# Patient Record
Sex: Male | Born: 1935 | Race: White | Hispanic: No | Smoking: Former smoker
Health system: Southern US, Community
[De-identification: ages and names within clinical notes are randomized; demographics above are authoritative.]

## PROBLEM LIST (undated history)

## (undated) DIAGNOSIS — Z9049 Acquired absence of other specified parts of digestive tract: Secondary | ICD-10-CM

## (undated) DIAGNOSIS — I639 Cerebral infarction, unspecified: Secondary | ICD-10-CM

## (undated) DIAGNOSIS — F329 Major depressive disorder, single episode, unspecified: Secondary | ICD-10-CM

## (undated) DIAGNOSIS — E785 Hyperlipidemia, unspecified: Secondary | ICD-10-CM

## (undated) DIAGNOSIS — I2510 Atherosclerotic heart disease of native coronary artery without angina pectoris: Secondary | ICD-10-CM

## (undated) DIAGNOSIS — R0989 Other specified symptoms and signs involving the circulatory and respiratory systems: Secondary | ICD-10-CM

## (undated) DIAGNOSIS — W19XXXA Unspecified fall, initial encounter: Secondary | ICD-10-CM

## (undated) DIAGNOSIS — I480 Paroxysmal atrial fibrillation: Secondary | ICD-10-CM

## (undated) DIAGNOSIS — F0390 Unspecified dementia without behavioral disturbance: Secondary | ICD-10-CM

## (undated) DIAGNOSIS — F32A Depression, unspecified: Secondary | ICD-10-CM

## (undated) DIAGNOSIS — I509 Heart failure, unspecified: Secondary | ICD-10-CM

## (undated) DIAGNOSIS — N189 Chronic kidney disease, unspecified: Secondary | ICD-10-CM

## (undated) DIAGNOSIS — N400 Enlarged prostate without lower urinary tract symptoms: Secondary | ICD-10-CM

## (undated) DIAGNOSIS — I10 Essential (primary) hypertension: Secondary | ICD-10-CM

## (undated) HISTORY — DX: Hyperlipidemia, unspecified: E78.5

## (undated) HISTORY — DX: Major depressive disorder, single episode, unspecified: F32.9

## (undated) HISTORY — DX: Chronic kidney disease, unspecified: N18.9

## (undated) HISTORY — DX: Atherosclerotic heart disease of native coronary artery without angina pectoris: I25.10

## (undated) HISTORY — PX: CORONARY ARTERY BYPASS GRAFT: SHX141

## (undated) HISTORY — DX: Acquired absence of other specified parts of digestive tract: Z90.49

## (undated) HISTORY — DX: Heart failure, unspecified: I50.9

## (undated) HISTORY — DX: Paroxysmal atrial fibrillation: I48.0

## (undated) HISTORY — DX: Essential (primary) hypertension: I10

## (undated) HISTORY — DX: Cerebral infarction, unspecified: I63.9

## (undated) HISTORY — DX: Unspecified fall, initial encounter: W19.XXXA

## (undated) HISTORY — DX: Depression, unspecified: F32.A

## (undated) HISTORY — DX: Benign prostatic hyperplasia without lower urinary tract symptoms: N40.0

## (undated) HISTORY — DX: Other specified symptoms and signs involving the circulatory and respiratory systems: R09.89

---

## 2003-04-22 ENCOUNTER — Emergency Department (HOSPITAL_COMMUNITY): Admission: EM | Admit: 2003-04-22 | Discharge: 2003-04-22 | Payer: Self-pay | Admitting: Emergency Medicine

## 2004-01-19 ENCOUNTER — Ambulatory Visit: Payer: Self-pay | Admitting: *Deleted

## 2004-02-06 ENCOUNTER — Emergency Department (HOSPITAL_COMMUNITY): Admission: EM | Admit: 2004-02-06 | Discharge: 2004-02-06 | Payer: Self-pay | Admitting: Emergency Medicine

## 2004-03-19 ENCOUNTER — Ambulatory Visit: Payer: Self-pay | Admitting: Internal Medicine

## 2004-05-03 ENCOUNTER — Ambulatory Visit: Payer: Self-pay | Admitting: Internal Medicine

## 2004-05-17 ENCOUNTER — Ambulatory Visit: Payer: Self-pay | Admitting: *Deleted

## 2004-08-18 ENCOUNTER — Ambulatory Visit: Payer: Self-pay | Admitting: Internal Medicine

## 2004-09-20 ENCOUNTER — Ambulatory Visit: Payer: Self-pay | Admitting: Internal Medicine

## 2004-09-27 ENCOUNTER — Ambulatory Visit: Payer: Self-pay

## 2004-11-18 ENCOUNTER — Ambulatory Visit: Payer: Self-pay | Admitting: Internal Medicine

## 2005-01-21 ENCOUNTER — Emergency Department (HOSPITAL_COMMUNITY): Admission: EM | Admit: 2005-01-21 | Discharge: 2005-01-21 | Payer: Self-pay | Admitting: Emergency Medicine

## 2005-04-04 ENCOUNTER — Ambulatory Visit: Payer: Self-pay | Admitting: Internal Medicine

## 2005-05-23 ENCOUNTER — Ambulatory Visit: Payer: Self-pay | Admitting: Internal Medicine

## 2005-06-28 ENCOUNTER — Ambulatory Visit: Payer: Self-pay | Admitting: Internal Medicine

## 2005-07-07 ENCOUNTER — Ambulatory Visit: Payer: Self-pay | Admitting: Internal Medicine

## 2005-08-30 ENCOUNTER — Ambulatory Visit: Payer: Self-pay | Admitting: Internal Medicine

## 2005-11-30 ENCOUNTER — Ambulatory Visit: Payer: Self-pay | Admitting: Internal Medicine

## 2005-12-22 ENCOUNTER — Ambulatory Visit: Payer: Self-pay | Admitting: Internal Medicine

## 2005-12-29 ENCOUNTER — Ambulatory Visit: Payer: Self-pay | Admitting: Internal Medicine

## 2006-01-13 ENCOUNTER — Ambulatory Visit: Payer: Self-pay | Admitting: Cardiology

## 2006-01-18 ENCOUNTER — Ambulatory Visit: Payer: Self-pay | Admitting: Endocrinology

## 2006-02-08 ENCOUNTER — Ambulatory Visit: Payer: Self-pay | Admitting: Endocrinology

## 2006-02-16 ENCOUNTER — Ambulatory Visit: Payer: Self-pay | Admitting: Cardiology

## 2006-02-24 ENCOUNTER — Ambulatory Visit: Payer: Self-pay | Admitting: Internal Medicine

## 2006-03-01 ENCOUNTER — Ambulatory Visit: Payer: Self-pay | Admitting: Endocrinology

## 2006-03-09 ENCOUNTER — Inpatient Hospital Stay (HOSPITAL_COMMUNITY): Admission: EM | Admit: 2006-03-09 | Discharge: 2006-03-15 | Payer: Self-pay | Admitting: Emergency Medicine

## 2006-03-09 ENCOUNTER — Ambulatory Visit: Payer: Self-pay | Admitting: Internal Medicine

## 2006-03-28 ENCOUNTER — Ambulatory Visit: Payer: Self-pay | Admitting: Internal Medicine

## 2006-03-28 LAB — CONVERTED CEMR LAB: Calcium, Total (PTH): 9.1 mg/dL (ref 8.4–10.5)

## 2006-04-03 ENCOUNTER — Ambulatory Visit: Payer: Self-pay | Admitting: Internal Medicine

## 2006-04-07 ENCOUNTER — Ambulatory Visit: Payer: Self-pay | Admitting: Internal Medicine

## 2006-04-07 LAB — CONVERTED CEMR LAB
ALT: 22 units/L (ref 0–40)
AST: 23 units/L (ref 0–37)
Alkaline Phosphatase: 40 units/L (ref 39–117)
BUN: 20 mg/dL (ref 6–23)
Bilirubin, Direct: 0.1 mg/dL (ref 0.0–0.3)
CO2: 25 meq/L (ref 19–32)
Calcium: 9.2 mg/dL (ref 8.4–10.5)
Chloride: 108 meq/L (ref 96–112)
Cholesterol: 109 mg/dL (ref 0–200)
GFR calc non Af Amer: 62 mL/min
Glucose, Bld: 168 mg/dL — ABNORMAL HIGH (ref 70–99)
Total Protein: 6.6 g/dL (ref 6.0–8.3)

## 2006-04-25 ENCOUNTER — Ambulatory Visit: Payer: Self-pay | Admitting: Endocrinology

## 2006-05-31 ENCOUNTER — Ambulatory Visit: Payer: Self-pay | Admitting: Endocrinology

## 2006-06-29 ENCOUNTER — Encounter: Payer: Self-pay | Admitting: Endocrinology

## 2006-06-29 DIAGNOSIS — I10 Essential (primary) hypertension: Secondary | ICD-10-CM | POA: Insufficient documentation

## 2006-06-29 DIAGNOSIS — E119 Type 2 diabetes mellitus without complications: Secondary | ICD-10-CM

## 2006-06-29 DIAGNOSIS — I679 Cerebrovascular disease, unspecified: Secondary | ICD-10-CM

## 2006-06-29 DIAGNOSIS — I509 Heart failure, unspecified: Secondary | ICD-10-CM | POA: Insufficient documentation

## 2006-06-30 ENCOUNTER — Ambulatory Visit: Payer: Self-pay | Admitting: Endocrinology

## 2006-07-07 ENCOUNTER — Ambulatory Visit: Payer: Self-pay | Admitting: Internal Medicine

## 2006-07-17 ENCOUNTER — Ambulatory Visit: Payer: Self-pay | Admitting: Endocrinology

## 2006-08-02 ENCOUNTER — Encounter: Payer: Self-pay | Admitting: Internal Medicine

## 2006-08-02 ENCOUNTER — Ambulatory Visit: Payer: Self-pay

## 2006-08-02 ENCOUNTER — Ambulatory Visit: Payer: Self-pay | Admitting: Internal Medicine

## 2006-08-02 LAB — CONVERTED CEMR LAB
ALT: 39 units/L (ref 0–40)
Albumin: 3.7 g/dL (ref 3.5–5.2)
Alkaline Phosphatase: 72 units/L (ref 39–117)
BUN: 24 mg/dL — ABNORMAL HIGH (ref 6–23)
CO2: 29 meq/L (ref 19–32)
Calcium: 9.3 mg/dL (ref 8.4–10.5)
GFR calc Af Amer: 62 mL/min
Potassium: 4 meq/L (ref 3.5–5.1)
Total CHOL/HDL Ratio: 4.4
Total Protein: 7.1 g/dL (ref 6.0–8.3)
VLDL: 29 mg/dL (ref 0–40)

## 2006-08-03 ENCOUNTER — Ambulatory Visit: Payer: Self-pay | Admitting: Internal Medicine

## 2006-08-08 ENCOUNTER — Ambulatory Visit: Payer: Self-pay | Admitting: Cardiology

## 2006-08-08 LAB — CONVERTED CEMR LAB
CO2: 28 meq/L (ref 19–32)
Calcium: 9.1 mg/dL (ref 8.4–10.5)
Chloride: 103 meq/L (ref 96–112)
Creatinine, Ser: 1.6 mg/dL — ABNORMAL HIGH (ref 0.4–1.5)
GFR calc non Af Amer: 44 mL/min
Glucose, Bld: 128 mg/dL — ABNORMAL HIGH (ref 70–99)
Sodium: 138 meq/L (ref 135–145)

## 2006-08-09 ENCOUNTER — Ambulatory Visit: Payer: Self-pay | Admitting: Endocrinology

## 2006-08-09 LAB — CONVERTED CEMR LAB
Hgb A1c MFr Bld: 7.8 % — ABNORMAL HIGH (ref 4.6–6.0)
Microalb Creat Ratio: 114.6 mg/g — ABNORMAL HIGH (ref 0.0–30.0)
Microalb, Ur: 7.3 mg/dL — ABNORMAL HIGH (ref 0.0–1.9)

## 2006-08-31 ENCOUNTER — Other Ambulatory Visit: Payer: Self-pay | Admitting: Emergency Medicine

## 2006-08-31 ENCOUNTER — Ambulatory Visit (HOSPITAL_COMMUNITY): Admission: RE | Admit: 2006-08-31 | Discharge: 2006-08-31 | Payer: Self-pay | Admitting: Urology

## 2006-09-01 ENCOUNTER — Ambulatory Visit: Payer: Self-pay | Admitting: Vascular Surgery

## 2006-09-01 ENCOUNTER — Ambulatory Visit: Payer: Self-pay | Admitting: Internal Medicine

## 2006-09-01 ENCOUNTER — Encounter (INDEPENDENT_AMBULATORY_CARE_PROVIDER_SITE_OTHER): Payer: Self-pay | Admitting: Internal Medicine

## 2006-09-01 ENCOUNTER — Observation Stay (HOSPITAL_COMMUNITY): Admission: EM | Admit: 2006-09-01 | Discharge: 2006-09-01 | Payer: Self-pay | Admitting: Internal Medicine

## 2006-09-19 ENCOUNTER — Ambulatory Visit: Payer: Self-pay | Admitting: Cardiology

## 2006-10-06 ENCOUNTER — Ambulatory Visit: Payer: Self-pay | Admitting: Internal Medicine

## 2006-10-06 LAB — CONVERTED CEMR LAB
ALT: 14 units/L (ref 0–53)
AST: 19 units/L (ref 0–37)
Bilirubin, Direct: 0.1 mg/dL (ref 0.0–0.3)
Cholesterol: 95 mg/dL (ref 0–200)
HDL: 38.6 mg/dL — ABNORMAL LOW (ref >39.0)
Total Bilirubin: 0.6 mg/dL (ref 0.3–1.2)
Triglycerides: 72 mg/dL (ref 0–149)
VLDL: 14 mg/dL (ref 0–40)

## 2006-10-09 ENCOUNTER — Ambulatory Visit (HOSPITAL_BASED_OUTPATIENT_CLINIC_OR_DEPARTMENT_OTHER): Admission: RE | Admit: 2006-10-09 | Discharge: 2006-10-10 | Payer: Self-pay | Admitting: Urology

## 2006-10-18 ENCOUNTER — Ambulatory Visit: Payer: Self-pay | Admitting: Internal Medicine

## 2006-10-23 ENCOUNTER — Ambulatory Visit: Payer: Self-pay

## 2006-10-23 ENCOUNTER — Ambulatory Visit: Payer: Self-pay | Admitting: Cardiology

## 2006-10-27 ENCOUNTER — Ambulatory Visit: Payer: Self-pay | Admitting: Cardiology

## 2006-11-03 ENCOUNTER — Ambulatory Visit: Payer: Self-pay | Admitting: Cardiology

## 2006-11-16 ENCOUNTER — Ambulatory Visit: Payer: Self-pay | Admitting: Endocrinology

## 2006-11-16 ENCOUNTER — Encounter: Payer: Self-pay | Admitting: Endocrinology

## 2006-11-16 LAB — CONVERTED CEMR LAB: Hgb A1c MFr Bld: 7.7 % — ABNORMAL HIGH (ref 4.6–6.0)

## 2006-11-17 ENCOUNTER — Ambulatory Visit: Payer: Self-pay | Admitting: Internal Medicine

## 2006-11-23 ENCOUNTER — Ambulatory Visit: Payer: Self-pay | Admitting: Cardiology

## 2006-11-23 LAB — CONVERTED CEMR LAB
BUN: 20 mg/dL (ref 6–23)
CO2: 30 meq/L (ref 19–32)
Creatinine, Ser: 1.5 mg/dL (ref 0.4–1.5)
GFR calc Af Amer: 58 mL/min
Glucose, Bld: 112 mg/dL — ABNORMAL HIGH (ref 70–99)
Potassium: 3.8 meq/L (ref 3.5–5.1)

## 2006-12-08 ENCOUNTER — Ambulatory Visit: Payer: Self-pay | Admitting: Cardiovascular Disease

## 2006-12-20 ENCOUNTER — Ambulatory Visit: Payer: Self-pay | Admitting: Cardiology

## 2007-01-03 ENCOUNTER — Ambulatory Visit: Payer: Self-pay | Admitting: Cardiology

## 2007-01-19 ENCOUNTER — Ambulatory Visit: Payer: Self-pay | Admitting: Internal Medicine

## 2007-01-19 ENCOUNTER — Ambulatory Visit: Payer: Self-pay | Admitting: Cardiology

## 2007-01-24 ENCOUNTER — Ambulatory Visit: Payer: Self-pay | Admitting: Internal Medicine

## 2007-01-24 LAB — CONVERTED CEMR LAB
Albumin: 3.6 g/dL (ref 3.5–5.2)
Bilirubin, Direct: 0.2 mg/dL (ref 0.0–0.3)
Calcium: 9.4 mg/dL (ref 8.4–10.5)
Chloride: 95 meq/L — ABNORMAL LOW (ref 96–112)
Cholesterol: 120 mg/dL (ref 0–200)
GFR calc Af Amer: 44 mL/min
GFR calc non Af Amer: 36 mL/min
Glucose, Bld: 169 mg/dL — ABNORMAL HIGH (ref 70–99)
HDL: 40.4 mg/dL (ref >39.0)
LDL Cholesterol: 53 mg/dL (ref 0–99)
Sodium: 137 meq/L (ref 135–145)
Total CHOL/HDL Ratio: 3
VLDL: 27 mg/dL (ref 0–40)

## 2007-02-05 ENCOUNTER — Ambulatory Visit: Payer: Self-pay | Admitting: Cardiology

## 2007-02-05 ENCOUNTER — Ambulatory Visit: Payer: Self-pay | Admitting: Internal Medicine

## 2007-02-05 LAB — CONVERTED CEMR LAB
BUN: 33 mg/dL — ABNORMAL HIGH (ref 6–23)
CO2: 30 meq/L (ref 19–32)
Chloride: 99 meq/L (ref 96–112)
Creatinine, Ser: 1.7 mg/dL — ABNORMAL HIGH (ref 0.4–1.5)

## 2007-02-20 ENCOUNTER — Ambulatory Visit: Payer: Self-pay | Admitting: Endocrinology

## 2007-02-21 LAB — CONVERTED CEMR LAB: Hgb A1c MFr Bld: 8 % — ABNORMAL HIGH (ref 4.6–6.0)

## 2007-02-26 ENCOUNTER — Ambulatory Visit: Payer: Self-pay | Admitting: Cardiology

## 2007-03-08 ENCOUNTER — Ambulatory Visit: Payer: Self-pay | Admitting: Internal Medicine

## 2007-03-08 LAB — CONVERTED CEMR LAB
BUN: 24 mg/dL — ABNORMAL HIGH (ref 6–23)
CO2: 30 meq/L (ref 19–32)
Calcium: 9.3 mg/dL (ref 8.4–10.5)
Chloride: 104 meq/L (ref 96–112)
Creatinine, Ser: 2 mg/dL — ABNORMAL HIGH (ref 0.4–1.5)
Pro B Natriuretic peptide (BNP): 153 pg/mL — ABNORMAL HIGH (ref 0.0–100.0)

## 2007-03-12 ENCOUNTER — Ambulatory Visit: Payer: Self-pay | Admitting: Cardiology

## 2007-03-15 ENCOUNTER — Ambulatory Visit: Payer: Self-pay | Admitting: Internal Medicine

## 2007-03-15 LAB — CONVERTED CEMR LAB
BUN: 23 mg/dL (ref 6–23)
Calcium: 9.5 mg/dL (ref 8.4–10.5)
Chloride: 103 meq/L (ref 96–112)
Creatinine, Ser: 1.5 mg/dL (ref 0.4–1.5)
Potassium: 3.7 meq/L (ref 3.5–5.1)
Pro B Natriuretic peptide (BNP): 125 pg/mL — ABNORMAL HIGH (ref 0.0–100.0)

## 2007-03-20 ENCOUNTER — Ambulatory Visit: Payer: Self-pay | Admitting: Internal Medicine

## 2007-03-20 ENCOUNTER — Inpatient Hospital Stay (HOSPITAL_COMMUNITY): Admission: AD | Admit: 2007-03-20 | Discharge: 2007-03-23 | Payer: Self-pay | Admitting: Internal Medicine

## 2007-03-21 ENCOUNTER — Encounter: Payer: Self-pay | Admitting: Internal Medicine

## 2007-03-27 ENCOUNTER — Ambulatory Visit: Payer: Self-pay | Admitting: Internal Medicine

## 2007-03-27 ENCOUNTER — Inpatient Hospital Stay (HOSPITAL_COMMUNITY): Admission: RE | Admit: 2007-03-27 | Discharge: 2007-03-28 | Payer: Self-pay | Admitting: Internal Medicine

## 2007-04-03 ENCOUNTER — Ambulatory Visit: Payer: Self-pay | Admitting: Endocrinology

## 2007-04-05 ENCOUNTER — Ambulatory Visit: Payer: Self-pay | Admitting: Cardiology

## 2007-04-08 ENCOUNTER — Encounter: Payer: Self-pay | Admitting: Endocrinology

## 2007-04-11 ENCOUNTER — Ambulatory Visit: Payer: Self-pay | Admitting: Internal Medicine

## 2007-04-11 LAB — CONVERTED CEMR LAB
BUN: 34 mg/dL — ABNORMAL HIGH (ref 6–23)
Calcium: 9.5 mg/dL (ref 8.4–10.5)
Chloride: 103 meq/L (ref 96–112)
Creatinine, Ser: 2.3 mg/dL — ABNORMAL HIGH (ref 0.4–1.5)
Potassium: 4.3 meq/L (ref 3.5–5.1)
Pro B Natriuretic peptide (BNP): 146 pg/mL — ABNORMAL HIGH (ref 0.0–100.0)

## 2007-04-25 ENCOUNTER — Ambulatory Visit: Payer: Self-pay | Admitting: Cardiology

## 2007-04-25 ENCOUNTER — Ambulatory Visit: Payer: Self-pay | Admitting: Internal Medicine

## 2007-04-25 LAB — CONVERTED CEMR LAB
BUN: 25 mg/dL — ABNORMAL HIGH (ref 6–23)
Calcium: 9.6 mg/dL (ref 8.4–10.5)
Creatinine, Ser: 1.7 mg/dL — ABNORMAL HIGH (ref 0.4–1.5)
GFR calc Af Amer: 50 mL/min
Potassium: 4.1 meq/L (ref 3.5–5.1)
Pro B Natriuretic peptide (BNP): 243 pg/mL — ABNORMAL HIGH (ref 0.0–100.0)

## 2007-05-10 ENCOUNTER — Ambulatory Visit: Payer: Self-pay | Admitting: Internal Medicine

## 2007-05-17 ENCOUNTER — Ambulatory Visit: Payer: Self-pay | Admitting: Internal Medicine

## 2007-05-17 LAB — CONVERTED CEMR LAB
AST: 18 units/L (ref 0–37)
Albumin: 4 g/dL (ref 3.5–5.2)
Alkaline Phosphatase: 35 units/L — ABNORMAL LOW (ref 39–117)
BUN: 46 mg/dL — ABNORMAL HIGH (ref 6–23)
Chloride: 101 meq/L (ref 96–112)
Cholesterol: 100 mg/dL (ref 0–200)
Glucose, Bld: 107 mg/dL — ABNORMAL HIGH (ref 70–99)
Potassium: 4.2 meq/L (ref 3.5–5.1)
Total Protein: 7.2 g/dL (ref 6.0–8.3)
VLDL: 19 mg/dL (ref 0–40)

## 2007-06-01 ENCOUNTER — Ambulatory Visit: Payer: Self-pay | Admitting: Internal Medicine

## 2007-06-01 ENCOUNTER — Ambulatory Visit: Payer: Self-pay

## 2007-06-01 LAB — CONVERTED CEMR LAB
BUN: 34 mg/dL — ABNORMAL HIGH (ref 6–23)
Creatinine, Ser: 2.1 mg/dL — ABNORMAL HIGH (ref 0.4–1.5)
GFR calc Af Amer: 39 mL/min
GFR calc non Af Amer: 32 mL/min

## 2007-06-04 ENCOUNTER — Emergency Department (HOSPITAL_COMMUNITY): Admission: EM | Admit: 2007-06-04 | Discharge: 2007-06-05 | Payer: Self-pay | Admitting: Emergency Medicine

## 2007-06-12 ENCOUNTER — Ambulatory Visit: Payer: Self-pay | Admitting: Internal Medicine

## 2007-06-12 ENCOUNTER — Ambulatory Visit: Payer: Self-pay | Admitting: Cardiology

## 2007-06-22 ENCOUNTER — Ambulatory Visit: Payer: Self-pay | Admitting: Cardiology

## 2007-06-22 LAB — CONVERTED CEMR LAB
Calcium: 9.4 mg/dL (ref 8.4–10.5)
Creatinine, Ser: 1.7 mg/dL — ABNORMAL HIGH (ref 0.4–1.5)
GFR calc non Af Amer: 41 mL/min
Pro B Natriuretic peptide (BNP): 329 pg/mL — ABNORMAL HIGH (ref 0.0–100.0)

## 2007-07-02 ENCOUNTER — Ambulatory Visit: Payer: Self-pay | Admitting: Cardiology

## 2007-07-11 ENCOUNTER — Ambulatory Visit: Payer: Self-pay | Admitting: Internal Medicine

## 2007-08-07 ENCOUNTER — Ambulatory Visit: Payer: Self-pay | Admitting: Cardiovascular Disease

## 2007-08-27 ENCOUNTER — Ambulatory Visit: Payer: Self-pay | Admitting: Internal Medicine

## 2007-08-27 LAB — CONVERTED CEMR LAB
CO2: 31 meq/L (ref 19–32)
Calcium: 9.2 mg/dL (ref 8.4–10.5)
Creatinine, Ser: 1.4 mg/dL (ref 0.4–1.5)
GFR calc non Af Amer: 51 mL/min
Pro B Natriuretic peptide (BNP): 237 pg/mL — ABNORMAL HIGH (ref 0.0–100.0)

## 2007-09-06 ENCOUNTER — Ambulatory Visit: Payer: Self-pay | Admitting: Internal Medicine

## 2007-09-07 LAB — CONVERTED CEMR LAB
BUN: 31 mg/dL — ABNORMAL HIGH (ref 6–23)
CO2: 26 meq/L (ref 19–32)
Calcium: 9.6 mg/dL (ref 8.4–10.5)
Creatinine, Ser: 1.4 mg/dL (ref 0.4–1.5)
Pro B Natriuretic peptide (BNP): 168 pg/mL — ABNORMAL HIGH (ref 0.0–100.0)

## 2007-09-18 ENCOUNTER — Ambulatory Visit: Payer: Self-pay

## 2007-09-18 ENCOUNTER — Ambulatory Visit: Payer: Self-pay | Admitting: Cardiology

## 2007-09-18 ENCOUNTER — Ambulatory Visit: Payer: Self-pay | Admitting: Internal Medicine

## 2007-09-18 LAB — CONVERTED CEMR LAB
Albumin: 3.9 g/dL (ref 3.5–5.2)
Alkaline Phosphatase: 41 units/L (ref 39–117)
Bilirubin, Direct: 0.1 mg/dL (ref 0.0–0.3)
GFR calc Af Amer: 62 mL/min
GFR calc non Af Amer: 51 mL/min
Glucose, Bld: 106 mg/dL — ABNORMAL HIGH (ref 70–99)
Potassium: 3.8 meq/L (ref 3.5–5.1)
Sodium: 143 meq/L (ref 135–145)
Total Bilirubin: 0.9 mg/dL (ref 0.3–1.2)
Total CHOL/HDL Ratio: 2.7
VLDL: 19 mg/dL (ref 0–40)

## 2007-10-18 ENCOUNTER — Ambulatory Visit: Payer: Self-pay | Admitting: Cardiology

## 2007-11-13 ENCOUNTER — Ambulatory Visit: Payer: Self-pay | Admitting: Internal Medicine

## 2007-12-12 ENCOUNTER — Ambulatory Visit: Payer: Self-pay | Admitting: Cardiovascular Disease

## 2007-12-12 LAB — CONVERTED CEMR LAB
BUN: 25 mg/dL — ABNORMAL HIGH (ref 6–23)
CO2: 32 meq/L (ref 19–32)
Chloride: 103 meq/L (ref 96–112)
Creatinine, Ser: 1.4 mg/dL (ref 0.4–1.5)
Glucose, Bld: 234 mg/dL — ABNORMAL HIGH (ref 70–99)
Potassium: 4 meq/L (ref 3.5–5.1)

## 2007-12-21 ENCOUNTER — Ambulatory Visit: Payer: Self-pay | Admitting: Cardiology

## 2007-12-21 LAB — CONVERTED CEMR LAB
BUN: 22 mg/dL (ref 6–23)
CO2: 29 meq/L (ref 19–32)
Chloride: 105 meq/L (ref 96–112)
Creatinine, Ser: 1.3 mg/dL (ref 0.4–1.5)
Potassium: 4.2 meq/L (ref 3.5–5.1)

## 2008-01-01 ENCOUNTER — Ambulatory Visit: Payer: Self-pay | Admitting: Cardiovascular Disease

## 2008-01-21 ENCOUNTER — Ambulatory Visit: Payer: Self-pay | Admitting: Internal Medicine

## 2008-02-21 ENCOUNTER — Ambulatory Visit: Payer: Self-pay | Admitting: Cardiovascular Disease

## 2008-02-21 ENCOUNTER — Ambulatory Visit: Payer: Self-pay | Admitting: Internal Medicine

## 2008-02-25 ENCOUNTER — Emergency Department (HOSPITAL_COMMUNITY): Admission: EM | Admit: 2008-02-25 | Discharge: 2008-02-25 | Payer: Self-pay | Admitting: Emergency Medicine

## 2008-02-25 ENCOUNTER — Ambulatory Visit: Payer: Self-pay | Admitting: Internal Medicine

## 2008-03-06 ENCOUNTER — Ambulatory Visit: Payer: Self-pay | Admitting: Cardiology

## 2008-03-06 LAB — CONVERTED CEMR LAB
Bilirubin Urine: NEGATIVE
Ketones, ur: NEGATIVE mg/dL
Nitrite: NEGATIVE
Specific Gravity, Urine: 1.015 (ref 1.000–1.03)
Total Protein, Urine: NEGATIVE mg/dL
pH: 5 (ref 5.0–8.0)

## 2008-03-27 ENCOUNTER — Ambulatory Visit: Payer: Self-pay | Admitting: Cardiology

## 2008-04-23 ENCOUNTER — Ambulatory Visit: Payer: Self-pay | Admitting: Cardiovascular Disease

## 2008-04-23 LAB — CONVERTED CEMR LAB
BUN: 34 mg/dL — ABNORMAL HIGH (ref 6–23)
Basophils Relative: 0.9 % (ref 0.0–3.0)
Chloride: 103 meq/L (ref 96–112)
Creatinine, Ser: 2.2 mg/dL — ABNORMAL HIGH (ref 0.4–1.5)
Eosinophils Relative: 0.9 % (ref 0.0–5.0)
GFR calc Af Amer: 37 mL/min
GFR calc non Af Amer: 31 mL/min
HCT: 36 % — ABNORMAL LOW (ref 39.0–52.0)
INR: 4.4 — ABNORMAL HIGH (ref 0.8–1.0)
MCV: 84.4 fL (ref 78.0–100.0)
Monocytes Relative: 5.1 % (ref 3.0–12.0)
Neutrophils Relative %: 73.3 % (ref 43.0–77.0)
Platelets: 70 10*3/uL — ABNORMAL LOW (ref 150–400)
RBC: 4.27 M/uL (ref 4.22–5.81)
WBC: 10.8 10*3/uL — ABNORMAL HIGH (ref 4.5–10.5)

## 2008-07-15 ENCOUNTER — Encounter: Payer: Self-pay | Admitting: Cardiology

## 2008-07-15 ENCOUNTER — Encounter: Payer: Self-pay | Admitting: *Deleted

## 2008-07-15 LAB — CONVERTED CEMR LAB: INR: 2.1

## 2008-08-12 ENCOUNTER — Encounter: Payer: Self-pay | Admitting: Cardiology

## 2008-08-19 ENCOUNTER — Encounter: Payer: Self-pay | Admitting: Cardiology

## 2008-08-19 LAB — CONVERTED CEMR LAB: Prothrombin Time: 29.7 s

## 2008-08-20 ENCOUNTER — Encounter: Payer: Self-pay | Admitting: *Deleted

## 2008-08-22 ENCOUNTER — Telehealth (INDEPENDENT_AMBULATORY_CARE_PROVIDER_SITE_OTHER): Payer: Self-pay | Admitting: Nurse Practitioner

## 2008-08-27 ENCOUNTER — Encounter: Payer: Self-pay | Admitting: Internal Medicine

## 2008-08-27 LAB — CONVERTED CEMR LAB: POC INR: 2.9

## 2008-09-03 ENCOUNTER — Ambulatory Visit: Payer: Self-pay | Admitting: Internal Medicine

## 2008-09-03 ENCOUNTER — Encounter: Payer: Self-pay | Admitting: Physician Assistant

## 2008-09-03 ENCOUNTER — Ambulatory Visit: Payer: Self-pay | Admitting: Physician Assistant

## 2008-09-10 ENCOUNTER — Encounter: Payer: Self-pay | Admitting: Cardiology

## 2008-09-10 ENCOUNTER — Telehealth (INDEPENDENT_AMBULATORY_CARE_PROVIDER_SITE_OTHER): Payer: Self-pay | Admitting: *Deleted

## 2008-09-10 LAB — CONVERTED CEMR LAB
INR: 1.9
Prothrombin Time: 19.4 s

## 2008-09-19 ENCOUNTER — Encounter: Payer: Self-pay | Admitting: Internal Medicine

## 2008-09-22 ENCOUNTER — Ambulatory Visit: Payer: Self-pay | Admitting: Internal Medicine

## 2008-09-23 LAB — CONVERTED CEMR LAB
BUN: 30 mg/dL — ABNORMAL HIGH (ref 6–23)
Chloride: 101 meq/L (ref 96–112)
GFR calc non Af Amer: 32.12 mL/min (ref >60)
Potassium: 4.1 meq/L (ref 3.5–5.1)
Pro B Natriuretic peptide (BNP): 173 pg/mL — ABNORMAL HIGH (ref 0.0–100.0)
Sodium: 140 meq/L (ref 135–145)

## 2008-09-24 ENCOUNTER — Encounter: Payer: Self-pay | Admitting: Internal Medicine

## 2008-09-24 LAB — CONVERTED CEMR LAB
POC INR: 2.3
Prothrombin Time: 22.5 s

## 2008-09-26 ENCOUNTER — Telehealth: Payer: Self-pay | Admitting: Internal Medicine

## 2008-10-08 ENCOUNTER — Telehealth (INDEPENDENT_AMBULATORY_CARE_PROVIDER_SITE_OTHER): Payer: Self-pay | Admitting: *Deleted

## 2008-10-08 ENCOUNTER — Encounter: Payer: Self-pay | Admitting: Internal Medicine

## 2008-10-08 LAB — CONVERTED CEMR LAB
POC INR: 2.5
Prothrombin Time: 25 s

## 2008-10-13 ENCOUNTER — Telehealth: Payer: Self-pay | Admitting: Internal Medicine

## 2008-10-22 ENCOUNTER — Ambulatory Visit: Payer: Self-pay | Admitting: Internal Medicine

## 2008-10-30 ENCOUNTER — Ambulatory Visit: Payer: Self-pay | Admitting: Cardiology

## 2008-11-04 ENCOUNTER — Telehealth: Payer: Self-pay | Admitting: Internal Medicine

## 2008-11-27 ENCOUNTER — Ambulatory Visit: Payer: Self-pay | Admitting: Cardiology

## 2008-12-02 ENCOUNTER — Telehealth: Payer: Self-pay | Admitting: Internal Medicine

## 2008-12-15 ENCOUNTER — Emergency Department (HOSPITAL_COMMUNITY): Admission: EM | Admit: 2008-12-15 | Discharge: 2008-12-15 | Payer: Self-pay | Admitting: Emergency Medicine

## 2008-12-16 ENCOUNTER — Ambulatory Visit: Payer: Self-pay | Admitting: Internal Medicine

## 2008-12-16 DIAGNOSIS — I4891 Unspecified atrial fibrillation: Secondary | ICD-10-CM

## 2008-12-16 DIAGNOSIS — I6529 Occlusion and stenosis of unspecified carotid artery: Secondary | ICD-10-CM

## 2008-12-16 DIAGNOSIS — I25810 Atherosclerosis of coronary artery bypass graft(s) without angina pectoris: Secondary | ICD-10-CM | POA: Insufficient documentation

## 2008-12-22 ENCOUNTER — Emergency Department (HOSPITAL_COMMUNITY): Admission: EM | Admit: 2008-12-22 | Discharge: 2008-12-22 | Payer: Self-pay | Admitting: Emergency Medicine

## 2008-12-23 ENCOUNTER — Encounter: Payer: Self-pay | Admitting: Cardiology

## 2008-12-23 ENCOUNTER — Telehealth: Payer: Self-pay | Admitting: Internal Medicine

## 2008-12-23 LAB — CONVERTED CEMR LAB: Prothrombin Time: 46.3 s

## 2008-12-31 ENCOUNTER — Encounter: Payer: Self-pay | Admitting: Cardiovascular Disease

## 2008-12-31 LAB — CONVERTED CEMR LAB: Prothrombin Time: 18.4 s

## 2009-01-06 ENCOUNTER — Encounter (INDEPENDENT_AMBULATORY_CARE_PROVIDER_SITE_OTHER): Payer: Self-pay | Admitting: *Deleted

## 2009-01-14 ENCOUNTER — Encounter: Payer: Self-pay | Admitting: Cardiology

## 2009-01-14 LAB — CONVERTED CEMR LAB
POC INR: 4.1
Prothrombin Time: 41 s

## 2009-01-21 ENCOUNTER — Ambulatory Visit: Payer: Self-pay | Admitting: Cardiology

## 2009-03-02 ENCOUNTER — Encounter: Admission: RE | Admit: 2009-03-02 | Discharge: 2009-03-02 | Payer: Self-pay | Admitting: Neurosurgery

## 2009-03-10 ENCOUNTER — Ambulatory Visit: Payer: Self-pay | Admitting: Internal Medicine

## 2009-03-10 ENCOUNTER — Encounter: Payer: Self-pay | Admitting: Nurse Practitioner

## 2009-03-10 ENCOUNTER — Ambulatory Visit: Payer: Self-pay | Admitting: Cardiology

## 2009-03-12 LAB — CONVERTED CEMR LAB
Calcium: 9.5 mg/dL (ref 8.4–10.5)
GFR calc non Af Amer: 38.34 mL/min (ref >60)
Glucose, Bld: 166 mg/dL — ABNORMAL HIGH (ref 70–99)
Potassium: 4.1 meq/L (ref 3.5–5.1)
Pro B Natriuretic peptide (BNP): 149 pg/mL — ABNORMAL HIGH (ref 0.0–100.0)
Sodium: 139 meq/L (ref 135–145)

## 2009-05-04 ENCOUNTER — Encounter: Admission: RE | Admit: 2009-05-04 | Discharge: 2009-05-04 | Payer: Self-pay | Admitting: Neurosurgery

## 2009-05-14 ENCOUNTER — Ambulatory Visit: Payer: Self-pay | Admitting: Internal Medicine

## 2009-05-20 ENCOUNTER — Telehealth (INDEPENDENT_AMBULATORY_CARE_PROVIDER_SITE_OTHER): Payer: Self-pay | Admitting: *Deleted

## 2009-05-21 ENCOUNTER — Ambulatory Visit: Payer: Self-pay | Admitting: Cardiology

## 2009-05-21 ENCOUNTER — Encounter (HOSPITAL_COMMUNITY): Admission: RE | Admit: 2009-05-21 | Discharge: 2009-07-15 | Payer: Self-pay | Admitting: Internal Medicine

## 2009-05-21 ENCOUNTER — Ambulatory Visit: Payer: Self-pay

## 2009-05-27 ENCOUNTER — Telehealth: Payer: Self-pay | Admitting: Cardiology

## 2009-07-08 ENCOUNTER — Telehealth: Payer: Self-pay | Admitting: Internal Medicine

## 2009-07-09 ENCOUNTER — Encounter: Payer: Self-pay | Admitting: Internal Medicine

## 2009-08-11 ENCOUNTER — Ambulatory Visit: Payer: Self-pay | Admitting: Internal Medicine

## 2009-08-14 ENCOUNTER — Telehealth: Payer: Self-pay | Admitting: Internal Medicine

## 2009-08-14 LAB — CONVERTED CEMR LAB
BUN: 18 mg/dL (ref 6–23)
Basophils Relative: 0.4 % (ref 0.0–3.0)
Creatinine, Ser: 1.5 mg/dL (ref 0.4–1.5)
Eosinophils Relative: 1.2 % (ref 0.0–5.0)
GFR calc non Af Amer: 48.38 mL/min (ref >60)
HCT: 35.8 % — ABNORMAL LOW (ref 39.0–52.0)
Monocytes Relative: 6.1 % (ref 3.0–12.0)
Neutrophils Relative %: 76.5 % (ref 43.0–77.0)
Platelets: 143 10*3/uL — ABNORMAL LOW (ref 150.0–400.0)
Potassium: 4.1 meq/L (ref 3.5–5.1)
RBC: 4.07 M/uL — ABNORMAL LOW (ref 4.22–5.81)
WBC: 9 10*3/uL (ref 4.5–10.5)

## 2009-08-24 ENCOUNTER — Telehealth (INDEPENDENT_AMBULATORY_CARE_PROVIDER_SITE_OTHER): Payer: Self-pay | Admitting: *Deleted

## 2009-08-24 ENCOUNTER — Ambulatory Visit: Payer: Self-pay | Admitting: Internal Medicine

## 2009-08-24 LAB — CONVERTED CEMR LAB: POC INR: 1.5

## 2009-09-04 ENCOUNTER — Ambulatory Visit: Payer: Self-pay | Admitting: Cardiology

## 2009-09-16 ENCOUNTER — Encounter: Payer: Self-pay | Admitting: Cardiology

## 2009-09-16 LAB — CONVERTED CEMR LAB: POC INR: 4.2

## 2009-09-23 ENCOUNTER — Encounter: Payer: Self-pay | Admitting: Internal Medicine

## 2009-09-23 ENCOUNTER — Encounter: Payer: Self-pay | Admitting: Cardiology

## 2009-09-23 LAB — CONVERTED CEMR LAB: Prothrombin Time: 28.2 s

## 2009-09-30 ENCOUNTER — Encounter: Payer: Self-pay | Admitting: Cardiology

## 2009-09-30 LAB — CONVERTED CEMR LAB: Prothrombin Time: 24.7 s

## 2009-10-14 ENCOUNTER — Encounter: Payer: Self-pay | Admitting: Cardiovascular Disease

## 2009-10-23 ENCOUNTER — Encounter: Payer: Self-pay | Admitting: Internal Medicine

## 2009-10-30 ENCOUNTER — Encounter: Payer: Self-pay | Admitting: Internal Medicine

## 2009-11-02 ENCOUNTER — Encounter: Payer: Self-pay | Admitting: Cardiology

## 2009-11-06 ENCOUNTER — Encounter: Payer: Self-pay | Admitting: Internal Medicine

## 2009-11-13 ENCOUNTER — Encounter: Payer: Self-pay | Admitting: Cardiology

## 2009-11-13 ENCOUNTER — Encounter: Payer: Self-pay | Admitting: Internal Medicine

## 2009-11-16 ENCOUNTER — Encounter: Payer: Self-pay | Admitting: Cardiology

## 2009-11-20 ENCOUNTER — Encounter: Payer: Self-pay | Admitting: Cardiology

## 2009-11-20 ENCOUNTER — Encounter: Payer: Self-pay | Admitting: Internal Medicine

## 2009-11-23 ENCOUNTER — Encounter: Payer: Self-pay | Admitting: Cardiology

## 2009-11-27 ENCOUNTER — Encounter: Payer: Self-pay | Admitting: Internal Medicine

## 2009-11-27 ENCOUNTER — Encounter: Payer: Self-pay | Admitting: Cardiology

## 2009-12-04 ENCOUNTER — Encounter: Payer: Self-pay | Admitting: Internal Medicine

## 2009-12-04 LAB — CONVERTED CEMR LAB: POC INR: 1.5

## 2009-12-11 ENCOUNTER — Encounter: Payer: Self-pay | Admitting: Internal Medicine

## 2009-12-11 ENCOUNTER — Encounter: Payer: Self-pay | Admitting: Cardiovascular Disease

## 2009-12-11 LAB — CONVERTED CEMR LAB: POC INR: 2.4

## 2009-12-14 ENCOUNTER — Encounter: Payer: Self-pay | Admitting: Cardiovascular Disease

## 2009-12-18 ENCOUNTER — Encounter: Payer: Self-pay | Admitting: Internal Medicine

## 2009-12-18 ENCOUNTER — Encounter: Payer: Self-pay | Admitting: Cardiology

## 2009-12-25 ENCOUNTER — Encounter: Payer: Self-pay | Admitting: Cardiology

## 2009-12-25 ENCOUNTER — Encounter: Payer: Self-pay | Admitting: Internal Medicine

## 2009-12-25 LAB — CONVERTED CEMR LAB: POC INR: 3.3

## 2010-01-01 ENCOUNTER — Encounter: Payer: Self-pay | Admitting: Cardiology

## 2010-01-01 LAB — CONVERTED CEMR LAB: POC INR: 4.1

## 2010-01-11 ENCOUNTER — Encounter: Payer: Self-pay | Admitting: Internal Medicine

## 2010-01-11 ENCOUNTER — Encounter: Payer: Self-pay | Admitting: Cardiology

## 2010-01-15 ENCOUNTER — Encounter: Payer: Self-pay | Admitting: Internal Medicine

## 2010-01-15 ENCOUNTER — Encounter: Payer: Self-pay | Admitting: Cardiovascular Disease

## 2010-01-18 ENCOUNTER — Encounter: Payer: Self-pay | Admitting: Cardiovascular Disease

## 2010-01-22 ENCOUNTER — Encounter: Payer: Self-pay | Admitting: Internal Medicine

## 2010-01-22 LAB — CONVERTED CEMR LAB: POC INR: 1.8

## 2010-01-29 ENCOUNTER — Encounter: Payer: Self-pay | Admitting: Internal Medicine

## 2010-01-29 ENCOUNTER — Encounter: Payer: Self-pay | Admitting: Cardiology

## 2010-02-01 ENCOUNTER — Encounter: Payer: Self-pay | Admitting: Cardiology

## 2010-02-05 ENCOUNTER — Encounter: Payer: Self-pay | Admitting: Cardiology

## 2010-02-05 LAB — CONVERTED CEMR LAB: POC INR: 3.4

## 2010-02-11 ENCOUNTER — Encounter: Payer: Self-pay | Admitting: Internal Medicine

## 2010-02-11 ENCOUNTER — Encounter: Payer: Self-pay | Admitting: Cardiology

## 2010-02-11 LAB — CONVERTED CEMR LAB: POC INR: 2.6

## 2010-02-19 ENCOUNTER — Encounter: Payer: Self-pay | Admitting: Internal Medicine

## 2010-02-19 LAB — CONVERTED CEMR LAB: POC INR: 3.4

## 2010-02-26 ENCOUNTER — Encounter (INDEPENDENT_AMBULATORY_CARE_PROVIDER_SITE_OTHER): Payer: Self-pay | Admitting: Pharmacist

## 2010-02-26 ENCOUNTER — Encounter: Payer: Self-pay | Admitting: Cardiology

## 2010-02-26 LAB — CONVERTED CEMR LAB: POC INR: 2.2

## 2010-03-05 ENCOUNTER — Encounter: Payer: Self-pay | Admitting: Internal Medicine

## 2010-03-05 ENCOUNTER — Encounter: Payer: Self-pay | Admitting: Cardiology

## 2010-03-07 ENCOUNTER — Encounter: Payer: Self-pay | Admitting: Neurosurgery

## 2010-03-12 ENCOUNTER — Encounter: Payer: Self-pay | Admitting: Internal Medicine

## 2010-03-12 LAB — CONVERTED CEMR LAB: POC INR: 1.5

## 2010-03-16 NOTE — Assessment & Plan Note (Signed)
Summary: 3 [email or web address]   Referring Provider:  Ariyonna Twichell Primary Provider:  Wyline Mood, [facility]  CC:  ROV; No acute complaints.  History of Present Illness: Wayne Montoya is a 77 year old white male patient with atrial fibrillation, HIT, HTN, previous CVA, CHF secondary to diastolic dysfunction. He has CAD and is s/p CABG in 1999. 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   Myoview 4/11: EF 61% no ischemia or infarct.  He fell and suffered a cervical fracture back in November and is confined by a cervical brace still.   Unfortunately the site has not been healing well and now requires either weaqring a lifelong collar or surgery with Dr. Marikay Alar. He has decided to stay in collar.   Recently in [facility] last month for GIB in setting of INR. Found to have chronic duodenal ulcer. Required transfusion. Coumadin stopped. Plan was to have H/H checked in one month and restart if stable. No further bleeding.  Doing well from a cardiac perspective. No edema. No orthopnea, PND or chest pain.  Walks with walker to avoid falling again. No dyspnea.     Current Medications (verified): 1)  Furosemide 40 Mg Tabs (Furosemide) .... Take One Tablet By Mouth Daily. 2)  Potassium Chloride Crys Cr 20 Meq Cr-Tabs (Potassium Chloride Crys Cr) .... Take One Tablet By Mouth Daily 3)  Vytorin 10-80 Mg Tabs (Ezetimibe-Simvastatin) .... Take 1 Tablet By Mouth At Bedtime 4)  Lotrel 10-40 Mg  Caps (Amlodipine Besy-Benazepril Hcl) .... 2 By Mouth Daily 5)  Lantus Solostar 100 Unit/ml  Soln (Insulin Glargine) .... Two Times A Day 6)  Diabetic Test Strips and Lancets .... Any Brand.  Check 4 Times A Day.  250.03 7)  Januvia 100 Mg Tabs (Sitagliptin Phosphate) .... Take 1 By Mouth Once Daily 8)  Protonix 40 Mg Tbec (Pantoprazole Sodium) .... Take 1 By Mouth Once Daily 9)  Glipizide Xl 10 Mg Xr24h-Tab (Glipizide) .... Take 1 By Mouth Once  Daily  Allergies: 1)  ! Avelox 2)  ! Pcn  Past History:  Past Medical History: Last updated: 12/16/2008 1. Congestive heart failure secondary to diastolic dysfunction      --echo 4/09: EF 55%  2. Coronary artery disease, status post coronary artery bypass graft      in 1996.  3. Paroxysmal atrial fibrillation  4. Diabetes.  5. Hypertension.  6. Hyperlipidemia.  7. Chronic renal insufficiency with a baseline creatinine of 1.6.  8. History of cerebrovascular accident with a left sided facial droop.  9. Depression.  10.Carotid bruits status post carotids in 2006, showed mild hard      plaque in the bulbs and proximal bifurcations, left greater than      right.  Both ICAs take steep posterior dives.  Right ICA 0-39%      stenosis; left ICA 40-59% stenosis.        --Carotids 9/10: 0-39% bilat  11.History of BPH.  12.Status post cholecystectomy.  13. Fall with C2 fracture 12/15/08  Family History: Reviewed history from 05/14/2009 and no changes required. Noncontributory  Social History: Reviewed history from 05/14/2009 and no changes required. Single  Tobacco Use - No.  Alcohol Use - no Drug Use - no  Review of Systems       As per HPI and past medical history; otherwise all systems negative.   Vital Signs:  Patient profile:   77 year old male Height:      71 inches Weight:  202 pounds BMI:     28.28 Pulse rate:   83 / minute Pulse rhythm:   irregular BP sitting:   140 / 60  (right arm) Cuff size:   regular  Vitals Entered By: Stanton Kidney, EMT-P (August 11, 2009 9:07 AM)  Physical Exam  General:  In neck brace ,unable to evaluate JVD Lungs: No tachypnea, clear without wheezing, rales, or rhonchi Cardiovascular: Irregular, PMI not displaced, heart sounds normal  and 2/6 systolic ejection murmur at the left sternal border Abdomen: BS normal. Soft without organomegaly, masses, lesions or tenderness. Extremities:tr edema. clubbing. Good distal pulses  bilateral SKin: Warm, no lesions or rashes  Neuro: no focal signs    Impression & Recommendations:  Problem # 1:  CAD, ARTERY BYPASS GRAFT (ICD-414.04) Stable. No evidence of ischemia. Resume low dose ASA 81.   Problem # 2:  ATRIAL FIBRILLATION (ICD-427.31) Chronic. Will check H/H today. If stable, will restart coumadin withe close INR monitoring. His son is a Engineer, civil (consulting) and would like to do home monitoring. We will discuss with pharmacy and try to make this happen.   Problem # 3:  CONGESTIVE HEART FAILURE (ICD-428.0) Stable. Volume status looks good.   Other Orders: TLB-BMP (Basic Metabolic Panel-BMET) (80048-METABOL) TLB-CBC Platelet - w/Differential (85025-CBCD) EKG w/ Interpretation (93000)  Patient Instructions: 1)  Labs today 2)  Your physician discussed the risks, benefits and indications for preventive aspirin therapy. It is recommended that you start (or continue) taking 81 mg of aspirin a day. 3)  Your physician wants you to follow-up in:  6 months.  You will receive a reminder letter in the mail two months in advance. If you don't receive a letter, please call our office to schedule the follow-up appointment.

## 2010-03-16 NOTE — Medication Information (Signed)
Summary: rov/ewj  Anticoagulant Therapy  Managed by: Cloyde Reams, RN, BSN Referring MD: Arvilla Meres, MD PCP: Wyline Mood, [facility] Supervising MD: Daleen Squibb MD, Maisie Fus Indication 1: Atrial Fibrillation (ICD-427.31) Lab Used: [facility] [place] Site: [address] INR RANGE 2 - 3  Dietary changes: no    Health status changes: no    Bleeding/hemorrhagic complications: no    Recent/future hospitalizations: no    Any changes in medication regimen? no    Recent/future dental: no  Any missed doses?: no       Is patient compliant with meds? yes       Allergies: 1)  ! Avelox 2)  ! Pcn  Anticoagulation Management History:      The patient is taking warfarin and comes in today for a routine follow up visit.  Positive risk factors for bleeding include an age of 75 years or older and presence of serious comorbidities.  The bleeding index is 'intermediate risk'.  Positive CHADS2 values include History of CHF, History of HTN, Age > 66 years old, and History of Diabetes.  The start date was 10/12/2006.  His last INR was 1.9.  Anticoagulation responsible provider: Daleen Squibb MD, Maisie Fus.  Cuvette Lot#: [id number].  Exp: 11/2010.    Anticoagulation Management Assessment/Plan:      The target INR is 2 - 3.  The next INR is due 09/18/2009.  Anticoagulation instructions were given to patient.  Results were reviewed/authorized by Cloyde Reams, RN, BSN.  He was notified by Cloyde Reams RN.         Prior Anticoagulation Instructions: INR 1.5  Take 7.5mg  today, then resume same dosage 5mg  daily. Recheck in 10 days.   Current Anticoagulation Instructions: INR 2.7  Continue on same dosage 5mg  daily.  Recheck 2-3 weeks.

## 2010-03-16 NOTE — Progress Notes (Signed)
Summary: would like results of myoview   Phone Note Call from Patient Call back at Home Phone [phone]   Caller: Patient Reason for Call: Talk to Nurse, Talk to Doctor, Lab or Test Results Summary of Call: pt would like results of myoview Initial call taken by: Omer Jack,  May 27, 2009 1:09 PM  Follow-up for Phone Call        spoke with pt. stress test results give. Pt. verbalized understanding Follow-up by: Ollen Gross, RN, BSN,  May 27, 2009 1:23 PM

## 2010-03-16 NOTE — Progress Notes (Signed)
Summary: Nuclear Pre-Procedure  Phone Note Outgoing Call Call back at [facility] Phone [phone]   Call placed by: Stanton Kidney, EMT-P,  May 20, 2009 2:05 PM Call placed to: Patient Action Taken: Phone Call Completed Summary of Call: Reviewed information on Myoview Information Sheet (see scanned document for further details).  Spoke with Patient.    Nuclear Med Background Indications for Stress Test: Evaluation for Ischemia, Surgical Clearance, Graft Patency  Indications Comments: Pending Neck Surgery; Dr. Marikay Alar  History: CABG, Echo, Heart Catheterization  History Comments: '99 CABG 2/09 Echo: EF=55% Heart Cath: Pulm. artery sensory device trial  Symptoms: DOE    Nuclear Pre-Procedure Cardiac Risk Factors: Carotid Disease, CVA, Hypertension, IDDM Type 2, Lipids Height (in): 69  Nuclear Med Study Referring MD:  Arvilla Meres

## 2010-03-16 NOTE — Medication Information (Signed)
Summary: Coumadin Clinic  Anticoagulant Therapy  Managed by: Bethena Midget, RN, BSN Referring MD: Arvilla Meres, MD PCP: Wyline Mood, [facility] Supervising MD: Tenny Craw MD, Gunnar Fusi Indication 1: Atrial Fibrillation (ICD-427.31) Lab Used: [facility] [place] Site: [address] INR POC 1.9 INR RANGE 2 - 3  Dietary changes: no    Health status changes: no    Bleeding/hemorrhagic complications: no    Recent/future hospitalizations: no    Any changes in medication regimen? no    Recent/future dental: no  Any missed doses?: no       Is patient compliant with meds? yes      Comments: Pt and son states that he is taking 5mg s daily  Allergies: 1)  ! Avelox 2)  ! Pcn  Anticoagulation Management History:      The patient is taking warfarin and comes in today for a routine follow up visit.  Positive risk factors for bleeding include an age of 75 years or older and presence of serious comorbidities.  The bleeding index is 'intermediate risk'.  Positive CHADS2 values include History of CHF, History of HTN, Age > 60 years old, and History of Diabetes.  The start date was 10/12/2006.  His last INR was 1.9.  Anticoagulation responsible provider: Tenny Craw MD, Gunnar Fusi.  INR POC: 1.9.    Anticoagulation Management Assessment/Plan:      The target INR is 2 - 3.  The next INR is due 10/30/2009.  Anticoagulation instructions were given to patient.  Results were reviewed/authorized by Bethena Midget, RN, BSN.  He was notified by Bethena Midget, RN, BSN.         Prior Anticoagulation Instructions: INR 2.4  Spoke with [place] with [employer].  Continue same dose of 1 tablet every day except 1/2 tablet on Thursday.  Recheck INR in 4 weeks.  Pt being [facility] from [facility].  HH RN to call about home monitor today.  Pt's family will call to make appt if not received home monitor.   Current Anticoagulation Instructions: INR 1.9 Today take 7.5mg s then resume 5mg s daily.  Recheck in one week. Son made aware. Pt now self-tester as of today.

## 2010-03-16 NOTE — Medication Information (Signed)
Summary: Coumadin Clinic   Anticoagulant Therapy  Managed by: Cloyde Reams, RN, BSN Referring MD: Arvilla Meres, MD PCP: Wyline Mood, [facility] Supervising MD: Juanda Chance MD, Bruce Indication 1: Atrial Fibrillation (ICD-427.31) Lab Used: self-tester [place] Site: [address] INR POC 2.1 INR RANGE 2 - 3  Dietary changes: no    Health status changes: no    Bleeding/hemorrhagic complications: no    Recent/future hospitalizations: no    Any changes in medication regimen? no    Recent/future dental: no  Any missed doses?: no       Is patient compliant with meds? yes      Comments: Self-test INR on 9/30. Results received on 10/3.   Allergies: 1)  ! Avelox 2)  ! Pcn  Anticoagulation Management History:      His anticoagulation is being managed by telephone today.  Positive risk factors for bleeding include an age of 75 years or older and presence of serious comorbidities.  The bleeding index is 'intermediate risk'.  Positive CHADS2 values include History of CHF, History of HTN, Age > 75 years old, and History of Diabetes.  The start date was 10/12/2006.  His last INR was 1.9.  Anticoagulation responsible provider: Juanda Chance MD, Smitty Cords.  INR POC: 2.1.  Exp: 11/2010.    Anticoagulation Management Assessment/Plan:      The target INR is 2 - 3.  The next INR is due 11/20/2009.  Anticoagulation instructions were given to patient.  Results were reviewed/authorized by Cloyde Reams, RN, BSN.  He was notified by Cloyde Reams RN.         Prior Anticoagulation Instructions: INR 1.7 Today and tomorrow take  7.5mg s then resume 5mg s daily. Recheck in one week. Pt is self-tester.    Current Anticoagulation Instructions: INR 2.1  Pt is a self-tester. LMOM to call back for results. Cloyde Reams RN  November 16, 2009 3:35 PM  Called spoke with pt, advised to start taking 5mg  daily except 7.5mg  on Mondays.  Recheck in 1 week. Cloyde Reams RN  November 16, 2009 3:43 PM

## 2010-03-16 NOTE — Medication Information (Signed)
Summary: Coumadin Clinic   Anticoagulant Therapy  Managed by: Weston Brass, PharmD Referring MD: Arvilla Meres, MD PCP: Wyline Mood, [facility] Supervising MD: Excell Seltzer MD, Casimiro Needle Indication 1: Atrial Fibrillation (ICD-427.31) Lab Used: self-tester [place] Site: [address] INR POC 2.6 INR RANGE 2 - 3  Dietary changes: no    Health status changes: no    Bleeding/hemorrhagic complications: no    Recent/future hospitalizations: no    Any changes in medication regimen? no    Recent/future dental: no  Any missed doses?: no       Is patient compliant with meds? yes       Allergies: 1)  ! Avelox 2)  ! Pcn  Anticoagulation Management History:      His anticoagulation is being managed by telephone today.  Positive risk factors for bleeding include an age of 27 years or older and presence of serious comorbidities.  The bleeding index is 'intermediate risk'.  Positive CHADS2 values include History of CHF, History of HTN, Age > 59 years old, and History of Diabetes.  The start date was 10/12/2006.  His last INR was 1.9.  Anticoagulation responsible provider: Excell Seltzer MD, Casimiro Needle.  INR POC: 2.6.  Exp: 11/2010.    Anticoagulation Management Assessment/Plan:      The target INR is 2 - 3.  The next INR is due 01/22/2010.  Anticoagulation instructions were given to patient.  Results were reviewed/authorized by Weston Brass, PharmD.  He was notified by Weston Brass PharmD.         Prior Anticoagulation Instructions: INR 2.9  Spoke with pt.  Continue same dose of 1 tablet every day.  Recheck INR in 1 week.   Current Anticoagulation Instructions: INR 2.6  Spoke with pt.  Continue same dose of 1 tablet every day.  Recheck INR in 1 week.

## 2010-03-16 NOTE — Medication Information (Signed)
Summary: Coumadin Clinic  Anticoagulant Therapy  Managed by: Weston Brass, PharmD Referring MD: Arvilla Meres, MD PCP: Wyline Mood, [facility] Supervising MD: Jens Som MD, Arlys John Indication 1: Atrial Fibrillation (ICD-427.31) Lab Used: [facility] [place] Site: [address] PT 28.2 INR POC 2.8 INR RANGE 2 - 3  Dietary changes: no    Health status changes: no    Bleeding/hemorrhagic complications: no    Recent/future hospitalizations: no    Any changes in medication regimen? yes       Details: added exelon patch   Recent/future dental: no  Any missed doses?: no       Is patient compliant with meds? yes       Allergies: 1)  ! Avelox 2)  ! Pcn  Anticoagulation Management History:      The patient is taking warfarin and comes in today for a routine follow up visit.  Positive risk factors for bleeding include an age of 75 years or older and presence of serious comorbidities.  The bleeding index is 'intermediate risk'.  Positive CHADS2 values include History of CHF, History of HTN, Age > 75 years old, and History of Diabetes.  The start date was 10/12/2006.  His last INR was 1.9.  Prothrombin time is 28.2.  Anticoagulation responsible provider: Jens Som MD, Arlys John.  INR POC: 2.8.  Exp: 11/2010.    Anticoagulation Management Assessment/Plan:      The target INR is 2 - 3.  The next INR is due 09/30/2009.  Anticoagulation instructions were given to patient.  Results were reviewed/authorized by Weston Brass, PharmD.  He was notified by Weston Brass PharmD.         Prior Anticoagulation Instructions: INR 4.2  Skip today's dose of Coumadin then decrease dose to 5mg  daily except 2.5mg  on Thursday.  Orders given to Sarah with [facility] while in pt's home.   Current Anticoagulation Instructions: INR 2.8  Spoke with RN while in home.  Continue same dose of 1 tablet every day except 1/2 tablet on Thursday.  Recheck INR in 1 week.
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
## 2010-03-16 NOTE — Progress Notes (Signed)
Summary: pt just released wants appt asap   Phone Note Call from Patient Call back at [phone]   Caller: Son / Marcial Pacas Reason for Call: Talk to Nurse, Talk to Doctor Summary of Call: pt was just released from ICU in another hospital and son is concerned because of medication changes such as they took patient off coumadin and they want to talk to MD about this Initial call taken by: Omer Jack,  Jul 08, 2009 12:41 PM  Follow-up for Phone Call        hgb 5 INR of 12 admitted to [facility] last week, has ulcer in the base of his stomach, stopped coumadin, coreg, and decrease lasix to 40mg  daily.  Son would like Dr Bensimhon's input will discuss w/Dr Bensimhon and call [name] back, his cell is [phone] Meredith Staggers, RN  Jul 08, 2009 1:05 PM   Dr [name] spoke w/[name] Meredith Staggers, RN  Jul 08, 2009 3:05 PM

## 2010-03-16 NOTE — Medication Information (Signed)
Summary: Coumadin Clinic   Anticoagulant Therapy  Managed by: Bethena Midget, RN, BSN Referring MD: Arvilla Meres, MD PCP: Wyline Mood, [facility] Supervising MD: Jens Som MD, Arlys John Indication 1: Atrial Fibrillation (ICD-427.31) Lab Used: self-tester [place] Site: [address] INR POC 2.2 INR RANGE 2 - 3  Dietary changes: no    Health status changes: no    Bleeding/hemorrhagic complications: no    Recent/future hospitalizations: no    Any changes in medication regimen? no    Recent/future dental: no  Any missed doses?: no       Is patient compliant with meds? yes       Allergies: 1)  ! Avelox 2)  ! Pcn  Anticoagulation Management History:      His anticoagulation is being managed by telephone today.  Positive risk factors for bleeding include an age of 8 years or older and presence of serious comorbidities.  The bleeding index is 'intermediate risk'.  Positive CHADS2 values include History of CHF, History of HTN, Age > 66 years old, and History of Diabetes.  The start date was 10/12/2006.  His last INR was 1.9.  Anticoagulation responsible provider: Jens Som MD, Arlys John.  INR POC: 2.2.    Anticoagulation Management Assessment/Plan:      The target INR is 2 - 3.  The next INR is due 11/20/2009.  Anticoagulation instructions were given to son.  Results were reviewed/authorized by Bethena Midget, RN, BSN.  He was notified by Bethena Midget, RN, BSN.         Prior Anticoagulation Instructions: INR 2.1  Pt is a self-tester. LMOM to call back for results. Cloyde Reams RN  November 16, 2009 3:35 PM  Called spoke with pt, advised to start taking 5mg  daily except 7.5mg  on Mondays.  Recheck in 1 week. Cloyde Reams RN  November 16, 2009 3:43 PM   Current Anticoagulation Instructions: INR 2.2  Attempted to reach pt.  No answer. Weston Brass PharmD  November 23, 2009 10:31 AM  Attempted to call pt with results.  No answer. Cloyde Reams RN  November 23, 2009 4:08 PM   Attempted to reach pt, no answer or VM. Bethena Midget, RN, BSN  November 24, 2009 9:04 AM  Spoke with pt son and he states that pt answering machine is malfunctioning but he's doing fine. There has been no changes. He is aware to recheck in on Friday(self-tester)Tiffany Muse, RN, BSN  November 24, 2009 9:26 AM

## 2010-03-16 NOTE — Progress Notes (Signed)
Summary: pt son rtn the call    Phone Note Call from Patient Call back at [phone]   Caller: Son/[name] Laningham Reason for Call: Talk to Nurse, Talk to Doctor, Lab or Test Results Summary of Call: rtn call from [facility] yesterday for results Initial call taken by: Omer Jack,  August 14, 2009 9:20 AM  Follow-up for Phone Call         [name] pt's son return Heather's call. Labs results, informatiion and MD's recommendations given. Pts son states he is working on 08/21/09. He can't bring pt. to CV[facility] appointment that day. He would like to change the appointment to another day and delay the starting day of coumodine. 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 and he knows about medication. Pt's son was transfered to Scheduler to change the  appointment date. Follow-up by: Ollen Gross, RN, BSN,  August 14, 2009 9:59 AM  Additional Follow-up for Phone Call Additional follow up Details #1::        thanks. Dolores Patty, MD, [facility]  August 14, 2009 5:21 PM

## 2010-03-16 NOTE — Assessment & Plan Note (Signed)
Summary: Cardiology Nuclear Study  Nuclear Med Background Indications for Stress Test: Evaluation for Ischemia, Surgical Clearance, Graft Patency  Indications Comments: Pending Neck Surgery by Dr. Marikay Alar  History: CABG, Echo, Heart Catheterization  History Comments: '99 CABG x 6; 2/09 Echo: EF=55%; h/o chronic afib, CHF  Symptoms: DOE    Nuclear Pre-Procedure Cardiac Risk Factors: Carotid Disease, CVA, History of Smoking, Hypertension, IDDM Type 2, Lipids Caffeine/Decaff Intake: none NPO After: 8:00 PM Lungs: Clear.  O2 Sat 96% on RA. IV 0.9% NS with Angio Cath: 22g     IV Site: (R) Forearm IV Started by: Stanton Kidney EMT-P Chest Size (in) 42     Height (in): 71 Weight (lb): 198 BMI: 27.72 Tech Comments: CBG= 177 @ 7:30 am this day, per Patient.  Nuclear Med Study 1 or 2 day study:  1 day     Stress Test Type:  Eugenie Birks Reading MD:  Marca Ancona, MD     Referring MD:  Arvilla Meres, MD Resting Radionuclide:  Technetium 23m Tetrofosmin     Resting Radionuclide Dose:  11 mCi  Stress Radionuclide:  Technetium 15m Tetrofosmin     Stress Radionuclide Dose:  33 mCi   Stress Protocol   Lexiscan: 0.4 mg        Nuclear Technologist:  Domenic Polite CNMT  Rest Procedure  Myocardial perfusion imaging was performed at rest 45 minutes following the intravenous administration of Myoview Technetium 39m Tetrofosmin.  Stress Procedure  The patient received IV Lexiscan 0.4 mg over 15-seconds.  Myoview injected at 30-seconds.  There were no significant changes with lexiscan.  Quantitative spect images were obtained after a 45 minute delay.  QPS Raw Data Images:  Normal; no motion artifact; normal heart/lung ratio. Stress Images:  NI: Uniform and normal uptake of tracer in all myocardial segments. Rest Images:  Uniform and normal uptake of tracer in all myocardial segments. Subtraction (SDS):  There is no evidence of scar or ischemia. Transient Ischemic Dilatation:  1.0   (Normal <1.22)  Lung/Heart Ratio:  .41  (Normal <0.45)  Quantitative Gated Spect Images QGS EDV:  112 ml QGS ESV:  44 ml QGS EF:  61 % QGS cine images:  Normal wall motion.    Overall Impression  Exercise Capacity: Lexiscan study BP Response: Normal blood pressure response. Clinical Symptoms: No symptoms.  ECG Impression: No significant ST segment change suggestive of ischemia. Overall Impression: Normal stress nuclear study.  Appended Document: Cardiology Nuclear Study looks good. ok for surgery, if needed.  Appended Document: Cardiology Nuclear Study pt aware see phone note 4/13

## 2010-03-16 NOTE — Medication Information (Signed)
Summary: Coumadin Clinic  Anticoagulant Therapy  Managed by: Bethena Midget, RN, BSN Referring MD: Arvilla Meres, MD PCP: Wyline Mood, [facility] Supervising MD: Tenny Craw MD, Gunnar Fusi Indication 1: Atrial Fibrillation (ICD-427.31) Lab Used: self-tester [place] Site: [address] INR POC 1.7 INR RANGE 2 - 3  Dietary changes: no    Health status changes: no    Bleeding/hemorrhagic complications: no    Recent/future hospitalizations: no    Any changes in medication regimen? no    Recent/future dental: no  Any missed doses?: yes     Details: Missed a dose Wednesday.  Is patient compliant with meds? yes       Allergies: 1)  ! Avelox 2)  ! Pcn  Anticoagulation Management History:      His anticoagulation is being managed by telephone today.  Positive risk factors for bleeding include an age of 77 years or older and presence of serious comorbidities.  The bleeding index is 'intermediate risk'.  Positive CHADS2 values include History of CHF, History of HTN, Age > 61 years old, and History of Diabetes.  The start date was 10/12/2006.  His last INR was 1.9.  Anticoagulation responsible provider: Tenny Craw MD, Gunnar Fusi.  INR POC: 1.7.    Anticoagulation Management Assessment/Plan:      The target INR is 2 - 3.  The next INR is due 11/13/2009.  Anticoagulation instructions were given to patient.  Results were reviewed/authorized by Bethena Midget, RN, BSN.  He was notified by Bethena Midget, RN, BSN.         Prior Anticoagulation Instructions: INR 2.0  Pt is a self-tester.  Called spoke with pt, advised to continue on same dosage 5mg  daily.  Recheck on Friday.   Current Anticoagulation Instructions: INR 1.7 Today and tomorrow take  7.5mg s then resume 5mg s daily. Recheck in one week. Pt is self-tester.

## 2010-03-16 NOTE — Medication Information (Signed)
Summary: Coumadin Clinic   Anticoagulant Therapy  Managed by: Weston Brass, PharmD Referring MD: Arvilla Meres, MD PCP: Wyline Mood, [facility] Supervising MD: Excell Seltzer MD, Casimiro Needle Indication 1: Atrial Fibrillation (ICD-427.31) Lab Used: self-tester [place] Site: [address] INR POC 2.4 INR RANGE 2 - 3  Dietary changes: no    Health status changes: no    Bleeding/hemorrhagic complications: no    Recent/future hospitalizations: no    Any changes in medication regimen? no    Recent/future dental: no  Any missed doses?: no       Is patient compliant with meds? yes       Allergies: 1)  ! Avelox 2)  ! Pcn  Anticoagulation Management History:      His anticoagulation is being managed by telephone today.  Positive risk factors for bleeding include an age of 75 years or older and presence of serious comorbidities.  The bleeding index is 'intermediate risk'.  Positive CHADS2 values include History of CHF, History of HTN, Age > 75 years old, and History of Diabetes.  The start date was 10/12/2006.  His last INR was 1.9.  Anticoagulation responsible provider: Excell Seltzer MD, Casimiro Needle.  INR POC: 2.4.  Exp: 11/2010.    Anticoagulation Management Assessment/Plan:      The target INR is 2 - 3.  The next INR is due 12/18/2009.  Anticoagulation instructions were given to son.  Results were reviewed/authorized by Weston Brass, PharmD.  He was notified by Weston Brass PharmD.         Prior Anticoagulation Instructions: INR 1.5  Pt is a self-tester.  Called spoke with pt, advised to take an extra 1/2 tablet today, then start taking 1 tablet daily except 1.5 tablets on MWF.  Recheck in 1 week  Current Anticoagulation Instructions: INR 2.4  Spoke with pt.  Continue same dose of 1 tablet every day except 1 1/2 tablets on Monday, Wednesday and Friday.  Recheck INR in 1 week.

## 2010-03-16 NOTE — Letter (Signed)
Summary: [facility] [employer]   Imported By: Marylou Mccoy 10/06/2009 10:18:05  _____________________________________________________________________  External Attachment:    Type:   Image     Comment:   External Document

## 2010-03-16 NOTE — Medication Information (Signed)
Summary: Coumadin Clinic  Anticoagulant Therapy  Managed by: Weston Brass, PharmD Referring MD: Arvilla Meres, MD PCP: Wyline Mood, [facility] Supervising MD: Juanda Chance MD, Bruce Indication 1: Atrial Fibrillation (ICD-427.31) Lab Used: [facility] [place] Site: [address] PT 24.7 INR POC 2.5 INR RANGE 2 - 3  Dietary changes: no    Health status changes: no    Bleeding/hemorrhagic complications: no    Recent/future hospitalizations: no    Any changes in medication regimen? no    Recent/future dental: no  Any missed doses?: no       Is patient compliant with meds? yes       Allergies: 1)  ! Avelox 2)  ! Pcn  Anticoagulation Management History:      His anticoagulation is being managed by telephone today.  Positive risk factors for bleeding include an age of 75 years or older and presence of serious comorbidities.  The bleeding index is 'intermediate risk'.  Positive CHADS2 values include History of CHF, History of HTN, Age > 21 years old, and History of Diabetes.  The start date was 10/12/2006.  His last INR was 1.9.  Prothrombin time is 24.7.  Anticoagulation responsible provider: Juanda Chance MD, Smitty Cords.  INR POC: 2.5.  Exp: 11/2010.    Anticoagulation Management Assessment/Plan:      The target INR is 2 - 3.  The next INR is due 10/14/2009.  Anticoagulation instructions were given to patient.  Results were reviewed/authorized by Weston Brass, PharmD.  He was notified by Weston Brass PharmD.         Prior Anticoagulation Instructions: INR 2.8  Spoke with RN while in home.  Continue same dose of 1 tablet every day except 1/2 tablet on Thursday.  Recheck INR in 1 week.   Current Anticoagulation Instructions: INR 2.5  Spoke with Maralyn Sago, RN with [facility] while in pt's home.  Continue same dose of 1 tablet every day except 1/2 tablet on Thursday.  Recheck INR in 2 weeks.

## 2010-03-16 NOTE — Medication Information (Signed)
Summary: Coumadin Clinic  Anticoagulant Therapy  Managed by: Wayne Reams, RN, BSN Referring MD: Wayne Meres, MD PCP: Wayne Montoya, [facility] Supervising MD: Wayne Chance MD, Wayne Montoya Indication 1: Atrial Fibrillation (ICD-427.31) Lab Used: self-tester Wayne Montoya Site: [address] INR POC 3.1 INR RANGE 2 - 3    Bleeding/hemorrhagic complications: no     Any changes in medication regimen? yes       Details: D/C Glipizide.   Any missed doses?: no       Is patient compliant with meds? yes       Allergies: 1)  ! Avelox 2)  ! Pcn  Anticoagulation Management History:      His anticoagulation is being managed by telephone today.  Positive risk factors for bleeding include an age of 75 years or older and presence of serious comorbidities.  The bleeding index is 'intermediate risk'.  Positive CHADS2 values include History of CHF, History of HTN, Age > 41 years old, and History of Diabetes.  The start date was 10/12/2006.  His last INR was 1.9.  Anticoagulation responsible provider: Juanda Chance MD, Wayne Montoya.  INR POC: 3.1.  Exp: 11/2010.    Anticoagulation Management Assessment/Plan:      The target INR is 2 - 3.  The next INR is due 12/25/2009.  Anticoagulation instructions were given to son.  Results were reviewed/authorized by Wayne Reams, RN, BSN.  He was notified by Wayne Reams RN.         Prior Anticoagulation Instructions: INR 2.4  Spoke with pt.  Continue same dose of 1 tablet every day except 1 1/2 tablets on Monday, Wednesday and Friday.  Recheck INR in 1 week.   Current Anticoagulation Instructions: INR 3.1  Pt is a self-tester.  Advised to take 1 tablet today, then start taking 1 tablet daily except 1.5 tablets on Mondays and Fridays.  Recheck in 1 week.

## 2010-03-16 NOTE — Medication Information (Signed)
Summary: Coumadin Clinic  Anticoagulant Therapy  Managed by: Cloyde Reams, RN, BSN Referring MD: Arvilla Meres, MD PCP: Wyline Mood, [facility] Supervising MD: Tenny Craw MD, Gunnar Fusi Indication 1: Atrial Fibrillation (ICD-427.31) Lab Used: self-tester [place] Site: [address] INR POC 1.5 INR RANGE 2 - 3  Dietary changes: no    Health status changes: no    Bleeding/hemorrhagic complications: no    Recent/future hospitalizations: no    Any changes in medication regimen? no    Recent/future dental: no  Any missed doses?: no       Is patient compliant with meds? yes       Allergies: 1)  ! Avelox 2)  ! Pcn  Anticoagulation Management History:      His anticoagulation is being managed by telephone today.  Positive risk factors for bleeding include an age of 24 years or older and presence of serious comorbidities.  The bleeding index is 'intermediate risk'.  Positive CHADS2 values include History of CHF, History of HTN, Age > 12 years old, and History of Diabetes.  The start date was 10/12/2006.  His last INR was 1.9.  Anticoagulation responsible provider: Tenny Craw MD, Gunnar Fusi.  INR POC: 1.5.  Exp: 11/2010.    Anticoagulation Management Assessment/Plan:      The target INR is 2 - 3.  The next INR is due 12/11/2009.  Anticoagulation instructions were given to son.  Results were reviewed/authorized by Cloyde Reams, RN, BSN.  He was notified by Cloyde Reams RN.         Prior Anticoagulation Instructions: INR 1.9  Pt is a self-tester. Called spoke with pt, advised to start taking 1 tablet daily except 1.5 tablets on Mondays and Fridays.  Recheck in 1 week.   Current Anticoagulation Instructions: INR 1.5  Pt is a self-tester.  Called spoke with pt, advised to take an extra 1/2 tablet today, then start taking 1 tablet daily except 1.5 tablets on MWF.  Recheck in 1 week

## 2010-03-16 NOTE — Medication Information (Signed)
Summary: rov/sp  Anticoagulant Therapy  Managed by: Cloyde Reams, RN, BSN Referring MD: Arvilla Meres, MD PCP: Wyline Mood, [facility] Supervising MD: Tenny Craw MD, Gunnar Fusi Indication 1: Atrial Fibrillation (ICD-427.31) Lab Used: [facility] [place] Site: [address] INR POC 1.5 INR RANGE 2 - 3      Any changes in medication regimen? yes       Details: Added Namenda daily.   Recent/future dental: no  Any missed doses?: no       Is patient compliant with meds? yes      Comments: Restarted on coumadin x 1 week ago.    Allergies: 1)  ! Avelox 2)  ! Pcn  Anticoagulation Management History:      The patient is taking warfarin and comes in today for a routine follow up visit.  Positive risk factors for bleeding include an age of 75 years or older and presence of serious comorbidities.  The bleeding index is 'intermediate risk'.  Positive CHADS2 values include History of CHF, History of HTN, Age > 83 years old, and History of Diabetes.  The start date was 10/12/2006.  His last INR was 1.9.  Anticoagulation responsible provider: Tenny Craw MD, Gunnar Fusi.  INR POC: 1.5.  Cuvette Lot#: [id number].  Exp: 10/2010.    Anticoagulation Management Assessment/Plan:      The target INR is 2 - 3.  The next INR is due 09/04/2009.  Anticoagulation instructions were given to home health nurse/son.  Results were reviewed/authorized by Cloyde Reams, RN, BSN.  He was notified by Cloyde Reams RN.         Prior Anticoagulation Instructions: INR 3.2  Start taking 1 tablet daily.  Recheck in 2 weeks.    Current Anticoagulation Instructions: INR 1.5  Take 7.5mg  today, then resume same dosage 5mg  daily. Recheck in 10 days.

## 2010-03-16 NOTE — Medication Information (Signed)
Summary: [facility]   Anticoagulant Therapy  Managed by: Weston Brass, PharmD Referring MD: Arvilla Meres, MD PCP: Wyline Mood, [facility] Supervising MD: Antoine Poche MD, Fayrene Fearing Indication 1: Atrial Fibrillation (ICD-427.31) Lab Used: self-tester  Site: [address] INR POC 2.9 INR RANGE 2 - 3  Dietary changes: no    Health status changes: no    Bleeding/hemorrhagic complications: no    Recent/future hospitalizations: no    Any changes in medication regimen? no    Recent/future dental: no  Any missed doses?: no       Is patient compliant with meds? yes       Allergies: 1)  ! Avelox 2)  ! Pcn  Anticoagulation Management History:      His anticoagulation is being managed by telephone today.  Positive risk factors for bleeding include an age of 75 years or older and presence of serious comorbidities.  The bleeding index is 'intermediate risk'.  Positive CHADS2 values include History of CHF, History of HTN, Age > 75 years old, and History of Diabetes.  The start date was 10/12/2006.  His last INR was 1.9.  Anticoagulation responsible provider: Antoine Poche MD, Fayrene Fearing.  INR POC: 2.9.  Exp: 11/2010.    Anticoagulation Management Assessment/Plan:      The target INR is 2 - 3.  The next INR is due 01/18/2010.  Anticoagulation instructions were given to patient.  Results were reviewed/authorized by Weston Brass, PharmD.  He was notified by Weston Brass PharmD.         Prior Anticoagulation Instructions: INR 4.1 Skip Saturday dose then change dose to 5mg s daily. Recheck in  one week.   Current Anticoagulation Instructions: INR 2.9  Spoke with pt.  Continue same dose of 1 tablet every day.  Recheck INR in 1 week.

## 2010-03-16 NOTE — Progress Notes (Signed)
Summary: Refill request  Phone Note Refill Request Message from:  Patient on August 24, 2009 11:54 AM  Refills Requested: Medication #1:  POTASSIUM CHLORIDE CRYS CR 20 MEQ CR-TABS Take one tablet by mouth daily   Supply Requested: 1 month [facility]   Method Requested: Electronic Initial call taken by: Cloyde Reams RN,  August 24, 2009 11:54 AM    Prescriptions: POTASSIUM CHLORIDE CRYS CR 20 MEQ CR-TABS (POTASSIUM CHLORIDE CRYS CR) Take one tablet by mouth daily  #30 x 6   Entered by:   Hardin Negus, RMA   Authorized by:   Dolores Patty, MD, [facility]   Signed by:   Hardin Negus, RMA on 08/25/2009   Method used:   Electronically to        [facility] (retail)       [phone] [address]       [place], [place]  [address]       Ph: [phone] or [phone]       Fax: [phone]   RxID:   [id number]

## 2010-03-16 NOTE — Medication Information (Signed)
Summary: [facility] Clinic  Anticoagulant Therapy  Managed by: Bethena Midget, RN, BSN Referring MD: Arvilla Meres, MD PCP: Wyline Mood, [facility] Supervising MD: Antoine Poche MD, Fayrene Fearing Indication 1: Atrial Fibrillation (ICD-427.31) Lab Used: self-tester [place] Site: [address] INR POC 4.1 INR RANGE 2 - 3  Dietary changes: no    Health status changes: no    Bleeding/hemorrhagic complications: no    Recent/future hospitalizations: no    Any changes in medication regimen? no    Recent/future dental: no  Any missed doses?: no       Is patient compliant with meds? yes       Allergies: 1)  ! Avelox 2)  ! Pcn  Anticoagulation Management History:      His anticoagulation is being managed by telephone today.  Positive risk factors for bleeding include an age of 75 years or older and presence of serious comorbidities.  The bleeding index is 'intermediate risk'.  Positive CHADS2 values include History of CHF, History of HTN, Age > 83 years old, and History of Diabetes.  The start date was 10/12/2006.  His last INR was 1.9.  Anticoagulation responsible provider: Antoine Poche MD, Fayrene Fearing.  INR POC: 4.1.    Anticoagulation Management Assessment/Plan:      The target INR is 2 - 3.  The next INR is due 01/08/2010.  Anticoagulation instructions were given to patient.  Results were reviewed/authorized by Bethena Midget, RN, BSN.  He was notified by Bethena Midget, RN, BSN.         Prior Anticoagulation Instructions: INR 3.3  Spoke with pt.  Skip tomorrow's dose of Coumadin then decrease dose to 1 tablet every day except 1 1/2 tablets on Monday.  Recheck INR in 1 week.   Current Anticoagulation Instructions: INR 4.1 Skip Saturday dose then change dose to 5mg s daily. Recheck in  one week.

## 2010-03-16 NOTE — Medication Information (Signed)
Summary: [facility] Clinic   Anticoagulant Therapy  Managed by: Weston Brass, PharmD Referring MD: Arvilla Meres, MD PCP: Wyline Mood, [facility] Supervising MD: Jens Som MD, Arlys John Indication 1: Atrial Fibrillation (ICD-427.31) Lab Used: self-tester [place] Site: [address] INR POC 3.3 INR RANGE 2 - 3  Dietary changes: no    Health status changes: no    Bleeding/hemorrhagic complications: no    Recent/future hospitalizations: no    Any changes in medication regimen? no    Recent/future dental: no  Any missed doses?: no       Is patient compliant with meds? yes       Allergies: 1)  ! Avelox 2)  ! Pcn  Anticoagulation Management History:      His anticoagulation is being managed by telephone today.  Positive risk factors for bleeding include an age of 75 years or older and presence of serious comorbidities.  The bleeding index is 'intermediate risk'.  Positive CHADS2 values include History of CHF, History of HTN, Age > 64 years old, and History of Diabetes.  The start date was 10/12/2006.  His last INR was 1.9.  Anticoagulation responsible provider: Jens Som MD, Arlys John.  INR POC: 3.3.  Exp: 11/2010.    Anticoagulation Management Assessment/Plan:      The target INR is 2 - 3.  The next INR is due 01/01/2010.  Anticoagulation instructions were given to son.  Results were reviewed/authorized by Weston Brass, PharmD.  77 was notified by Weston Brass PharmD.         Prior Anticoagulation Instructions: INR 3.1  Pt is a self-tester.  Advised to take 1 tablet today, then start taking 1 tablet daily except 1.5 tablets on Mondays and Fridays.  Recheck in 1 week.   Current Anticoagulation Instructions: INR 3.3  Spoke with pt.  Skip tomorrow's dose of Coumadin then decrease dose to 1 tablet every day except 1 1/2 tablets on Monday.  Recheck INR in 1 week.

## 2010-03-16 NOTE — Medication Information (Signed)
Summary: Coumadin Clinic  Anticoagulant Therapy  Managed by: Weston Brass, PharmD Referring MD: Arvilla Meres, MD PCP: Wyline Mood, [facility] Supervising MD: Juanda Chance MD, Bruce Indication 1: Atrial Fibrillation (ICD-427.31) Lab Used: [facility] [place] Site: [address] INR POC 2.4 INR RANGE 2 - 3  Dietary changes: no    Health status changes: no    Bleeding/hemorrhagic complications: no    Recent/future hospitalizations: no    Any changes in medication regimen? no    Recent/future dental: no  Any missed doses?: no       Is patient compliant with meds? yes       Allergies: 1)  ! Avelox 2)  ! Pcn  Anticoagulation Management History:      His anticoagulation is being managed by telephone today.  Positive risk factors for bleeding include an age of 75 years or older and presence of serious comorbidities.  The bleeding index is 'intermediate risk'.  Positive CHADS2 values include History of CHF, History of HTN, Age > 68 years old, and History of Diabetes.  The start date was 10/12/2006.  His last INR was 1.9.  Anticoagulation responsible provider: Juanda Chance MD, Smitty Cords.  INR POC: 2.4.  Exp: 11/2010.    Anticoagulation Management Assessment/Plan:      The target INR is 2 - 3.  The next INR is due 11/11/2009.  Anticoagulation instructions were given to patient.  Results were reviewed/authorized by Weston Brass, PharmD.  He was notified by Weston Brass PharmD.         Prior Anticoagulation Instructions: INR 2.5  Spoke with Maralyn Sago, RN with [facility] while in pt's home.  Continue same dose of 1 tablet every day except 1/2 tablet on Thursday.  Recheck INR in 2 weeks.   Current Anticoagulation Instructions: INR 2.4  Spoke with Holly with [facility].  Continue same dose of 1 tablet every day except 1/2 tablet on Thursday.  Recheck INR in 4 weeks.  Pt being discharged from [facility].  HH RN to call about home monitor today.  Pt's family will call to make  appt if not received home monitor.

## 2010-03-16 NOTE — Medication Information (Signed)
Summary: Coumadin Clinic  Anticoagulant Therapy  Managed by: Weston Brass, PharmD Referring MD: Arvilla Meres, MD PCP: Wyline Mood, [facility] Supervising MD: Riley Kill MD, Maisie Fus Indication 1: Atrial Fibrillation (ICD-427.31) Lab Used: [facility] [place] Site: [address] INR POC 4.2 INR RANGE 2 - 3  Dietary changes: no    Health status changes: no    Bleeding/hemorrhagic complications: no    Recent/future hospitalizations: no    Any changes in medication regimen? no    Recent/future dental: no  Any missed doses?: no       Is patient compliant with meds? yes       Allergies: 1)  ! Avelox 2)  ! Pcn  Anticoagulation Management History:      His anticoagulation is being managed by telephone today.  Positive risk factors for bleeding include an age of 75 years or older and presence of serious comorbidities.  The bleeding index is 'intermediate risk'.  Positive CHADS2 values include History of CHF, History of HTN, Age > 10 years old, and History of Diabetes.  The start date was 10/12/2006.  His last INR was 1.9.  Anticoagulation responsible provider: Riley Kill MD, Maisie Fus.  INR POC: 4.2.  Exp: 11/2010.    Anticoagulation Management Assessment/Plan:      The target INR is 2 - 3.  The next INR is due 09/23/2009.  Anticoagulation instructions were given to patient.  Results were reviewed/authorized by Weston Brass, PharmD.  He was notified by Weston Brass PharmD.         Prior Anticoagulation Instructions: INR 2.7  Continue on same dosage 5mg  daily.  Recheck 2-3 weeks.    Current Anticoagulation Instructions: INR 4.2  Skip today's dose of Coumadin then decrease dose to 5mg  daily except 2.5mg  on Thursday.  Orders given to Sarah with [facility] while in pt's home.

## 2010-03-18 NOTE — Medication Information (Signed)
Summary: Coumadin Clinic  Anticoagulant Therapy  Managed by: Bethena Midget, RN, BSN Referring MD: Arvilla Meres, MD PCP: Wyline Mood, [facility] Supervising MD: Daleen Squibb MD, Maisie Fus Indication 1: Atrial Fibrillation (ICD-427.31) Lab Used: self-tester [place] Site: [address] INR POC 2.7 INR RANGE 2 - 3  Dietary changes: no    Health status changes: no    Bleeding/hemorrhagic complications: no    Recent/future hospitalizations: no    Any changes in medication regimen? no    Recent/future dental: no  Any missed doses?: no       Is patient compliant with meds? yes      Comments: Self-tester lab from Friday.   Allergies: 1)  ! Avelox 2)  ! Pcn  Anticoagulation Management History:      His anticoagulation is being managed by telephone today.  Positive risk factors for bleeding include an age of 5 years or older and presence of serious comorbidities.  The bleeding index is 'intermediate risk'.  Positive CHADS2 values include History of CHF, History of HTN, Age > 22 years old, and History of Diabetes.  The start date was 10/12/2006.  His last INR was 1.9.  Anticoagulation responsible Edis Huish: Daleen Squibb MD, Maisie Fus.  INR POC: 2.7.    Anticoagulation Management Assessment/Plan:      The target INR is 2 - 3.  The next INR is due 02/05/2010.  Anticoagulation instructions were given to patient.  Results were reviewed/authorized by Bethena Midget, RN, BSN.  He was notified by Bethena Midget, RN, BSN.         Prior Anticoagulation Instructions: INR 1.8  Spoke with pt.  Take 1 1/2 tablets today then resume same dose of 1 tablet every day.  Recheck INR in 1 week.   Current Anticoagulation Instructions: INR 2.7 Continue 5mg s daily. Recheck in one week. Self-tester

## 2010-03-18 NOTE — Medication Information (Signed)
Summary: Coumadin Clinic  Anticoagulant Therapy  Managed by: Cloyde Reams, RN, BSN Referring MD: Arvilla Meres, MD PCP: Wyline Mood, [facility] Supervising MD: Jens Som MD, Arlys John Indication 1: Atrial Fibrillation (ICD-427.31) Lab Used: self-tester [place] Site: [address] INR POC 2.6 INR RANGE 2 - 3  Dietary changes: no    Health status changes: no    Bleeding/hemorrhagic complications: no    Recent/future hospitalizations: no    Any changes in medication regimen? no    Recent/future dental: no  Any missed doses?: no       Is patient compliant with meds? yes      Comments: Self-tester  Allergies: 1)  ! Avelox 2)  ! Pcn  Anticoagulation Management History:      His anticoagulation is being managed by telephone today.  Positive risk factors for bleeding include an age of 75 years or older and presence of serious comorbidities.  The bleeding index is 'intermediate risk'.  Positive CHADS2 values include History of CHF, History of HTN, Age > 65 years old, and History of Diabetes.  The start date was 10/12/2006.  His last INR was 1.9.  Anticoagulation responsible provider: Jens Som MD, Arlys John.  INR POC: 2.6.    Anticoagulation Management Assessment/Plan:      The target INR is 2 - 3.  The next INR is due 02/18/2010.  Anticoagulation instructions were given to patient.  Results were reviewed/authorized by Cloyde Reams, RN, BSN.  He was notified by Cloyde Reams RN.         Prior Anticoagulation Instructions: INR 3.4  Spoke with pt's son.  Skip Saturday's dose of Coumadin then resume same dose of 1 tablet every day.  Recheck INR in 1 week.   Current Anticoagulation Instructions: INR 2.6 LMOM to call for dosing. Bethena Midget, RN, BSN  February 11, 2010 3:50 PM Called spoke with pt, advised to continue on same dosage 5mg  daily  and recheck in 1 week. Cloyde Reams RN  February 11, 2010 4:42 PM

## 2010-03-18 NOTE — Medication Information (Signed)
Summary: Coumadin Clinic   Anticoagulant Therapy  Managed by: Weston Brass, PharmD Referring MD: Arvilla Meres, MD PCP: Wyline Mood, [facility] Supervising MD: Riley Kill MD, Maisie Fus Indication 1: Atrial Fibrillation (ICD-427.31) Lab Used: self-tester  Site: [address] INR POC 3.4 INR RANGE 2 - 3  Dietary changes: no    Health status changes: no    Bleeding/hemorrhagic complications: no    Recent/future hospitalizations: no    Any changes in medication regimen? no    Recent/future dental: no  Any missed doses?: no       Is patient compliant with meds? yes       Allergies: 1)  ! Avelox 2)  ! Pcn  Anticoagulation Management History:      His anticoagulation is being managed by telephone today.  Positive risk factors for bleeding include an age of 76 years or older and presence of serious comorbidities.  The bleeding index is 'intermediate risk'.  Positive CHADS2 values include History of CHF, History of HTN, Age > 41 years old, and History of Diabetes.  The start date was 10/12/2006.  His last INR was 1.9.  Anticoagulation responsible provider: Riley Kill MD, Maisie Fus.  INR POC: 3.4.  Exp: 11/2010.    Anticoagulation Management Assessment/Plan:      The target INR is 2 - 3.  The next INR is due 02/12/2010.  Anticoagulation instructions were given to patient.  Results were reviewed/authorized by Weston Brass, PharmD.  He was notified by Weston Brass PharmD.         Prior Anticoagulation Instructions: INR 2.7 Continue 5mg s daily. Recheck in one week. Self-tester  Current Anticoagulation Instructions: INR 3.4  Spoke with pt's son.  Skip Saturday's dose of Coumadin then resume same dose of 1 tablet every day.  Recheck INR in 1 week.

## 2010-03-18 NOTE — Medication Information (Signed)
Summary: Coumadin Clinic  Anticoagulant Therapy  Managed by: Cloyde Reams, RN, BSN Referring MD: Arvilla Meres, MD PCP: Wyline Mood, [facility] Supervising MD: Tenny Craw MD, Gunnar Fusi Indication 1: Atrial Fibrillation (ICD-427.31) Lab Used: self-tester [place] Site: [address] INR POC 3.4 INR RANGE 2 - 3    Bleeding/hemorrhagic complications: no     Any changes in medication regimen? no     Any missed doses?: no       Is patient compliant with meds? yes       Allergies: 1)  ! Avelox 2)  ! Pcn  Anticoagulation Management History:      His anticoagulation is being managed by telephone today.  Positive risk factors for bleeding include an age of 33 years or older and presence of serious comorbidities.  The bleeding index is 'intermediate risk'.  Positive CHADS2 values include History of CHF, History of HTN, Age > 23 years old, and History of Diabetes.  The start date was 10/12/2006.  His last INR was 1.9.  Anticoagulation responsible provider: Tenny Craw MD, Gunnar Fusi.  INR POC: 3.4.    Anticoagulation Management Assessment/Plan:      The target INR is 2 - 3.  The next INR is due 02/26/2010.  Anticoagulation instructions were given to patient.  Results were reviewed/authorized by Cloyde Reams, RN, BSN.  He was notified by Cloyde Reams RN.         Prior Anticoagulation Instructions: INR 2.6 LMOM to call for dosing. Bethena Midget, RN, BSN  February 11, 2010 3:50 PM Called spoke with pt, advised to continue on same dosage 5mg  daily  and recheck in 1 week. Cloyde Reams RN  February 11, 2010 4:42 PM     Current Anticoagulation Instructions: INR 3.4 LMOM to hold coumadin today. Return call for dosing. Bethena Midget, RN, BSN  February 19, 2010 3:31 PM Spoke with pt advised to hold Coumadin x 1 dosage tomorrow, then resume same dosage 5mg  daily.  Recheck in 1 week.

## 2010-03-18 NOTE — Medication Information (Signed)
Summary: Coumadin Clinic   Anticoagulant Therapy  Managed by: Weston Brass, PharmD Referring MD: Arvilla Meres, MD PCP: Wyline Mood, [facility] Supervising MD: Johney Frame MD, Fayrene Fearing Indication 1: Atrial Fibrillation (ICD-427.31) Lab Used: self-tester [place] Site: [address] INR POC 1.8 INR RANGE 2 - 3  Dietary changes: no    Health status changes: no    Bleeding/hemorrhagic complications: no    Recent/future hospitalizations: no    Any changes in medication regimen? no    Recent/future dental: no  Any missed doses?: no       Is patient compliant with meds? yes       Allergies: 1)  ! Avelox 2)  ! Pcn  Anticoagulation Management History:      His anticoagulation is being managed by telephone today.  Positive risk factors for bleeding include an age of 30 years or older and presence of serious comorbidities.  The bleeding index is 'intermediate risk'.  Positive CHADS2 values include History of CHF, History of HTN, Age > 73 years old, and History of Diabetes.  The start date was 10/12/2006.  His last INR was 1.9.  Anticoagulation responsible provider: Avanell Banwart MD, Fayrene Fearing.  INR POC: 1.8.  Exp: 11/2010.    Anticoagulation Management Assessment/Plan:      The target INR is 2 - 3.  The next INR is due 01/29/2010.  Anticoagulation instructions were given to patient.  Results were reviewed/authorized by Weston Brass, PharmD.  He was notified by Weston Brass PharmD.         Prior Anticoagulation Instructions: INR 2.6  Spoke with pt.  Continue same dose of 1 tablet every day.  Recheck INR in 1 week.   Current Anticoagulation Instructions: INR 1.8  Spoke with pt.  Take 1 1/2 tablets today then resume same dose of 1 tablet every day.  Recheck INR in 1 week.

## 2010-03-18 NOTE — Medication Information (Signed)
Summary: Coumadin Clinic  Anticoagulant Therapy  Managed by: Bethena Midget, RN, BSN Referring MD: Arvilla Meres, MD PCP: Wyline Mood, [facility] Supervising MD: Antoine Poche MD, Fayrene Fearing Indication 1: Atrial Fibrillation (ICD-427.31) Lab Used: self-tester [place] Site: [address] INR POC 2.2 INR RANGE 2 - 3  Dietary changes: no    Health status changes: no    Bleeding/hemorrhagic complications: no    Recent/future hospitalizations: no    Any changes in medication regimen? no    Recent/future dental: no  Any missed doses?: no       Is patient compliant with meds? yes       Allergies: 1)  ! Avelox 2)  ! Pcn  Anticoagulation Management History:      His anticoagulation is being managed by telephone today.  Positive risk factors for bleeding include an age of 75 years or older and presence of serious comorbidities.  The bleeding index is 'intermediate risk'.  Positive CHADS2 values include History of CHF, History of HTN, Age > 75 years old, and History of Diabetes.  The start date was 10/12/2006.  His last INR was 1.9.  Anticoagulation responsible provider: Antoine Poche MD, Fayrene Fearing.  INR POC: 2.2.    Anticoagulation Management Assessment/Plan:      The target INR is 2 - 3.  The next INR is due 03/05/2010.  Anticoagulation instructions were given to patient.  Results were reviewed/authorized by Bethena Midget, RN, BSN.  He was notified by Bethena Midget, RN, BSN.         Prior Anticoagulation Instructions: INR 3.4 LMOM to hold coumadin today. Return call for dosing. Bethena Midget, RN, BSN  February 19, 2010 3:31 PM Spoke with pt advised to hold Coumadin x 1 dosage tomorrow, then resume same dosage 5mg  daily.  Recheck in 1 week.   Current Anticoagulation Instructions: INR 2.2 Continue 5mg s daily. Recheck in one week.

## 2010-03-18 NOTE — Medication Information (Signed)
Summary: [facility]   Anticoagulant Therapy  Managed by: Weston Brass, PharmD Referring MD: Arvilla Meres, MD PCP: Wyline Mood, [facility] Supervising MD: Tenny Craw MD, Gunnar Fusi Indication 1: Atrial Fibrillation (ICD-427.31) Lab Used: self-tester [place] Site: [address] INR POC 1.5 INR RANGE 2 - 3  Dietary changes: no    Health status changes: no    Bleeding/hemorrhagic complications: no    Recent/future hospitalizations: no    Any changes in medication regimen? no    Recent/future dental: no  Any missed doses?: no       Is patient compliant with meds? yes       Allergies: 1)  ! Avelox 2)  ! Pcn  Anticoagulation Management History:      His anticoagulation is being managed by telephone today.  Positive risk factors for bleeding include an age of 75 years or older and presence of serious comorbidities.  The bleeding index is 'intermediate risk'.  Positive CHADS2 values include History of CHF, History of HTN, Age > 75 years old, and History of Diabetes.  The start date was 10/12/2006.  His last INR was 1.9.  Anticoagulation responsible provider: Tenny Craw MD, Gunnar Fusi.  INR POC: 1.5.  Exp: 11/2010.    Anticoagulation Management Assessment/Plan:      The target INR is 2 - 3.  The next INR is due 03/19/2010.  Anticoagulation instructions were given to patient.  Results were reviewed/authorized by Weston Brass, PharmD.  He was notified by Weston Brass PharmD.         Prior Anticoagulation Instructions: INR 2.1 Continue 5mg s daily. Recheck in one week.   Current Anticoagulation Instructions: INR 1.5  Spoke with pt.  Take 1 1/2 tablets today and tomorrow then resume same dose of 1 tablet every day.  Recheck INR in 1 week.

## 2010-03-18 NOTE — Medication Information (Addendum)
Summary: [facility]  Anticoagulant Therapy  Managed by: Bethena Midget, RN, BSN Referring MD: Arvilla Meres, MD PCP: Wyline Mood, [facility] Supervising MD: Shirlee Latch MD, Yisroel Mullendore Indication 1: Atrial Fibrillation (ICD-427.31) Lab Used: self-tester  Site: [address] INR POC 2.1 INR RANGE 2 - 3  Dietary changes: no    Health status changes: no    Bleeding/hemorrhagic complications: no    Recent/future hospitalizations: no    Any changes in medication regimen? no    Recent/future dental: no  Any missed doses?: no       Is patient compliant with meds? yes      Comments: Per Pt   Allergies: 1)  ! Avelox 2)  ! Pcn  Anticoagulation Management History:      His anticoagulation is being managed by telephone today.  Positive risk factors for bleeding include an age of 75 years or older and presence of serious comorbidities.  The bleeding index is 'intermediate risk'.  Positive CHADS2 values include History of CHF, History of HTN, Age > 62 years old, and History of Diabetes.  The start date was 10/12/2006.  His last INR was 1.9.  Anticoagulation responsible provider: Shirlee Latch MD, Jaeli Grubb.  INR POC: 2.1.    Anticoagulation Management Assessment/Plan:      The target INR is 2 - 3.  The next INR is due 03/12/2010.  Anticoagulation instructions were given to patient.  Results were reviewed/authorized by Bethena Midget, RN, BSN.  He was notified by Bethena Midget, RN, BSN.         Prior Anticoagulation Instructions: INR 2.2 Continue 5mg s daily. Recheck in one week.   Current Anticoagulation Instructions: INR 2.1 Continue 5mg s daily. Recheck in one week.

## 2010-03-19 ENCOUNTER — Encounter: Payer: Self-pay | Admitting: Cardiology

## 2010-03-19 ENCOUNTER — Encounter: Payer: Self-pay | Admitting: Internal Medicine

## 2010-03-24 NOTE — Medication Information (Signed)
Summary: [facility]  Anticoagulant Therapy  Managed by: Bethena Midget, RN, BSN Referring MD: Arvilla Meres, MD PCP: Wyline Mood, [facility] Supervising MD: Jens Som MD, Arlys John Indication 1: Atrial Fibrillation (ICD-427.31) Lab Used: self-tester [place] Site: [address] INR POC 1.9 INR RANGE 2 - 3  Dietary changes: no    Health status changes: no    Bleeding/hemorrhagic complications: no    Recent/future hospitalizations: no    Any changes in medication regimen? no    Recent/future dental: no  Any missed doses?: no       Is patient compliant with meds? yes       Allergies: 1)  ! Avelox 2)  ! Pcn  Anticoagulation Management History:      His anticoagulation is being managed by telephone today.  Positive risk factors for bleeding include an age of 75 years or older and presence of serious comorbidities.  The bleeding index is 'intermediate risk'.  Positive CHADS2 values include History of CHF, History of HTN, Age > 31 years old, and History of Diabetes.  The start date was 10/12/2006.  His last INR was 1.9.  Anticoagulation responsible provider: Jens Som MD, Arlys John.  INR POC: 1.9.    Anticoagulation Management Assessment/Plan:      The target INR is 2 - 3.  The next INR is due 03/26/2010.  Anticoagulation instructions were given to patient.  Results were reviewed/authorized by Bethena Midget, RN, BSN.  He was notified by Bethena Midget, RN, BSN.         Prior Anticoagulation Instructions: INR 1.5  Spoke with pt.  Take 1 1/2 tablets today and tomorrow then resume same dose of 1 tablet every day.  Recheck INR in 1 week.   Current Anticoagulation Instructions: INR 1.9 Today take 7.5mg s then resume 5mg s daily. Recheck in one week.

## 2010-03-26 ENCOUNTER — Encounter: Payer: Self-pay | Admitting: Internal Medicine

## 2010-03-26 ENCOUNTER — Encounter: Payer: Self-pay | Admitting: Cardiology

## 2010-03-26 LAB — CONVERTED CEMR LAB: POC INR: 2.9

## 2010-03-29 ENCOUNTER — Encounter: Payer: Self-pay | Admitting: Cardiology

## 2010-04-05 ENCOUNTER — Encounter: Payer: Self-pay | Admitting: Internal Medicine

## 2010-04-05 ENCOUNTER — Encounter: Payer: Self-pay | Admitting: Cardiovascular Disease

## 2010-04-07 NOTE — Medication Information (Signed)
Summary: [facility]  Anticoagulant Therapy  Managed by: Bethena Midget, RN, BSN Referring MD: Arvilla Meres, MD PCP: Wyline Mood, [facility] Supervising MD: Daleen Squibb MD, Maisie Fus Indication 1: Atrial Fibrillation (ICD-427.31) Lab Used: self-tester [place] Site: [address] INR POC 2.9 INR RANGE 2 - 3  Dietary changes: no    Health status changes: no    Bleeding/hemorrhagic complications: no    Recent/future hospitalizations: no    Any changes in medication regimen? no    Recent/future dental: no  Any missed doses?: no       Is patient compliant with meds? yes       Allergies: 1)  ! Avelox 2)  ! Pcn  Anticoagulation Management History:      His anticoagulation is being managed by telephone today.  Positive risk factors for bleeding include an age of 75 years or older and presence of serious comorbidities.  The bleeding index is 'intermediate risk'.  Positive CHADS2 values include History of CHF, History of HTN, Age > 72 years old, and History of Diabetes.  The start date was 10/12/2006.  His last INR was 1.9.  Anticoagulation responsible provider: Daleen Squibb MD, Maisie Fus.  INR POC: 2.9.    Anticoagulation Management Assessment/Plan:      The target INR is 2 - 3.  The next INR is due 04/02/2010.  Anticoagulation instructions were given to patient.  Results were reviewed/authorized by Bethena Midget, RN, BSN.  He was notified by Bethena Midget, RN, BSN.         Prior Anticoagulation Instructions: INR 1.9 Today take 7.5mg s then resume 5mg s daily. Recheck in one week.   Current Anticoagulation Instructions: INR 2.9 Continue 5mg s everyday. Recheck in one week.

## 2010-04-12 ENCOUNTER — Encounter: Payer: Self-pay | Admitting: Internal Medicine

## 2010-04-12 ENCOUNTER — Encounter: Payer: Self-pay | Admitting: Cardiology

## 2010-04-13 NOTE — Medication Information (Signed)
Summary: [facility]  Anticoagulant Therapy  Managed by: Bethena Midget, RN, BSN Referring MD: Arvilla Meres, MD PCP: Wyline Mood, [facility] Supervising MD: Clifton James MD, Cristal Deer Indication 1: Atrial Fibrillation (ICD-427.31) Lab Used: self-tester [place] Site: [address] INR POC 2.0 INR RANGE 2 - 3  Dietary changes: no    Health status changes: no    Bleeding/hemorrhagic complications: no    Recent/future hospitalizations: no    Any changes in medication regimen? no    Recent/future dental: no  Any missed doses?: no       Is patient compliant with meds? yes      Comments: Pt states son didn't do INR on Friday, but did it today, thus called and got INR from pt daughter in law.   Allergies: 1)  ! Avelox 2)  ! Pcn  Anticoagulation Management History:      His anticoagulation is being managed by telephone today.  Positive risk factors for bleeding include an age of 75 years or older and presence of serious comorbidities.  The bleeding index is 'intermediate risk'.  Positive CHADS2 values include History of CHF, History of HTN, Age > 3 years old, and History of Diabetes.  The start date was 10/12/2006.  His last INR was 1.9.  Anticoagulation responsible provider: Clifton James MD, Cristal Deer.  INR POC: 2.0.    Anticoagulation Management Assessment/Plan:      The target INR is 2 - 3.  The next INR is due 04/09/2010.  Anticoagulation instructions were given to patient.  Results were reviewed/authorized by Bethena Midget, RN, BSN.  He was notified by Bethena Midget, RN, BSN.         Prior Anticoagulation Instructions: INR 2.9 Continue 5mg s everyday. Recheck in one week.   Current Anticoagulation Instructions: INR 2.0  Continue 5mg s daily. Recheck in one week.

## 2010-04-19 ENCOUNTER — Encounter: Payer: Self-pay | Admitting: Internal Medicine

## 2010-04-19 ENCOUNTER — Encounter: Payer: Self-pay | Admitting: Cardiology

## 2010-04-20 ENCOUNTER — Encounter: Payer: Self-pay | Admitting: Cardiology

## 2010-04-22 NOTE — Medication Information (Signed)
Summary: [facility]  Anticoagulant Therapy  Managed by: Cloyde Reams, RN, BSN Referring MD: Arvilla Meres, MD PCP: Wyline Mood, [facility] Supervising MD: Jens Som MD, Arlys John Indication 1: Atrial Fibrillation (ICD-427.31) Lab Used: self-tester [place] Site: [address] INR POC 1.3 INR RANGE 2 - 3           Allergies: 1)  ! Avelox 2)  ! Pcn  Anticoagulation Management History:      Positive risk factors for bleeding include an age of 75 years or older and presence of serious comorbidities.  The bleeding index is 'intermediate risk'.  Positive CHADS2 values include History of CHF, History of HTN, Age > 38 years old, and History of Diabetes.  The start date was 10/12/2006.  His last INR was 1.9.  Anticoagulation responsible provider: Jens Som MD, Arlys John.  INR POC: 1.3.  Exp: 11/2010.    Anticoagulation Management Assessment/Plan:      The target INR is 2 - 3.  The next INR is due 04/19/2010.  Anticoagulation instructions were given to patient.  Results were reviewed/authorized by Cloyde Reams, RN, BSN.  He was notified by Cloyde Reams RN.         Prior Anticoagulation Instructions: INR 2.0  Continue 5mg s daily. Recheck in one week.   Current Anticoagulation Instructions: INR 1.3  Called spoke with pt, pt is a self-tester.  Take 1.5 tablets today and tomorrow, then resume same dosage 1 tablet daily.  Recheck in 1 week.

## 2010-04-26 ENCOUNTER — Encounter: Payer: Self-pay | Admitting: Cardiology

## 2010-04-26 ENCOUNTER — Encounter: Payer: Self-pay | Admitting: Internal Medicine

## 2010-04-27 NOTE — Medication Information (Signed)
Summary: [facility]   [facility]   Imported By: Kassie Mends 04/21/2010 10:03:20  _____________________________________________________________________  External Attachment:    Type:   Image     Comment:   External Document

## 2010-04-27 NOTE — Medication Information (Signed)
Summary: [facility]   Anticoagulant Therapy  Managed by: Weston Brass, PharmD Referring MD: Arvilla Meres, MD PCP: Wyline Mood, [facility] Supervising MD: Daleen Squibb MD, Maisie Fus Indication 1: Atrial Fibrillation (ICD-427.31) Lab Used: self-tester [place] Site: [address] INR POC 2.1 INR RANGE 2 - 3  Dietary changes: no    Health status changes: no    Bleeding/hemorrhagic complications: no    Recent/future hospitalizations: no    Any changes in medication regimen? no    Recent/future dental: no  Any missed doses?: no       Is patient compliant with meds? yes       Allergies: 1)  ! Avelox 2)  ! Pcn  Anticoagulation Management History:      Positive risk factors for bleeding include an age of 21 years or older and presence of serious comorbidities.  The bleeding index is 'intermediate risk'.  Positive CHADS2 values include History of CHF, History of HTN, Age > 85 years old, and History of Diabetes.  The start date was 10/12/2006.  His last INR was 1.9.  Anticoagulation responsible provider: Daleen Squibb MD, Maisie Fus.  INR POC: 2.1.  Exp: 11/2010.    Anticoagulation Management Assessment/Plan:      The target INR is 2 - 3.  The next INR is due 04/26/2010.  Anticoagulation instructions were given to patient.  Results were reviewed/authorized by Weston Brass, PharmD.  He was notified by Weston Brass PharmD.         Prior Anticoagulation Instructions: INR 1.3  Called spoke with pt, pt is a self-tester.  Take 1.5 tablets today and tomorrow, then resume same dosage 1 tablet daily.  Recheck in 1 week.   Current Anticoagulation Instructions: INR 2.1  Spoke with pt. Continue same dose of 1 tablet every day.  Recheck INR in 1 week.

## 2010-05-03 ENCOUNTER — Encounter: Payer: Self-pay | Admitting: Internal Medicine

## 2010-05-04 NOTE — Medication Information (Signed)
Summary: [facility]  Anticoagulant Therapy  Managed by: Cloyde Reams, RN, BSN Referring MD: Arvilla Meres, MD PCP: Wyline Mood, [facility] Supervising MD: Riley Kill MD, Maisie Fus Indication 1: Atrial Fibrillation (ICD-427.31) Lab Used: self-tester [place] Site: [address] INR POC 1.9 INR RANGE 2 - 3  Dietary changes: no    Health status changes: no    Bleeding/hemorrhagic complications: no    Recent/future hospitalizations: no    Any changes in medication regimen? no    Recent/future dental: no  Any missed doses?: no       Is patient compliant with meds? yes       Allergies: 1)  ! Avelox 2)  ! Pcn  Anticoagulation Management History:      His anticoagulation is being managed by telephone today.  Positive risk factors for bleeding include an age of 81 years or older and presence of serious comorbidities.  The bleeding index is 'intermediate risk'.  Positive CHADS2 values include History of CHF, History of HTN, Age > 76 years old, and History of Diabetes.  The start date was 10/12/2006.  His last INR was 1.9.  Anticoagulation responsible provider: Riley Kill MD, Maisie Fus.  INR POC: 1.9.    Anticoagulation Management Assessment/Plan:      The target INR is 2 - 3.  The next INR is due 05/03/2010.  Anticoagulation instructions were given to patient.  Results were reviewed/authorized by Cloyde Reams, RN, BSN.  He was notified by Cloyde Reams RN.         Prior Anticoagulation Instructions: INR 2.1  Spoke with pt. Continue same dose of 1 tablet every day.  Recheck INR in 1 week.   Current Anticoagulation Instructions: INR 1.9 Today take 7.5mg s then resume 5mg s daily. Recheck in one week.  LMOM to call office for dosing. Bethena Midget, RN, BSN  April 27, 2010 9:32 AM Called spoke with pt he did received message and took 1.5 tablets yesterday, will continue on 1 tablet daily.  Recheck in 1 week. Cloyde Reams RN  April 27, 2010 12:34 PM

## 2010-05-10 LAB — POCT INR: INR: 2.1

## 2010-05-11 ENCOUNTER — Ambulatory Visit (INDEPENDENT_AMBULATORY_CARE_PROVIDER_SITE_OTHER): Payer: Self-pay | Admitting: Cardiology

## 2010-05-11 DIAGNOSIS — I4891 Unspecified atrial fibrillation: Secondary | ICD-10-CM

## 2010-05-11 NOTE — Patient Instructions (Signed)
INR 2.1 Instructed pt son to continue 5mg s daily except 7.5mg s on Mondays and Fridays. Recheck in one week, self-tester.

## 2010-05-17 ENCOUNTER — Encounter: Payer: Self-pay | Admitting: Internal Medicine

## 2010-05-17 ENCOUNTER — Ambulatory Visit: Payer: Self-pay | Admitting: Cardiovascular Disease

## 2010-05-17 LAB — POCT INR: INR: 2.7

## 2010-05-17 LAB — PROTIME-INR

## 2010-05-18 NOTE — Patient Instructions (Signed)
INR 2.7 Instructed pt  to continue 5mg s daily except 7.5mg s on Mondays and Fridays. Recheck in one week, self-tester.

## 2010-05-19 LAB — CBC
Hemoglobin: 12.9 g/dL — ABNORMAL LOW (ref 13.0–17.0)
RDW: 14.2 % (ref 11.5–15.5)

## 2010-05-19 LAB — POCT I-STAT, CHEM 8
BUN: 25 mg/dL — ABNORMAL HIGH (ref 6–23)
Calcium, Ion: 1.08 mmol/L — ABNORMAL LOW (ref 1.12–1.32)
Creatinine, Ser: 1.7 mg/dL — ABNORMAL HIGH (ref 0.4–1.5)
TCO2: 26 mmol/L (ref 0–100)

## 2010-05-19 LAB — COMPREHENSIVE METABOLIC PANEL
AST: 24 U/L (ref 0–37)
Alkaline Phosphatase: 40 U/L (ref 39–117)
BUN: 30 mg/dL — ABNORMAL HIGH (ref 6–23)
CO2: 27 mEq/L (ref 19–32)
Chloride: 100 mEq/L (ref 96–112)
Creatinine, Ser: 1.99 mg/dL — ABNORMAL HIGH (ref 0.4–1.5)
GFR calc non Af Amer: 32 mL/min — ABNORMAL LOW (ref >60)
Potassium: 5.1 mEq/L (ref 3.5–5.1)
Total Bilirubin: 0.6 mg/dL (ref 0.3–1.2)

## 2010-05-19 LAB — URINE CULTURE

## 2010-05-19 LAB — DIFFERENTIAL
Basophils Absolute: 0 10*3/uL (ref 0.0–0.1)
Basophils Relative: 0 % (ref 0–1)
Eosinophils Absolute: 0 10*3/uL (ref 0.0–0.7)
Eosinophils Relative: 1 % (ref 0–5)
Lymphocytes Relative: 21 % (ref 12–46)

## 2010-05-19 LAB — URINALYSIS, ROUTINE W REFLEX MICROSCOPIC
Bilirubin Urine: NEGATIVE
Nitrite: NEGATIVE
Protein, ur: NEGATIVE mg/dL
Specific Gravity, Urine: 1.014 (ref 1.005–1.030)
Urobilinogen, UA: 1 mg/dL (ref 0.0–1.0)

## 2010-05-19 LAB — GLUCOSE, CAPILLARY: Glucose-Capillary: 200 mg/dL — ABNORMAL HIGH (ref 70–99)

## 2010-05-19 LAB — PROTIME-INR: Prothrombin Time: 19.1 seconds — ABNORMAL HIGH (ref 11.6–15.2)

## 2010-05-24 ENCOUNTER — Ambulatory Visit: Payer: Self-pay | Admitting: Cardiology

## 2010-05-24 LAB — PROTIME-INR

## 2010-05-25 ENCOUNTER — Encounter: Payer: Self-pay | Admitting: Internal Medicine

## 2010-05-31 LAB — PROTIME-INR

## 2010-05-31 LAB — BASIC METABOLIC PANEL WITH GFR
BUN: 47 mg/dL — ABNORMAL HIGH (ref 6–23)
CO2: 30 meq/L (ref 19–32)
Chloride: 98 meq/L (ref 96–112)
Creatinine, Ser: 2.08 mg/dL — ABNORMAL HIGH (ref 0.4–1.5)

## 2010-05-31 LAB — BASIC METABOLIC PANEL
Calcium: 9.9 mg/dL (ref 8.4–10.5)
GFR calc Af Amer: 37 mL/min — ABNORMAL LOW (ref >60)
GFR calc non Af Amer: 31 mL/min — ABNORMAL LOW (ref >60)
Glucose, Bld: 139 mg/dL — ABNORMAL HIGH (ref 70–99)
Potassium: 4.2 mEq/L (ref 3.5–5.1)
Sodium: 140 mEq/L (ref 135–145)

## 2010-05-31 LAB — BRAIN NATRIURETIC PEPTIDE: Pro B Natriuretic peptide (BNP): 170 pg/mL — ABNORMAL HIGH (ref 0.0–100.0)

## 2010-06-01 ENCOUNTER — Encounter: Payer: Self-pay | Admitting: Internal Medicine

## 2010-06-01 ENCOUNTER — Ambulatory Visit: Payer: Self-pay | Admitting: Cardiology

## 2010-06-01 LAB — POCT INR: INR: 2.6

## 2010-06-07 LAB — PROTIME-INR

## 2010-06-08 ENCOUNTER — Ambulatory Visit: Payer: Self-pay | Admitting: Cardiology

## 2010-06-08 ENCOUNTER — Encounter: Payer: Self-pay | Admitting: Internal Medicine

## 2010-06-14 ENCOUNTER — Ambulatory Visit (INDEPENDENT_AMBULATORY_CARE_PROVIDER_SITE_OTHER): Payer: Self-pay | Admitting: Cardiology

## 2010-06-14 ENCOUNTER — Encounter: Payer: Self-pay | Admitting: Internal Medicine

## 2010-06-14 DIAGNOSIS — R0989 Other specified symptoms and signs involving the circulatory and respiratory systems: Secondary | ICD-10-CM

## 2010-06-14 LAB — POCT INR: INR: 2.3

## 2010-06-21 ENCOUNTER — Encounter: Payer: Self-pay | Admitting: Internal Medicine

## 2010-06-21 ENCOUNTER — Ambulatory Visit (INDEPENDENT_AMBULATORY_CARE_PROVIDER_SITE_OTHER): Payer: Self-pay | Admitting: Internal Medicine

## 2010-06-21 DIAGNOSIS — R0989 Other specified symptoms and signs involving the circulatory and respiratory systems: Secondary | ICD-10-CM

## 2010-06-29 NOTE — Assessment & Plan Note (Signed)
[facility]                          CHRONIC HEART FAILURE NOTE   Wayne Montoya, Wayne Montoya                        MRN:          [id number]  DATE:03/06/2008                            DOB:          04-27-1922    PRIMARY CARDIOLOGIST:  Rollene Rotunda, MD, [facility]   PRIMARY CARE PHYSICIAN:  Dr. Wyline Mood.   Wayne Montoya returns today for further followup of his congestive heart  failure which is secondary to diastolic dysfunction.  When I saw Wayne Montoya  back on the 7th, he had some volume overload and we ended up sending him  over to [facility] for IV diuresis and lab work and then when he was  stable and was discharged home.  Since that hospital visit, he has  complained of some lower back pain and stated he thought he was taking  too much of his diuretics, so I had him hold his Lasix on Sunday evening  and Monday morning and then restarted at 60 mg b.i.d. and come in today  for blood work and followup.  He states he is feeling better, but his  kidneys still seemed to be bothering him a little bit.  He denies any  dysuria, fever, chills, or flank pain.  His weight is back up 3 pounds;  however, otherwise he states he feels good.   PAST MEDICAL HISTORY:  1. Congestive heart failure secondary to diastolic dysfunction with a      normal EF.  2. Coronary artery disease status post CABG and atrial fib requiring      chronic Coumadin therapy.   REVIEW OF SYSTEMS:  As stated above.   CURRENT MEDICATIONS:  1. Vytorin 10/80.  2. Coumadin.  3. Aspirin 81.  4. Amlodipine/benazepril 10/40 two tablets daily.  5. Lantus insulin.  6. Flomax.  7. Multivitamin.  8. Carvedilol 12.5 b.i.d.  9. Janumet 50/1000 b.i.d.  10.KCl 20 mEq 3 in the a.m., 2 in the evening.  11.Lasix 80 in the morning, 40 in the evening.   PHYSICAL EXAMINATION:  VITAL SIGNS:  Weight 203 pounds, blood pressure  105/69 with heart rate of 50.  GENERAL:  Wayne Montoya is in no acute distress.  NECK:   No signs of jugular vein distention at 45-degree angle.  LUNGS:  Clear to auscultation bilaterally.  CARDIOVASCULAR:  S1 and S2.  Regular rate and rhythm.  2/6 systolic  ejection murmur noted.  ABDOMEN:  Obese, soft, nontender.  Positive bowel sounds.  LOWER EXTREMITIES:  Without clubbing, cyanosis, or edema.  NEUROLOGICAL:  Alert and oriented x3.   IMPRESSION:  Congestive heart failure without signs of volume overload.  I am going to cut his Lasix back to 80 in the morning and 40 in the  evening or afternoon.  Check urine on him today.  I have also received a  copy of blood work that was drawn by Home Health early this morning.  It  shows potassium is now 4.5, creatinine 2.06, which is stable.  BMP of  the range 1480, but their normal range is 0-1800.  We will review  results  of urine and blood work with the patient and his son, [name], and  see him back in followup in a month.      [name], ACNP  Electronically Signed      Rollene Rotunda, MD, [facility]  Electronically Signed   MB/MedQ  DD: 03/07/2008  DT: 03/08/2008  Job #: [id number]   cc:   Dr. Wyline Mood

## 2010-06-29 NOTE — Assessment & Plan Note (Signed)
[facility]                          CHRONIC HEART FAILURE NOTE   NAME:Wayne Montoya, Wayne Montoya                        MRN:          [id number]  DATE:04/11/2007                            DOB:          [birth year]    PRIMARY CARDIOLOGIST:  Dr. Nicholes Mango.   PRIMARY CARE PHYSICIAN:  Dr. Wyline Mood in [place].   [name] returns today for follow-up of his congestive heart failure  which is secondary to diastolic dysfunction. He has a history of  coronary artery disease status post CABG with chronic atrial fib. He is  status post recent hospitalization for implantation of a CardioMEMS  pulmonary artery sensory device as part of the [place] trial for  invasive management of his congestive heart failure.  Mr.  Paule was  discharged home on February 11 status post right heart catheterization  with CardioMEMS placement by Dr. [name] on February 10. [name]  denies any problems since being discharged home. The right groin post  cath site stable. He also denies any lightheadedness, dizziness,  presyncope or syncopal episodes. Following his CardioMEMS pulmonary  artery sensory device, the patient's diastolic pressures have been  mildly elevated at 26 and 27. I had Maureen Ralphs, the research nurse here at  the office, call and speak with the patient yesterday. [name] states  his weight was up a few pounds and he was mildly short of breath.  I  asked her to give him instructions to increase his Lasix to 80 mg b.i.d.  which [name] did. His weight is down 4 pounds today.   PAST MEDICAL HISTORY:  1. Congestive heart failure secondary to diastolic dysfunction with a      normal EF.  2. Coronary artery disease status post CABG.  3. Hypertension.  4. Dyslipidemia.  5. Atrial fibrillation  6. 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  7. Diabetes.  8. Status post appendectomy.  9. Status post  cholecystectomy.  10.History of tobacco use, quit in 1967.  11.BPH.  12.History of depression.  13.Status post prostate biopsy/prostate cancer followed by Dr.      Patsi Sears.  14.Chronic carotid bruits status post Doppler study in 2006 showing      mild plaque in the bulbs and proximal bifurcation, left greater      than right.  Right ICA 0-39%, left ICA 40-59% stenosis.  15.Chronic renal insufficiency with baseline creatinine 1.6.  16.Chronic vascular insufficiency and lower extremities.  17.Chronic anticoagulation therapy.   REVIEW OF SYSTEMS:  As stated above, otherwise negative.   CURRENT MEDICATIONS:  1. Lantus 10 units q.h.s.  2. Coreg 9.375 mg b.i.d.  3. Lasix 80 mg b.i.d.  4. Kay Ciel 20 mEq 2 tablets b.i.d.  5. Metformin 500 mg b.i.d.  6. Januvia unclear dose at this time.  7. Vytorin 10/80 mg daily.  8. Rocaltrol 0.25 mg daily.  9. Multivitamin daily.  10.Warfarin as directed.  11.Aspirin 81 mg daily.  12.Amlodipine/benazepril 10/40 mg b.i.d.   P.R.N MEDICATIONS:  Metolazone when instructed to take.  PHYSICAL EXAMINATION:  Weight 216 pounds, blood pressure 147/80 with a  heart rate of 60.  [name] is in no acute distress today.  NECK:  Supple. JVD around 8 cm.  CARDIOVASCULAR:  S1 and S2, irregular rhythm.  ABDOMEN:  Soft, nontender, positive bowel sounds.  LOWER EXTREMITIES:  Without clubbing or cyanosis.  The patient has 1+  edema bilaterally.  NEUROLOGIC:  Alert and oriented x3. Affect is pleasant.   IMPRESSION:  Congestive heart failure secondary to diastolic dysfunction  with mild volume overload.  Will go ahead and continue the Lasix at 80  mg b.i.d. and check blood work on him.  Cath site to right groin also  evaluated, no bruit, no hematoma.  Will continue to monitor the patient  via the CardioMEMS pulmonary artery sensor device.  Will adjust  medications per subjective and objective findings. Also have him  increase his carvedilol to 12.5 mg  b.i.d. Have him follow up with Dr.  [name] for a routine cardiology visit  next month.  Will also check a  BMET and a BNP today secondary to adjustments in medications and make  sure carbon copy is sent to Dr. Orvan Falconer down in [place].      [name], ACNP  Electronically Signed      Rollene Rotunda, MD, [facility]  Electronically Signed   MB/MedQ  DD: 04/11/2007  DT: 04/12/2007  Job #: [id number]   cc:   Wyline Mood, MD

## 2010-06-29 NOTE — Assessment & Plan Note (Signed)
[facility]                          CHRONIC HEART FAILURE NOTE   NAME:Wayne Montoya, Wayne Montoya                        MRN:          [id number]  DATE:09/18/2007                            DOB:          [birth year]    PRIMARY CARDIOLOGIST:  Bevelyn Buckles. Bensimhon, MD   PRIMARY CARE:  Dr. Wyline Mood.   Wayne Montoya returns today for followup of his congestive heart failure  which is secondary to diastolic dysfunction.  Wayne Montoya is accompanied  by his son, [name].  Wayne Montoya states he has been seen in the ER since I  last saw him.  He had another episode of lightheadedness and dizziness  upon standing after lying in bed, fell in his bedroom, hit his right  side on the dresser, has a large ecchymotic area there.  He states he  had x-rays done in the emergency room and was told nothing was fractured  and to follow up if there were any further problems with his primary  care doctor.  [name] is concerned that Wayne Montoya is not tolerating the  hydralazine and this contributing to the 2 episodes of orthostatic  hypotension.  If you recall, back in earlier this spring after titrating  his hydralazine dose up Wayne Montoya had an episode where he fell and  ultimately fractured his left humerus, had some problems with arthritis  in that arm, now with limited range of motion.  Otherwise, he denies any  symptoms of presyncope, syncope, orthopnea, PND, or peripheral edema.  He continues to transmit daily CardioMEMS pulmonary artery sensory  device readings.  His diastolic pressure today is 23 per CardioMEMS  reading, this is his baseline.   PAST MEDICAL HISTORY:  1. Congestive heart failure secondary to diastolic dysfunction, with a      normal EF confirmed by echocardiogram.  2. Coronary artery disease, status post CABG.  3. Hypertension.  4. Atrial fibrillation.  5. Dyslipidemia.   REVIEW OF SYSTEMS:  As stated above, otherwise negative.   CURRENT MEDICATIONS:  1. Vytorin  10/80.  2. Coumadin as directed.  3. Aspirin 81.  4. Amlodipine.  5. Benazepril 10/40 daily.  6. Lantus insulin as directed.  7. Flomax 0.4.  8. Multivitamin daily.  9. Carvedilol 12.5 b.i.d.  10.Hydralazine 18.75 mg t.i.d.  11.KCl 40 mEq b.i.d.  12.Lasix 80 mg b.i.d.  13.Janumet 50/100 mg b.i.d.   PHYSICAL EXAMINATION:  VITAL SIGNS:  Weight 194 pounds, blood pressure  118/64 with a heart rate of 68.  Orthostatic vital signs:  Lying heart  rate 61, blood pressure 116/68; sitting 66, blood pressure 109/64;  standing at 0 minutes heart rate 65, blood pressure 101/59, the patient  complained of dizziness; 2 minutes heart rate 61 and blood pressure  107/62, asymptomatic; 5 minutes, heart rate 64 and blood pressure  108/63, asymptomatic.  GENERAL:  Wayne Montoya is in no acute distress.  No signs of jugular vein  distention at 45-degree angle.  LUNGS:  Clear to auscultation bilaterally.  CARDIOVASCULAR:  Reveals S1 and S2, 2/6 systolic ejection murmur noted.  ABDOMEN:  Soft, nontender,  and positive bowel sounds.  LOWER EXTREMITIES:  Without clubbing, cyanosis, or edema.  NEUROLOGICAL:  Alert and oriented x3.   IMPRESSION:  Congestive heart failure secondary to diastolic dysfunction  without signs of volume overload at this time.  Wayne Montoya has had  another episode of dizziness resulting in a fall.  As he is on Coumadin,  I have concerns about continuing the hydralazine therapy as well as [name],  his son, states he is not comfortable with him being on it, so we are  going to stop the hydralazine today.  Orthostatics as stated above.  I  have encouraged Wayne Montoya to start using a 4-prong cane at night when he  gets up to go to the bathroom.  Also, I am going to put him on a 24-hour  blood pressure monitor; we will set this up today before he leaves.  I  have encouraged him to continue transmitting his CardioMEMS readings  daily.  We will adjust medications around his symptoms and pressures  if  needed.      [name], ACNP  Electronically Signed      Rollene Rotunda, MD, [facility]  Electronically Signed   MB/MedQ  DD: 09/18/2007  DT: 09/19/2007  Job #: [id number]   cc:   Wyline Mood, MD

## 2010-06-29 NOTE — Assessment & Plan Note (Signed)
[facility]                          CHRONIC HEART FAILURE NOTE   Wayne Montoya, Wayne Montoya                        MRN:          [id number]  DATE:01/01/2008                            DOB:          [birth year]    PRIMARY CARE PHYSICIAN:  Junious Dresser, MD.   PRIMARY CARDIOLOGIST:  Rollene Rotunda, MD, [facility].   Wayne Montoya returns today for further followup of his congestive heart  failure, which is secondary to diastolic dysfunction.  He still has been  doing quite well since I last saw him in September.  He is followed up  with Dr. Antoine Poche since that time for a routine cardiology visit.  At  that appointment with Dr. Antoine Poche, the patient's fluid volume status  was noted to be up.  His CardioMEMS was unchanged.  Dr. Antoine Poche checked  blood work and increased his Lasix back to 120 mg in the morning and 80  in the afternoon.  I scheduled him to followup here.  Lab work checked  on December 21, 2007, shows a potassium of 4.2, a BUN and creatinine of  22 and 1.3.  I do not see that a BNP was checked.  Last BNP documented  on December 12, 2007, was 237.  Wayne Montoya does have the tendency to eat  and have some dietary indiscretion if he eats out, but Wayne Montoya states  he had a chicken fillet sandwich yesterday he was out and is complaining  of some abdominal fullness today, although his weight is down 4 pounds  since his visit with Dr. Antoine Poche and change in his diuretics.  Interpretation of his CardioMEMS pulmonary artery sensory device today  shows a diastolic pressure of 28, which is up for him, he usually hangs  around 23-25.   PAST MEDICAL HISTORY:  1. Congestive heart failure, secondary diastolic dysfunction with a      normal EF.  2. Coronary artery disease, status post CABG.  3. Atrial fibrillation, requiring chronic anticoagulation therapy.   REVIEW OF SYSTEMS:  As stated above.   CURRENT MEDICATIONS:  1. Vytorin 10/80 daily.  2. Coumadin as  directed.  3. Aspirin 81.  4. Amlodipine/benazepril 10/40 two tablets daily.  5. Lantus as directed.  6. Flomax 0.4 daily.  7. Multivitamin daily.  8. Carvedilol 12.5 b.i.d.  9. KCl 40 mEq b.i.d.  10.Janumet 50/100 mg b.i.d.  11.Lasix 120 in the morning and 80 in the evening.   PHYSICAL EXAMINATION:  VITAL SIGNS:  Weight 202, weight is down 4 pounds  from 2006; blood pressure 106/57 in the left arm; and heart rate of 56.  GENERAL:  Wayne Montoya is in no acute distress.  NECK:  He has no signs of jugular vein distention at 45-degree angle.  LUNGS:  Clear to auscultation.  CARDIOVASCULAR:  An S1 and an S2.  He is a 2/6 systolic ejection murmur  noted.  ABDOMEN:  Obese, positive bowel sounds.  Mild distention.  LOWER EXTREMITIES:  Without clubbing and cyanosis.  He has a trace  edema.  NEUROLOGIC:  Alert and oriented x3.   IMPRESSION:  Congestive heart failure secondary to diastolic dysfunction  with signs of volume overload at this time.  We will continue the Lasix  at 120 mg in the morning and 80 in the evening.  I am going to add  metolazone 2.5 mg for the next 2 mornings 30 minutes prior to his Lasix  and he will have his home health nurse through [facility] to check a  BMET and BNP next Monday and fax me the results.      [name], ACNP  Electronically Signed      Bevelyn Buckles. Bensimhon, MD  Electronically Signed   MB/MedQ  DD: 01/01/2008  DT: 01/02/2008  Job #: [id number]   cc:   Junious Dresser, MD

## 2010-06-29 NOTE — Assessment & Plan Note (Signed)
[facility]                            CARDIOLOGY OFFICE NOTE   Wayne Montoya                        MRN:          [id number]  DATE:01/21/2008                            DOB:          1922/09/06    PRIMARY CARE PHYSICIAN:  Wayne Dresser, MD   INTERVAL HISTORY:  Wayne Montoya is a very pleasant 77 year old male with a  history of congestive heart failure secondary to diastolic dysfunction.  He also has coronary artery disease status post CABG.  Remainder of  medical history is notable for hypertension, hyperlipidemia, and chronic  atrial fibrillation as well as mild carotid artery stenosis and  diabetes.   He returns today with his son, Wayne Montoya for followup.  Overall, he is doing  pretty well.  His lower extremity edema has been up and down.  He has  not had any chest pain, no significant shortness of breath, no  orthopnea, no PND.  He has considered taking extra metolazone in the  past when his stomach feels bloated, but he has not done so.   CURRENT MEDICATIONS:  1. Vytorin 10/80.  2. Coumadin.  3. Aspirin 81.  4. Lotrel 10/40 two tablets daily.  5. Lantus insulin.  6. Flomax 0.4.  7. Coreg 12.5 b.i.d.  8. Potassium 40 b.i.d.  9. Janumet.  10.Lasix 120 b.i.d.  11.Potassium 60 in the morning and 40 at night.  12.Metolazone p.r.n.   PHYSICAL EXAMINATION:  GENERAL:  He is well appearing, in no acute  distress.  He ambulates around the clinic without any respiratory  difficulty.  VITAL SIGNS:  Blood pressure is 124/54, heart rate is 65, weight is 205,  which is up 3 pounds.  HEENT:  Normal except for left facial droop.  NECK:  Supple.  There is no obvious JVD.  Carotids are 2+ bilaterally  with bruit on the right.  There is no lymphadenopathy or thyromegaly.  CARDIAC:  PMI is nondisplaced.  He is irregular.  2/6 systolic ejection  murmur at the left sternal border.  LUNGS:  Decreased air movement throughout.  No wheezes or rales.  ABDOMEN:  Obese.  EXTREMITIES:  Warm with no cyanosis or clubbing.  There is 1 to 2+ edema  left greater than right.  No rash.  NEUROLOGIC:  Alert and oriented x3.  Cranial nerves II through XII  grossly intact except for the facial droop.  Moves all 4 extremities  without difficulty.  Affect is pleasant.   CardioMEMS pressure sensor review shows that his pulmonary artery  diastolics have been running between 19 and 26, most recently in the 23  range.   ASSESSMENT/PLAN:  1. Chronic diastolic heart failure.  He is NYHA class III.  His volume      status is a little bit up today.  After some discussion, we have      determined that anytime his weight gets 200 pounds on his home      scale.  He will take metolazone tablet.  He should not have to do      this in more than once every  7-10 days, if he is doing more than      that I would consider switching him to [facility].  2. Coronary artery disease, this is stable.  No evidence of ischemia.      Continue current therapy.  3. Hypertension, well controlled.  4. Hyperlipidemia, followed by Wayne Montoya.  Goal LDL is less than      70.  5. Carotid bruits.  He has mild plaque flare.  We will continue to      follow this on an as scheduled basis.  6. Atrial fibrillation.  Continue Coumadin.   DISPOSITION:  We will see him back for routine followup in several  months.  He will continue to follow up with Wayne Montoya in the Heart  Failure Clinic.  Overall, I think he is doing quite well.     Wayne Buckles. Bensimhon, MD  Electronically Signed    DRB/MedQ  DD: 01/21/2008  DT: 01/22/2008  Job #: [id number]   cc:   Wayne Dresser, MD

## 2010-06-29 NOTE — Assessment & Plan Note (Signed)
[name]                            CARDIOLOGY OFFICE NOTE   Wayne Montoya, GANDER                        MRN:          [id number]  DATE:09/06/2007                            DOB:          April 04, 1922    PRIMARY CARE PHYSICIAN:  Dr. Junious Dresser.   HISTORY:  Wayne Montoya is a very pleasant 77 year old male with a history  of diastolic heart failure as well as coronary artery disease status  post CABG.  He also has diabetes, hypertension, hyperlipidemia, and  chronic atrial fibrillation.  He returns today with his son for routine  followup.   He has been having problems with volume overload since his shoulder  surgery.  However, he is now getting better.  His Lasix has been  adjusted by  [name] under the guidance of his CardioMEMS  pulmonary artery pressure sensor.  He denies any orthopnea or PND.  Overall he is feeling pretty well.   CURRENT MEDICATIONS:  Vytorin 10/80, Coumadin, aspirin 81, Lotrel 10/40,  Lantus insulin, Flomax, multivitamin, Coreg 12.5 b.i.d., hydralazine  18.75 t.i.d., Lasix 80 mg b.i.d., potassium 40 b.i.d., and Janumet  50/1000 b.i.d.   PHYSICAL EXAMINATION:  GENERAL:  He is an elderly male in no acute  distress ambulates in the clinic slowly without any respiratory  difficulty.  VITAL SIGNS: Blood pressure is 117/67, heart rate 78, and weight is 195.  HEENT:  Notable for a left facial droop secondary to Bell palsy,  otherwise normal.  NECK:  Supple.  No JVD.  Carotids are 2+ bilaterally with bilateral soft  bruits.  No lymphadenopathy or thyromegaly.  CARDIAC:  He is regular.  He has a 2/6 systolic ejection murmur at the  left sternal border.  No rubs or gallops.  LUNGS:  Clear.  ABDOMEN:  Obese, nontender, and nondistended.  No hepatosplenomegaly.  No bruits.  No masses.  EXTREMITIES:  Warm with no cyanosis or clubbing.  There is trace edema.  NEUROLOGIC:  Alert and oriented x3.  Cranial nerves II through XII  are  intact except for his facial droop.  Moves all fours extremities without  difficulty.  Affect is pleasant.   ASSESSMENT AND PLAN:  1. Congestive heart failure.  Volume status looks much better.      Continue current therapy.  2. Atrial fibrillation.  This is chronic.  Continue Coumadin.  3. Coronary artery disease, stable with no evidence of ischemia.   Overall Wayne Montoya is doing well.  He will continue to follow with  [name] in the Heart Failure Clinic for volume management.     Bevelyn Buckles. Bensimhon, MD  Electronically Signed    DRB/MedQ  DD: 09/06/2007  DT: 09/07/2007  Job #: [id number]

## 2010-06-29 NOTE — Assessment & Plan Note (Signed)
[facility]                            CARDIOLOGY OFFICE NOTE   ABDOULAYE, DRUM                        MRN:          [id number]  DATE:01/19/2007                            DOB:          02-12-1923    PRIMARY CARE PHYSICIAN:  Dr. Romero Belling.   INTERVAL HISTORY:  Mr. Perezperez is a very pleasant 77 year old male with  multiple medical problems including congestive heart failure secondary  to an ischemic cardiomyopathy now with a normalized EF.  He also has a  history of coronary artery disease status post CABG, chronic atrial  fibrillation, diabetes, hypertension, hyperlipidemia, chronic renal  failure and previous stroke.  He has been followed closely in the heart  failure clinic by [name].   He returns today for routine followup.  He says he is feeling very well.  He denies any chest pain or shortness of breath.  He is  getting around  able to do all of his daily activities.  He does complain of the fact  that he has to urinate every 2 hours and only has a small volume.  He  was previously on Flomax but this was stopped by his urologist as he was  told he did not need it any more after surgery for his prostate cancer.  He does note that he has been having some lower extremity edema.  Weight  has been up about 15 pounds.   CURRENT MEDICATIONS:  1. Amlodipine/benazepril 10/40 daily.  2. Vytorin 10/80.  3. Insulin.  4. Coreg 3.125 b.i.d.  5. Lasix 40 q.a.m. and 40 at night.  6. Potassium 40 q.a.m. and 20 at night.  7. Coumadin.  8. Aspirin 81.   PHYSICAL EXAMINATION:  GENERAL:  He is an elderly male in no acute  distress.  His weight is on the clinical exam. Respiratory difficulty.  VITAL SIGNS:  Blood pressure is 130/80, heart rate 72, weight is 215  which is up about 8 pounds from previous.  HEENT:  Normal.  NECK:  Supple, it is very hard to see his JVD, looks like it may be  mildly elevated in a 7 cm range.  Carotids are 2+  bilaterally without  bruits, there is no lymphadenopathy or thyromegaly.  CARDIAC:  The PMI is non displaced.  He has no regular rate and rhythm  with a very soft apical systolic murmur.  LUNGS:  Clear.  ABDOMEN:  Soft, nontender, nondistended, no hepatosplenomegaly, no  bruits, no masses, good bowel sounds.  EXTREMITIES:  Warm with no clubbing, cyanosis, there is 2-3+ edema  bilaterally. No rash.  NEURO:  Alert and oriented x3, cranial nerves II-XII are intact.  Moves  all four extremities without difficulty.  Affect is pleasant.   EKG:  Shows atrial fibrillation at a rate of 72 beats per minute with  PVC.  Previous inferior infarct.   ASSESSMENT:  1. Acute on chronic diastolic heart failure.  He has significant      volume overload today. We will go ahead and add metolazone 2.5 mg      daily for the  next 3 days with supplemental potassium.  Recheck is      potassium and renal function next week.  If he continues to have      problems with volume overload, we can consider increasing his Lasix      to 80 b.i.d.  2. Coronary artery disease, stable without any events of ischemia.  3. Hypertension, blood pressure is elevated, will increase his Coreg      to 6.25 b.i.d.  4. Hypercholesterolemia. He is due for a lipid panel and will get this      next week when he is fasting.  Goal LDL is less than 70.  5. Urinary retention/BPH.  Will restart him on his Flomax.   DISPOSITION:  Will see him back in a month or so for further evaluation.  He will continue to follow in the Heart Failure Clinic.     Bevelyn Buckles. Bensimhon, MD  Electronically Signed    DRB/MedQ  DD: 01/19/2007  DT: 01/19/2007  Job #: [id number]   cc:   Gregary Signs A. Everardo All, MD

## 2010-06-29 NOTE — Assessment & Plan Note (Signed)
[facility]                          CHRONIC HEART FAILURE NOTE   NAME:Wayne Montoya, Niblack                        MRN:          [id number]  DATE:02/21/2008                            DOB:          [birth year]    PRIMARY CARE PHYSICIAN:  Junious Dresser, MD   PRIMARY CARDIOLOGIST:  Rollene Rotunda, MD, [facility]   HISTORY:  [name] returns today for further followup of his congestive  heart failure, which is secondary to diastolic dysfunction.  He has been  doing quite well since I last saw him in November.  He is here today for  a CardioMEMS followup and following along well with Mr. Bonelli  CardioMEMS home monitoring system.  Diastolic pressures have maintained  between 18 and 23, just been consistent for him.  He is complaining of  some mild abdominal distention and lower extremity edema today.  However  in talking with him, he apparently had a chicken biscuit this morning  for breakfast and the son states that fluid seems to have gotten worse  over the last couple of hours since this morning.  He denies any  orthopnea or PND.  Denies any lightheadedness, dizziness, or  palpitations.   PAST MEDICAL HISTORY:  1. Congestive heart failure secondary to diastolic dysfunction with a      normal EF.  2. Coronary artery disease, status post CABG.  3. Atrial fibrillation with chronic anticoagulation therapy.   REVIEW OF SYSTEMS:  As stated above.   CURRENT MEDICATIONS:  1. Vytorin 10/80.  2. Coumadin per Coumadin Clinic.  3. Aspirin 81.  4. Amlodipine/benazepril 10/40 mg 2 tablets daily.  5. Lantus insulin as directed.  6. Flomax 0.4.  7. Multivitamin daily.  8. Carvedilol 12.5 mg b.i.d.  9. Potassium 20 mEq 3 tablets in the morning and 2 in the evening.  10.Lasix 120 mg b.i.d.  11.Janumet 50/1000 mg b.i.d.   PHYSICAL EXAMINATION:  VITAL SIGNS:  Weight 199.8 pounds, temperature  98.1, blood pressure 117/71, and heart rate 75.  GENERAL:  [name] is  in no acute distress.  NECK:  He has no signs of jugular vein distention at 45-degree angle.  LUNGS:  Clear to auscultation.  CARDIOVASCULAR:  An S1 and S2, 2/6 systolic ejection murmur noted.  ABDOMEN:  Obese, distended, positive bowel sounds, nontender.  LOWER EXTREMITIES:  Pitting +2 edema bilaterally.  NEUROLOGIC:  Alert and oriented x3.   IMPRESSION:  Congestive heart failure secondary to diastolic dysfunction  with signs of volume overload at this time.  Initially, planned on  giving 80 mg of Lasix IV here in the clinic, but secondary to unable to  be obtain IV access.  I have decided to have him take 2.5 of metolazone  prior to his Lasix for the next 2 mornings and then Tam, his son will  call me on Saturday and let me know how his weight is and his fluid  status and I will plan on seeing him back in 1 week.  If no improvement,  we will probably admit for 48 hours for IV diuresis.  [name], ACNP  Electronically Signed      Rollene Rotunda, MD, [facility]  Electronically Signed   MB/MedQ  DD: 02/21/2008  DT: 02/22/2008  Job #: [id number]

## 2010-06-29 NOTE — Discharge Summary (Signed)
NAME:  Wayne Montoya, Wayne Montoya                 ACCOUNT NO.:  [id number]   MEDICAL RECORD NO.:  [id number]          PATIENT TYPE:  OIB   LOCATION:  6532                         FACILITY:  [facility]   PHYSICIAN:  Bevelyn Buckles. Bensimhon, MDDATE OF BIRTH:  [birth year]   DATE OF ADMISSION:  03/27/2007  DATE OF DISCHARGE:  03/28/2007                         DISCHARGE SUMMARY - REFERRING   DISCHARGE DIAGNOSIS:  1. Recent admission for acute on chronic diastolic heart failure.  2. Thrombocytopenia.  3. Heparin antibody positive; however, HIT panel was negative.  4. Chronic atrial fibrillation with anticoagulation managed at      Coumadin Clinic.  5. Renal insufficiency, stable.   History as noted below.   PROCEDURES PERFORMED:  Right heart catheterization with CardioMEMS  placement by Dr. [name] on March 27, 2007.   SUMMARY OF HISTORY:  Wayne Montoya is an 77 year old white male who was  recently hospitalized from February 3 to March 23, 2007, with acute on  chronic diastolic heart failure exacerbation requiring IV diuretics..  Initial plan was for CardioMEMS.  However, the patient was discharged  home while a HIT panel was pending for further evaluation of his  thrombocytopenia.  HIT panel came back as negative.  Thus, he was  admitted for CardioMEMS placement.   PAST MEDICAL HISTORY:  In addition to the above is notable for chronic  atrial fibrillation maintained on Coumadin therapy, thrombocytopenia  status post bypass surgery, renal insufficiency, hypertension,  hyperlipidemia   LABORATORY:  On admission to [facility] hemoglobin 13.8, hematocrit  41.1, normal indices, platelets 112, WBC 6.9.  On February 11,  hemoglobin 13.8, hematocrit 41.6, normal indices.  Platelets were  clumping.  WBC 5.9.  ABG during catheterization showed a pH of 7.384,  pCO2 47.6, pO2 of 72, 94% saturation.  Admission PTT was 30, PT 14.2,  INR 1.1. Sodium 138, potassium 4.5, BUN 29, creatinine 1.81, glucose  204.  On  February 11, sodium was 140, potassium 3.8, BUN 24, creatinine  1.52, glucose 158.   HOSPITAL COURSE:  The patient underwent right heart catheterization by  Dr. [name] with placement of CardioMEMS implant. Dr. [name] felt  that the patient tolerated it well, and the device was placed easily.  By December 11, the patient was ambulating without difficulty.  Catheterization site was intact.  Labs were stable.  Thus, Dr. [name]  felt that the patient could be discharged home.   DISPOSITION:  The patient is discharged home.  He is advised wound care  and activities per supplemental sheet, to maintain low-sodium, heart-  healthy ADA diet.  His medications remain unchanged.  These include:  1. 80 of Lasix in the morning, 40 in the evening.  2. Coumadin 2.5 mg daily.  3. Coreg 9.375 mg b.i.d.  4. K-Dur 40 mEq in the morning, 20 in the evening.  5. Vytorin 10/80 mg nightly.  6. Rocaltrol 0.25 mg daily.  7. Multivitamin daily.  8. Aspirin 81 daily.  9. Norvasc/benazepril 10/40 daily.  10.Flomax 0.5 mg daily.  11.Lantus 10 units nightly.  12.Humalog sliding scale as previously.   He was asked to have  a PT/INR on April 03, 2007. at 8:45. Bring all  medicines and weights to all followup appointments.  He will follow up  with Dr. [name] on April 11, 2007, and 2:45.   Discharge time:  35 minutes.      Wayne Rued, PA-C      Bevelyn Buckles. Bensimhon, MD  Electronically Signed    EW/MEDQ  D:  03/28/2007  T:  03/28/2007  Job:  [id number]   cc:   Gregary Signs A. Everardo All, MD

## 2010-06-29 NOTE — Assessment & Plan Note (Signed)
[facility]                          CHRONIC HEART FAILURE NOTE   NAME:[name], Montoya                        MRN:          [id number]  DATE:08/07/2007                            DOB:          1923/02/01    PRIMARY CARDIOLOGIST:  Bevelyn Buckles. Bensimhon, MD.   PRIMARY CARE:  Dr. Wyline Mood.   Wayne Montoya returns today for followup of his congestive heart failure  which is secondary to diastolic dysfunction in the setting of a normal  ejection fraction.  Since I last saw Wayne Montoya in May he has been doing  quite well, continues to transmit his CardioMEMS pulmonary artery  pressures daily.  This had to be put on hold for a period of time  secondary to a left humerus fracture, with increased pain and  immobilization; he states this is doing quite well.  He is doing  physical therapy with this.  He is out of his sling now.  He has moved  to [place] to be closer to his son Wayne Montoya, who is a Engineer, civil (consulting) on [employer] at  [employer].  Overall, he has been doing quite well.   PAST MEDICAL HISTORY:  1. Congestive heart failure secondary to diastolic dysfunction with a      normal EF.  2. Coronary artery disease status post CABG.  3. History of hypertension.  4. Dyslipidemia.  5. Atrial fibrillation.  6. History of questionable Bell's palsy versus CVA with residual left      facial droop.  7. Diabetes.  8. Remote history of tobacco use.  9. BPH.  10.Depression  11.Chronic carotid bruits, previous workup.  12.Chronic renal insufficiency with a baseline creatinine of 1.6.  13.Chronic anticoagulation therapy.  14.Status post recent ABIs showing no evidence of segmental lower      extremity arterial disease at rest bilaterally and normal ABIs per      Dr. Excell Seltzer.   REVIEW OF SYSTEMS:  As stated above, otherwise negative.   CURRENT MEDICATIONS:  1. Carvedilol 12.5 mg b.i.d.  2. Lasix 80 mg daily.  3. Janumet 50/1000 mg b.i.d.  4. KCl 20 mEq 2 tablets b.i.d.  5.  Hydralazine 12.5 mg t.i.d.  6. Lantus as directed.  7. Vytorin 10/80 daily.  8. Multivitamin daily.  9. Coumadin as directed.  10.Aspirin 81 daily.  11.Amlodipine and benazepril 10/40 daily.  12.Flomax 0.4 mg daily.  13.P.r.n. medication Percocet.   PHYSICAL EXAMINATION:  VITAL SIGNS:  Weight 199 pounds, weight is up 3  pounds, blood pressure 124/68 with a heart rate of 70.  GENERAL:  Wayne Montoya is in no acute distress.  No signs of jugular vein  distention at 45-degree angle.  LUNGS:  Clear to auscultation bilaterally.  CARDIOVASCULAR:  S1 and S2, 2/6 systolic ejection murmur noted.  ABDOMEN:  Soft, nontender, positive bowel sounds.  LOWER EXTREMITIES:  Without clubbing or cyanosis.  He has +1 nonpitting  edema to his bilateral extremities, the left being greater than the  right.  NEUROLOGICAL:  Alert and oriented x3.   IMPRESSION:  Congestive heart failure secondary to diastolic dysfunction  without  signs of volume overload at this time.  Continue current  medications.  We will not attempt to titrate hydralazine dose again as  the patient had a syncopal episode when hydralazine dose was titrated up  the last time; this was felt to be a contributing factor to his  subsequent fall and left humerus fracture.  I have spoken with the  patient and his son Wayne Montoya.  They are both in agreement they do not want to  titrate medications any further.  I will cut his potassium back to 20  mEq b.i.d.  Continue to transmit CardioMEMS readings daily, and I will  continue to monitor and see him back in 6 weeks, sooner if he has any  problems.      [name], ACNP  Electronically Signed      Bevelyn Buckles. Bensimhon, MD  Electronically Signed   MB/MedQ  DD: 08/07/2007  DT: 08/08/2007  Job #: [id number]   cc:   Junious Dresser, M.D.

## 2010-06-29 NOTE — Consult Note (Signed)
NAME:  Wayne Montoya, Wayne Montoya                 ACCOUNT NO.:  [id number]   MEDICAL RECORD NO.:  [id number]          PATIENT TYPE:  EMS   LOCATION:  [facility]                         FACILITY:  [facility]   PHYSICIAN:  Bevelyn Buckles. Bensimhon, MDDATE OF BIRTH:  May 04, 1922   DATE OF CONSULTATION:  DATE OF DISCHARGE:                                 CONSULTATION   PRIMARY CARDIOLOGIST:  Bevelyn Buckles. Bensimhon, MD   PRIMARY CARE PHYSICIAN:  Junious Dresser.   Wayne Montoya presents to [facility] today secondary to mild  volume overload.  I saw Wayne Montoya in the Heart Failure Clinic on February 21, 2008, which time he had some mild volume overload.  I initially  planned on giving him 80 of Lasix in the clinic.  However, nursing staff  was unable to obtain IV access.  So, I ended up discharging him home  with instructions to take 2.5 of metolazone 30 minutes prior to his  Lasix for the next two mornings.  He did that on Friday and Saturday.  On Sunday, I spoke with [name], his son, who is ER nurse here.  [name] was  still concerned he had some volume overload, had not really responded  well to the Zaroxolyn.  I instructed him to go ahead and give him  another dose of metolazone and I would have home health draw blood work  today (Monday).  However, Wayne Montoya daughter-in-law called me this  morning, state Wayne Montoya still was complaining of abdominal distention,  although his weight is down 4 pounds.  I instructed them to go ahead and  bring him to the emergency room to get evaluated.   PAST MEDICAL HISTORY:  1. Congestive heart failure secondary to diastolic dysfunction with a      normal EF.  2. Participant in the CardioMEMS pulmonary artery sensory device      trial.  3. Well-controlled hypertension.  4. Diabetes, currently insulin dependent.  5. Coronary artery disease, status post CABG.  6. History of CVA.  7. Atrial fibrillation requiring chronic anticoagulation therapy.  8. Dyslipidemia.   SOCIAL  HISTORY:  The patient lives in [place] alone.  His son, [name] who  is our Charity fundraiser, is very attentive along with his daughter-in-law.  He does  not use any tobacco, EtOH, drug, or herbal medication use.  Tries to  follow a 2 g heart-healthy diabetic diet.  He receives Meals-on-Wheels  daily.   FAMILY HISTORY:  Noncontributory.   REVIEW OF SYSTEMS:  Positive for edema, abdominal tightness, early  satiety.   ALLERGIES:  PENICILLIN and AVELOX.   CURRENT MEDICATIONS:  1. Vytorin 10/80.  2. Coumadin per Coumadin Clinic.  3. Aspirin 81.  4. Amlodipine and benazepril 10/40 two tablets daily.  5. Lantus insulin per primary care.  6. Flomax 0.4.  7. Multivitamin daily.  8. Carvedilol 12.5 b.i.d.  9. Potassium 20 mEq 3 tablets in the morning and 2 in the evening.  10.Lasix 120 mg b.i.d.  11.Janumet 50/1000 mg b.i.d.   PHYSICAL EXAMINATION:  VITAL SIGNS:  Temperature 97.1, heart rate 67,  respirations  22, blood pressure 96/59, and pulse ox 96% on room air.  GENERAL:  In no acute distress.  HEENT:  Unremarkable.  NECK:  Supple without lymphadenopathy, JVD around 8.  CARDIOVASCULAR:  S1 and S2, irregular rhythm.  LUNGS:  Clear to auscultation bilaterally.  SKIN:  Warm and dry.  ABDOMEN:  Soft, nontender, mildly distended, positive bowel sounds.  EXTREMITIES:  Lower extremities without clubbing or cyanosis.  He has a  trace of edema only.  NEUROLOGIC:  Alert and oriented x3.   Chest x-ray is pending.  EKG; atrial fibrillation at a rate of 67.  Lab  work is pending.   IMPRESSION:  Mild congestive heart failure.  We will give one dose of  intravenous Lasix here, check blood work.  If labs are stable and chest  x-ray is stable, we will plan on discharging the patient home later  today.  Dr. Arvilla Meres has been into examine and assess.  The  patient agrees with plan of care.      [name], ACNP      Bevelyn Buckles. Bensimhon, MD  Electronically Signed    MB/MEDQ  D:   02/25/2008  T:  02/26/2008  Job:  [id number]

## 2010-06-29 NOTE — Assessment & Plan Note (Signed)
[facility]                          CHRONIC HEART FAILURE NOTE   NAME:Wayne Montoya, Wayne Montoya                        MRN:          [id number]  DATE:07/02/2007                            DOB:          [birth year]    PRIMARY CARE PHYSICIAN:  Dr. Wyline Mood.   PRIMARY CARDIOLOGIST:  Dr. Nicholes Mango.   When I last saw Wayne Montoya back in April he was status post left humerus  fracture which is being treated with sling/immobilization.  He was  having a large amount of pain, but states he has been doing much better  since then.  Has  not had any narcotics in two days.  Still sleeping in  the electric recliner because he is unable to get up and down off of his  bed because of the fracture.  Wayne Montoya also has not been able to  transmit his CardioMEMS pulmonary artery sensory numbers since fracture  of his humerus because he is unable to lay down for transmission.  Overall states he is feeling much better.  He decided to move to   to be closer to his son, [name].  Continues to receive Meals on  Wheels one meal a day.  Gradually over the last three months he has lost  17 pounds, but states he has been trying to get rid of some of his belly  fat.  He denies any symptoms suggestive of volume overload.  Denies any  further episodes of dizziness or lightheadedness since I decreased his  hydralazine dose.  His most recent lab work checked on May 8 potassium  was 4.5, BUN, creatinine stable at 29 and 1.7, BMP 329.   PAST MEDICAL HISTORY:  1. Congestive heart failure secondary to diastolic dysfunction with a      normal EF.  2. Coronary artery disease status post CABG.  3. History of hypertension.  4. Dyslipidemia.  5. Atrial fibrillation.  6. History of questionable Bell's palsy versus CVA with residual left      facial droop.  7. Diabetes.  8. Remote history of tobacco use.  9. BPH.  10.History of depression.  11.Chronic carotid bruits status post Doppler  study in 2006 showing      mild plaque in the bulbs and proximal bifurcation left greater than      right.  12.Chronic renal insufficiency with baseline creatinine 1.6.  13.Chronic anticoagulation therapy.  14.Status post recent ABI's show no evidence of segmental lowering      extremity arterial disease at rest bilaterally and normal ABI's per      Dr. Tonny Bollman.   REVIEW OF SYSTEMS:  As stated above; otherwise, negative.   CURRENT MEDICATIONS:  1. Carvedilol 12.5 mg b.i.d.  2. Lasix 80 mg daily.  3. Januvamet 50/1000 mg b.i.d.  4. KCl 20 mEq two tablets b.i.d.  5. Hydralazine 12.5 mg t.i.d.  6. Lantus insulin 10 units subcu q.h.s.  7. Vytorin 10/80 daily.  8. Multivitamin daily.  9. Coumadin per Coumadin Clinic.  10.Aspirin 81 daily.  11.Amlodipine 10/benazepril 40 mg daily.  12.Flomax 0.4 mg daily.  PHYSICAL EXAMINATION:  VITAL SIGNS:  Weight 193 pounds, blood pressure  119/71, heart rate 66.  GENERAL:  Wayne Montoya is in no acute distress.  NECK:  No signs of jugular vein distention at 45 degree angle.  LUNGS:  Clear to auscultation bilaterally.  CARDIOVASCULAR:  Reveals S1, S2, regular rhythm with 2/6 systolic  ejection murmur noted.  ABDOMEN:  Soft, nontender, positive bowel sounds.  EXTREMITIES:  Lower extremities without clubbing, cyanosis or edema at  this time.  NEUROLOGICAL:  Alert and oriented x3.  EXTREMITIES:  The patient with a sling to his left upper extremity.   IMPRESSION:  Congestive heart failure secondary to diastolic dysfunction  without signs of volume overload.  Will continue current medications,  and follow the patient closely.  Once he is able to lie comfortably will  ask him to proceed with transmitting his CardioMEMS numbers again.      [name], ACNP  Electronically Signed      Rollene Rotunda, MD, [facility]  Electronically Signed   MB/MedQ  DD: 07/02/2007  DT: 07/02/2007  Job #: [id number]   cc:   Wyline Mood, M.D.

## 2010-06-29 NOTE — Assessment & Plan Note (Signed)
[facility]                                 ON-CALL NOTE   MINA, CARLISI                        MRN:          [id number]  DATE:03/16/2008                            DOB:          09-03-1922    PRIMARY CARDIOLOGIST:  Bevelyn Buckles. Bensimhon, MD   Received a call from initially Dimas Millin, the daughter-in-law of Josha Weekley, stating that he had taken an extra Lasix yesterday and he was  lightheaded today.  She knew that his weight was 194 this morning, but  had no idea what it was yesterday.  I asked her for the patient's phone  number, which she gave me and then I called Mr. Gruenberg.  Mr. Hagmann  reports that his weight was 200.2 pounds yesterday and because of this,  he took a dose of his Zaroxolyn.  Since doing so, he has been urinating  just about every hour and his weight this morning is down to 194 pounds,  a loss of 6 pounds overnight.  He does feel a little bit unsteady and  dizzy this morning.  He denies any chest pain or dyspnea.  He was not  dyspneic yesterday, nor did he have any significant swelling.  He only  took the Zaroxolyn because his weight was over 200 and that is what he  has been instructed to do.  He already took his Lasix 80 mg this  morning.  I recommended that he hold his evening Lasix dose today and  that he liberally drink fluids today.  I recommended that he change  positions slowly as it is likely that he is dehydrating.  If he has no  improvement symptoms as the day progresses or by morning, that he is to  give [place] a call back.  He does have Home Health that comes out to the  house once a week and I have recommended that they check a BMET when  they come out this week.  The patient will call back with any concerns.      Nicolasa Ducking, ANP  Electronically Signed      Bevelyn Buckles. Bensimhon, MD  Electronically Signed   CB/MedQ  DD: 03/16/2008  DT: 03/16/2008  Job #: [id number]

## 2010-06-29 NOTE — Assessment & Plan Note (Signed)
Wayne Montoya                          CHRONIC HEART FAILURE NOTE   NAME:Wayne Montoya, Wayne Montoya                        MRN:          [id number]  DATE:11/23/2006                            DOB:          1922-08-20    PRIMARY CARE PHYSICIAN:  Dr. Romero Belling.   Wayne Montoya returns today for follow up of his congestive heart failure  which is secondary to ischemic cardiomyopathy with a normalized EF. He  also has a history of coronary artery disease status post CABG,  paroxysmal atrial fibrillation, diabetes, hypertension, hyperlipidemia,  chronic renal insufficiency, and previous stroke. He has only just  recently agreed to anticoagulation therapy and was started on Coumadin  and he is being followed in the Coumadin clinic. Wayne Montoya has also  recently been diagnosed with prostate cancer. He is being followed by  Dr. Patsi Sears and underwent cryotherapy on August 23rd. He has been  complaining of dysuria and hematuria. Hematuria has resolved. He still  has some ongoing dysuria. He also complains of constipation and lower  extremity coolness at night. Otherwise, he states that he is doing okay.  He denies any orthopnea, PND, light headedness, or dizziness.   PAST MEDICAL HISTORY:  As stated above. Also includes chronic vascular  insufficiency of the lower extremities, chronic renal insufficiency with  baseline creatinine of 1.6, depression, carotid bruits, previous carotid  Doppler showing mild plaque in the bulbs and proximal bifurcation, left  greater than right. Right ICA 0% to 39%, left ICA 40% to 59% stenosis.   REVIEW OF SYSTEMS:  As stated above.   CURRENT MEDICATIONS:  1. Flomax 0.4.  2. Amlodipine.  3. Benazepril 10/40.  4. Vytorin 10/80.  5. Humalog insulin.  6. KCL 40.  7. Lasix 80.  8. Humalog sliding scale.  9. Coreg 3.125 b.i.d.  10.Multivitamin daily.  11.Vitamin D daily.  12.Uroxatral 0.25 daily.  13.Plavix 75 daily.   PHYSICAL  EXAMINATION:  VITAL SIGNS:  Weight 205 pounds, blood pressure  120/69, heart rate 63.  GENERAL:  No acute distress.  NECK:  JVD at 8 centimeters at a 45 degree angle.  LUNGS:  Clear to auscultation.  CARDIOVASCULAR:  Reveals an S1 and S2. 2/6 systolic ejection murmur.  ABDOMEN:  Soft and nontender. Positive bowel sounds.  LOWER EXTREMITIES:  Without clubbing or cyanosis. The patient has non-  pitting +1 edema bilateral, positive pedals.  SKIN:  Warm and dry.  NEUROLOGIC:  Alert and oriented x3.   IMPRESSION:  Congestive heart failure without signs of volume overload  at this time. Blood pressure is well controlled. We will continue  current medication. The patient has been given advice regarding his  constipation. He is going to try Colace 1 to 2 tablets p.o. nightly and  then MiraLax p.r.n. and Fleet's enema if needed. If does not resolve,  follow up with primary care physician for further evaluation. We will  see the patient back in 6 to 8 weeks. Sooner if he has any problems.      Wayne Montoya, ACNP  Electronically Signed      Fayrene Fearing  Antoine Poche, MD, [facility]  Electronically Signed   MB/MedQ  DD: 11/23/2006  DT: 11/24/2006  Job #: [id number]

## 2010-06-29 NOTE — Assessment & Plan Note (Signed)
[place] [facility]                            CARDIOLOGY OFFICE NOTE   NAME:Wayne Montoya, Wayne Montoya                        MRN:          [id number]  DATE:04/23/2008                            DOB:          [birth year]    The patient was seen in [facility] on April 23, 2008, for Dr.  Eden Emms.   PRIMARY CARDIOLOGIST:  Bevelyn Buckles. Bensimhon, MD   PRIMARY CARE PHYSICIAN:  Wyline Mood.   This is a pleasant 77 year old white male patient of Dr. [name] with  history of congestive heart failure secondary to diastolic dysfunction.  He is also a participant in the CardioMEMS pulmonary artery sensory  device trial.   The patient is here today with 2-week history of just feeling bad  overall.  He did have a cold and was treated with Zithromax for an upper  respiratory infection.  His home health nurse thought he should be  checked by [place], because he is on Coumadin and to check for volume  overload.  Overall, the patient generally just feels weak and fatigued.  He denies any chest pain, shortness of breath, orthopnea, paroxysmal  nocturnal dyspnea, dizziness, or presyncope.  His son check his weights  daily and they are actually down at home.  He was as high as 193 ten  days ago and was 187 on his home scales today.   CURRENT MEDICATIONS:  1. Vytorin 10/80 mg daily.  2. Coumadin as directed.  3. Aspirin 81 mg daily.  4. Amlodipine.  5. Benazepril 10/40 mg 2 daily.  6. Lantus as directed.  7. Flomax 0.4 mg daily.  8. Multivitamin daily.  9. Carvedilol 12.5 mg b.i.d.  10.Janumet 50/100 mg b.i.d.  11.Potassium 20 mEq 2 b.i.d.  12.Lasix 80 mg in the morning and 40 in the evening.   REVIEW OF SYSTEMS:  Significant for weakness and fatigue, and generally  not feeling well.  All other systems negative.  Please see above  dictation.   PHYSICAL EXAMINATION:  GENERAL:  This is a pleasant 77 year old white  male in no acute distress.  VITAL SIGNS:  Blood pressure  is 90/64; pulse 68; and weight 194, which  is actually down 10 pounds since his visit in February.  NECK:  Without  JVD, HJR, bruit, or thyroid enlargement.  LUNGS:  Decreased breath sounds with fine crackles; otherwise, clear  anterior, posterior, and lateral.  HEART:  Regular rate and rhythm at 68 beats per minute.  Normal S1 and  S2.  No murmur, rub, bruit, thrill, or heave noted.  ABDOMEN:  Soft  without organomegaly, masses, lesions, or abnormal tenderness.  EXTREMITIES:  No cyanosis, clubbing, or edema.  He has good distal  pulses.   IMPRESSION:  1. Weakness and fatigue, question etiology, may be related to recent      upper respiratory infection.  2. Congestive heart failure secondary to diastolic dysfunction with      normal ejection fraction, currently stable and euvolemic.  No      evidence of congestive heart failure.  3. Coronary artery disease status post prior coronary artery  bypass      graft in 1999.  4. Chronic atrial fibrillation.  5. Coumadin therapy.  6. Hypertension.  7. Dyslipidemia.  8. Diabetes mellitus.  9. Chronic renal insufficiency.  10.History of depression.   PLAN:  The patient is euvolemic today.  I do not believe this is  contributing to why he is feeling bad.  We will check a CBC and BMET  today, and he will see back Dr. [name] back in 1-2 months.      Jacolyn Reedy, PA-C  Electronically Signed      Noralyn Pick. Eden Emms, MD, [facility]  Electronically Signed   ML/MedQ  DD: 04/23/2008  DT: 04/24/2008  Job #: [id number]   cc:   Wyline Mood

## 2010-06-29 NOTE — Assessment & Plan Note (Signed)
[place] [facility]                            CARDIOLOGY OFFICE NOTE   NAME:Wayne Montoya, Wayne Montoya                        MRN:          [id number]  DATE:03/08/2007                            DOB:          [birth year]    PRIMARY CARE PHYSICIAN:  Dr. Romero Belling   INTERVAL HISTORY:  Wayne Montoya is a very pleasant 77 year old male with  multiple medical problems including congestive heart failure secondary  to ischemic cardiomyopathy now with a normalized EF.  He also has a  history of coronary artery disease status post bypass surgery, chronic  atrial fibrillation, diabetes, hypertension, heparin, hyperlipidemia,  chronic renal insufficiency and previous stroke.  He has been followed  closely in the Heart Failure Clinic by [name].   I saw him a month ago and he had significant volume overload. We put him  on some p.r.n. metolazone which helped his fluid overload.  However, he  returns today and once again he is having problems with swelling.  He  also has some orthopnea.  He says his breathing is not much different,  but does get short of breath just going up a couple of steps.  Denies  any chest pain.   CURRENT MEDICATIONS:  1. Vytorin 10/80.  2. Humalog insulin.  3. Lasix 80 the morning 40 at night.  4. Potassium 40 in the morning and 20 at night.  5. Coumadin.  6. Aspirin.  7. Amlodipine/benazepril 10/40.  8. Coreg 6.25 b.i.d.  9. Lantus insulin.   PHYSICAL EXAM:  He is an elderly male in no acute distress. He ambulates  around the clinic without respiratory difficulty.  Blood pressure is  138/71, heart rate  64, weight 220 which is a 5 pounds from previous.  HEENT is normal.  Neck is supple.  JVP is about 8-9 cm.  Carotids are 2+ bilaterally  without bruits.  There is no lymphadenopathy or thyromegaly.  CARDIAC:  PMI is nondisplaced. He is mildly irregular, very soft apical  systolic murmur.  Lungs were clear.  ABDOMEN:  Soft, nontender,  nondistended.  No hepatosplenomegaly, no  bruits, no masses.  Good bowel sounds.  EXTREMITIES:  Warm with no clubbing or cyanosis.  There is 2-3+ plus  edema bilaterally.  No rash.  NEURO: Alert and oriented x3.  Cranial nerves II-XII intact.  Moves all  four extremities without difficulty.  Affect is pleasant.   ASSESSMENT/PLAN:  1. Congestive heart failure secondary to diastolic dysfunction.  He      has New York Heart Association class III.  With recurrent volume      overload, will increase his Lasix to 80 b.i.d. and  his potassium      to 40 b.i.d.  We will continue p.r.n. metolazone.  If he does not      diurese, we may need a brief admission for IV diuresis.  Both he      and his son are also interested in the Cardio MEMS pulmonary artery      sensor. We will discuss this in more detail at his next visit.  2.  Hypertension.  Blood pressures suboptimally controlled.  Will      increase his Coreg 9.375 b.i.d.  3. Atrial fibrillation.  This is chronic and stable.  Continue      Coumadin.  4. Coronary artery disease. Status post bypass surgery, stable without      any evidence of ischemia.  Continue current therapy.  5. Hyperlipidemia is followed by Dr. Everardo All with goal LDL is less      than 70.     Bevelyn Buckles. Bensimhon, MD  Electronically Signed    DRB/MedQ  DD: 03/08/2007  DT: 03/08/2007  Job #: [id number]   cc:   Gregary Signs A. Everardo All, MD

## 2010-06-29 NOTE — Assessment & Plan Note (Signed)
[facility]                          CHRONIC HEART FAILURE NOTE   NAME:Wayne Montoya, Wayne Montoya                        MRN:          [id number]  DATE:08/03/2006                            DOB:          February 18, 1922    Wayne Montoya returns today for follow up of congestive heart failure which  is secondary to diastolic dysfunction.  Patient recently had a repeat 2D  echocardiogram, showed his EF still maintained between 55 and 65%.  The  patient has not been seen here in the heart failure clinic since  January.  He has, however, seen Dr. [name], his primary cardiologist,  in followup since that time.  When Dr. [name] saw patient most  recently in May, he adjusted his Lasix to 80 in the morning, 40 in the  evening.  Also increased his potassium, unclear at this time as to what  dose it was changed to, patient states he has been taking 80 in the  morning, 40 in the evening; although, his son, who is accompanying him  today, reports he does not think that is the correct dose that the  patient is actually taking at home.  Will need to clarify.  Patient to  call back, when he gets home, with correct dose.  Wayne Montoya states he  has been doing quite well.  Appetite has been good.  In fact, he has  gained 10 pounds since February.  He is receiving [employer] 1 meal  daily and he states he fudges sometimes and has a couple of sweets  during the week.  He tries to limit those.  However, CBGs fasting are  running 150s in the a.m.  He complains of some edema in ankles.  I asked  him in the past to try and wear TEDS support stockings; however, he  lives alone and is unable to get these on and off on a daily basis and  has not been compliant with the request.  Also, I had asked him to  elevate his legs during the day, he has a recliner at home.  He states  he is up and down so much that he does not put the recliner up.  He is  also pending a prostate biopsy by Dr.  Patsi Sears, his son reports this  is already scheduled for July 2nd, and Dr. Patsi Sears had asked patient  to come off his aspirin and Plavix in preparation of biopsy.  Otherwise,  he denies any orthopnea, PND, no palpitations, lightheadedness,  dizziness, presyncope or syncopal episodes.   PAST MEDICAL HISTORY:  1. Congestive heart failure secondary to diastolic dysfunction with a      normal EF confirmed by a recent echo.  2. Coronary artery disease status post bypass in 1996.  3. Paroxysmal atrial fib with patient refusing anticoagulation      therapy.  4. Diabetes.  5. Hypertension.  6. Hyperlipidemia.  7. Chronic renal insufficiency with baseline creatinine 1.6.  8. History of CVA.  9. Depression.  10.Trace aortic insufficiency and mitral regurgitation by      echocardiogram with no change, confirmed  by recent echo.  11.Carotid bruit status post carotid Doppler in 2006 showing mild      plaque in the bulbs and proximal bifurcations left greater than      right.  Both ICAs take steep posterior dives, right ICA 0-39%, left      ICA 40-59% stenosis.  12.History of BPH.  Pending prostate biopsy by Dr. Patsi Sears.  13.Status post cholecystectomy.   REVIEW OF SYSTEMS:  As stated above.   CURRENT MEDICATIONS:  1. Flomax 0.4 mg daily.  2. Amlodipine.  3. Benazepril 10/40 mg daily.  4. Vytorin 10/80 daily.  5. Humalog insulin as directed.  6. Aspirin 325 daily.  7. Plavix 75 daily.  8. Lasix 80 in the a.m., 40 in the evening.  9. Rocaltrol 0.25 mg daily.  10.Vitamin D.  11.Multivitamin daily.  12.Coreg 3.25 mg b.i.d.  13.Potassium, unclear of dose at this time.   PHYSICAL EXAM:  Weight 215 pounds, blood pressure 143/72 with a heart  rate of 60.  NECK:  Supple, JVD 6-7 cm at 45 degree angle.  CARDIOVASCULAR EXAM:  Irregularly irregulr controlled rate, do not hear  any gallops.  LUNGS:  Clear to auscultation bilaterally.  ABDOMEN:  Obese, nontender, nondistended, positive  bowel sounds.  EXTREMITIES:  Without clubbing or cyanosis.  Patient has 1+ edema,  pitting, in the ankle area.  NEUROLOGICALLY:  Alert and oriented x3.   IMPRESSION:  1. Diastolic heart failure with mild volume overload at this time.  I      am going to increase his Lasix to 80 milligrams twice daily for 3      days then have him resume his 80 in the morning and 40 in the      evening.  For now, I am going to have him take the potassium 40      milliequivalents in the morning and 40 in the evening for the next      3 days and then resume 40 milligrams daily until I repeat blood      work next week.  I am going to see him back next week and check      labs at that time prior to his prostate biopsy.  Will also discuss      with Dr. [name] patient coming off his Plavix and aspirin for      the procedure, from a cardiac perspective.   ADDENDUM:  In reviewing his medical history and pending prostate biopsy,  I would prefer that patient be maintained on a low dose aspirin, at  least 81 mg, prior to his prostate biopsy.  Okay to hold Plavix for the  necessary amount of time prior to his prostate biopsy.      [name], ACNP  Electronically Signed      Bevelyn Buckles. Bensimhon, MD  Electronically Signed   MB/MedQ  DD: 08/03/2006  DT: 08/03/2006  Job #: [id number]   cc:   Lynelle Smoke I. Patsi Sears, M.D.  Sean A. Everardo All, MD

## 2010-06-29 NOTE — Assessment & Plan Note (Signed)
[facility]                          CHRONIC HEART FAILURE NOTE   NAME:Wayne Montoya, Wayne Montoya                        MRN:          [id number]  DATE:08/03/2006                            DOB:          Feb 20, 1922    ADDENDUM   In reviewing his medical history and pending prostate biopsy, I would  prefer that patient be maintained on a low dose aspirin, at least 81 mg,  prior to his prostate biopsy.  Okay to hold Plavix for the necessary  amount of time prior to his prostate biopsy.     [name], ACNP    MB/MedQ  DD: 08/03/2006  DT: 08/03/2006  Job #: [id number]   cc:   Vonzell Schlatter. Patsi Sears, M.D.

## 2010-06-29 NOTE — Discharge Summary (Signed)
NAME:  Wayne Montoya, Wayne Montoya                 ACCOUNT NO.:  [id number]   MEDICAL RECORD NO.:  [id number]          PATIENT TYPE:  INP   LOCATION:  5729                         FACILITY:  [facility]   PHYSICIAN:  Corwin Levins, MD      DATE OF BIRTH:  Jun 06, 1922   DATE OF ADMISSION:  09/01/2006  DATE OF DISCHARGE:  09/01/2006                               DISCHARGE SUMMARY   DISCHARGE DIAGNOSES:  1. Hypoglycemia, secondary to reduced p.o. intake related to getting      medical tests done.  2. History of diastolic congestive heart failure.  3. History of paroxysmal atrial fibrillation.  4. Hypertension.  5. Diabetes mellitus.  6. History of prostate cancer.   CONSULTS:  None.   PROCEDURE:  None.   HISTORY AND PHYSICAL:  See that dictated date of admission, per Dr.  Rito Ehrlich.   HOSPITAL COURSE:  [name] is a very nice 77 year old white male who  was undergoing medical testing, including a CT scan and a bone scan  yesterday, both requiring some decreased p.o. intake.  He took his usual  Humalog insulin of 32, 18 and 25 units with each meal; however, late  last evening, there was decreased blood sugar and he became diaphoretic,  confused with a CBG of 29.  He was treated by EMS and went to the ER  where he was treated with D5 fluid.  He had some mildly persistent low  sugar for the couple of hours he was in the emergency room and felt  because of this and because of his advanced age and comorbidities, it  was prudent to admit to the hospital for observation over night.  Over  night, he has done quite nicely with D5 fluids, CBGs have remained  elevated at 194, 214 and now 198 at noon at the time of discharge.  He  is ambulatory, eating well and has no further complaints, no other  issues and is asking to go home.  It is felt, at this point, he has  gained the maximum benefit from the hospitalization and he asked to be  discharged home.   DISPOSITION:  Discharged home in good condition.  There  are no activity  or dietary restrictions, except for a diabetic diet as before.  He is to  follow up with Dr. Everardo All in 2-4 weeks.   DISCHARGE MEDICATIONS:  Include:  1. Slightly decreased Humalog at 30-16-23 units t.i.d. with meals.  2. Flomax 0.4 mg p.o. daily.  3. Lasix 80 mg p.o. daily.  4. Amlodipine 10 mg p.o. daily.  5. Benazepril 40 mg p.o. daily.  6. K-Dur 40 mEq 1 p.o. daily.  7. Aspirin 325 mg 1 p.o. daily.  8. Vytorin 10/80 one p.o. daily.  9. Calcitriol 0.25 mg 1 injection daily.  10.Plavix 75 mg daily.  11.Multivitamin 1 per day.  12.Coreg 6.25 mg twice daily.      Corwin Levins, MD  Electronically Signed     JWJ/MEDQ  D:  09/01/2006  T:  09/02/2006  Job:  [id number]   cc:   Gregary Signs A.  Loanne Drilling, MD

## 2010-06-29 NOTE — Assessment & Plan Note (Signed)
[facility]                            CARDIOLOGY OFFICE NOTE   MASIYAH, ENGEN                        MRN:          [id number]  DATE:10/18/2006                            DOB:          1922-08-30    PRIMARY CARE PHYSICIAN:  Sean A. Everardo All, MD.   INTERVAL HISTORY:  [name] is a delightful, 77 year old male with  multiple medical problems including congestive heart failure secondary  to an ischemic cardiomyopathy with a normalized EF. He also has a  history of coronary artery disease status post CABG, paroxysmal atrial  fibrillation, diabetes, hypertension, hyperlipidemia, chronic renal  insufficiency and previous stroke. He has been followed closely in the  heart failure clinic by [name], please see her most recent note  of September 19, 2006.   He returns today for routine followup. He says he is feeling very well.  He denies any chest pain or shortness of breath. He is getting around  and able to do all his daily activities without difficulty. He is  complaining a bit about the price of his medications. He has not had any  orthopnea, PND or lower extremity edema.   CURRENT MEDICATIONS:  1. Flomax 0.4 mg a day.  2. Amlodipine/benazepril 10/40 a day. I think he may take 2 tablets a      day, I am not completely sure, we will check.  3. Vytorin 10/80.  4. Humalog insulin.  5. Plavix 75 a day.  6. Lasix 80 in the morning, 40 at night.  7. Vitamin D.  8. Coreg 3.25 b.i.d.  9. Potassium 40 in the morning and 20 at night.   PHYSICAL EXAMINATION:  GENERAL:  He is an elderly male in no acute  distress. He ambulates around the clinic without any respiratory  difficulty.  VITAL SIGNS:  Blood pressure is 118/54 with a heart rate of 63. Weight  is 212.  HEENT:  Normal.  NECK:  Supple, no JVD. Carotids are 2+ bilaterally without any bruits.  There is no lymphadenopathy or thyromegaly.  CARDIAC:  PMI is nondisplaced. He has an irregular  rate and rhythm with  a soft apical systolic murmur. No S3.  LUNGS:  Clear.  ABDOMEN:  Soft, nontender, nondistended. No hepatosplenomegaly, no  bruits, no masses. Good bowel sounds.  EXTREMITIES:  Warm with no cyanosis, clubbing or edema. No rash.  NEUROLOGIC:  Alert and oriented x3. Cranial nerves II-XII are intact.  Moves all 4 extremities without difficulty. Affect is very pleasant.   ASSESSMENT/PLAN:  1. Congestive heart failure. Now with a normalized ejection fraction      he looks quite good. Will continue current therapy. Given his      normalization of is ejection fraction, would likely not benefit      from an ICD.  2. Coronary artery disease. This is stable without any evidence of      ongoing ischemia.  3. Chronic atrial fibrillation. His [place] score is 5 and thus he is a      very high risk for thromboembolic events. He has previously refused  Coumadin; however, I had a long talk with him today and he is      willing to proceed. We will set him up with the Coumadin clinic. He      will need to decrease his aspirin to 81 a day and stop his Plavix.  4. Hypertension well controlled.  5. Hyperlipidemia. This is followed by Dr. Everardo All, goal LDL is less      than 70. Most recent lipids show an LDL of 42 so he is at goal.      Continue current therapy.   DISPOSITION:  He will follow back up in the heart failure clinic. We  will see him back every 3-6 months for routine followup.     Bevelyn Buckles. Bensimhon, MD  Electronically Signed    DRB/MedQ  DD: 10/18/2006  DT: 10/19/2006  Job #: [id number]

## 2010-06-29 NOTE — Assessment & Plan Note (Signed)
[facility]                          CHRONIC HEART FAILURE NOTE   NAME:[name], [name]                        MRN:          [id number]  DATE:12/12/2007                            DOB:          [birth year]    PRIMARY CARDIOLOGIST:  Wayne Buckles. Bensimhon, MD   PRIMARY CARE PHYSICIAN:  Dr. Wyline Mood.   Wayne Montoya returns today for further followup of his congestive heart  failure which is secondary to diastolic dysfunction.  Wayne Montoya states  he has been doing quite well, continues to participate in the CardioMEMS  pulmonary artery sensor research trial.  Wayne Montoya states he has been  doing quite well.  He is now living in [place] in an apartment to be  near his son.  He plays bingo at [facility] every Tuesday morning with  his buddies, states he is pleased with his quality of life at the  current time.  Denies any symptoms suggestive of volume overload, but  his weight is up 3 pounds today.   PAST MEDICAL HISTORY:  1. Congestive heart failure, secondary diastolic dysfunction with a      normal EF confirmed by echocardiogram.  2. Coronary artery disease, status post CABG.  3. Atrial fibrillation.  4. Hypertension.  5. Dyslipidemia.  6. Chronic anticoagulation therapy.   REVIEW OF SYSTEMS:  As stated above.   CURRENT MEDICATIONS:  1. Vytorin 10/80.  2. Coumadin per [facility].  3. Aspirin 81.  4. Amlodipine/benazepril 10/40.  5. Lantus as directed.  6. Flomax 0.4 daily.  7. Multivitamin daily.  8. Carvedilol 12.5 mg b.i.d.  9. KCl 40 mEq b.i.d.  10.Lasix 80 mg b.i.d.  11.Janumet 06/998 mg b.i.d.   PHYSICAL EXAMINATION:  VITAL SIGNS:  Weight 202, weight is up 3 pounds;  blood pressure 116/63, heart rate 63.  GENERAL:  Wayne Montoya is in no acute distress.  NECK:  JVD is around 8 cm at a 45-degree angle.  LUNGS:  Clear except for scattered rhonchi in the right lower lobe that  clears with cough.  CARDIOVASCULAR:  S1 and S2, 2/6  systolic ejection murmur noted.  ABDOMEN:  Soft, nontender, positive bowel sounds, obese.  EXTREMITIES:  Lower extremities without clubbing or cyanosis.  The  patient has +2 pitting edema bilaterally.  NEUROLOGICAL:  Alert and oriented x3.   IMPRESSION:  Congestive heart failure secondary to diastolic dysfunction  with normal ejection fraction with signs of volume overload.  I am going  to increase his Lasix to 120 mg in the morning and continue 80 in the  afternoon for 2 days and see Wayne Montoya back in 2-3 weeks for followup.  We will also check lab work today.  I have reinforced his sodium  restriction with him (he has been eating breakfast at [facility] when he  plays bingo) and his 2000 mL fluid restriction.  His son Wayne Montoya is also  present with him today.      Wayne Montoya, ACNP  Electronically Signed      Wayne Buckles. Bensimhon, MD  Electronically Signed   MB/MedQ  DD: 12/12/2007  DT: 12/12/2007  Job #: [id number]   cc:   Dr. Wyline Mood

## 2010-06-29 NOTE — Assessment & Plan Note (Signed)
[facility]                          CHRONIC HEART FAILURE NOTE   NAME:Wayne Montoya, Wayne Montoya                        MRN:          [id number]  DATE:06/12/2007                            DOB:          11-01-1922    PRIMARY CARDIOLOGIST:  Dr. Nicholes Mango.   PRIMARY CARE PHYSICIAN:  Dr. Wyline Mood in [place].   Wayne Montoya returns today for follow-up of his congestive heart failure  which is secondary to diastolic dysfunction.  Since I last saw Wayne Montoya  in February, he has followed up with Dr. [name] for a routine  cardiology visit.  Unfortunately since that time, he also suffered a  fall with a fractured left humerus that is being followed by I believe  Dr. Jene Every. Wayne Montoya states he had gotten out of his car, was  walking up his front steps, did not have a hand rail, apparently fell  backwards and it so happened someone was walking their dog and saw him.  He is pending follow-up with Dr. Shelle Iron 5.  Wayne Montoya is a participant in  the CardioMEMS study and has been unable to download his daily readings  as he is unable to lay flat to transmit. He denies any symptoms  suggestive of volume overload, in fact states he is the leanest that he  has been in a while. Because of his ongoing left upper extremity pain,  he is unable to sleep in his bed. He states he has been sleeping in a  chair that lifts and that is how he has been resting.  He complains of  still having a lot of pain in his left upper extremity, but in talking  with Wayne Montoya it sounds like he has had increased dizziness. This  correlates with the increased dose of hydralazine, but  he did not make  me aware of this at the time.   PAST MEDICAL HISTORY:  1. Congestive heart failure secondary to diastolic dysfunction with a      normal EF.  2. Coronary artery disease status post CABG.  3. Hypertension.  4. Dyslipidemia.  5. Atrial fib.  6. History of questionable Bell's palsy versus  CVA with residual left      facial droop.  7. Diabetes.  8. Status post appendectomy and cholecystectomy.  9. Remote history of tobacco use.  10.BPH.  11.History of depression.  12.Chronic carotid bruits status post Doppler study in 2006 showing      mild plaque in the bulbs and proximal bifurcation, left greater      than right.  13.Chronic renal insufficiency with a baseline creatinine of 1.6.  14.Vascular insufficiency in the lower extremities.  15.Chronic anticoagulation therapy.  16.Status post recent ABIs showing no evidence of segmental lower      extremity arterial disease at rest bilaterally and normal ABIs per      Dr. Tonny Montoya.   REVIEW OF SYSTEMS:  As stated above.   CURRENT MEDICATIONS:  1. Carvedilol 12.5 mg b.i.d.  2. Lasix 80 mg in the morning and 40 in the evenings.  3. Klor-Con  20 mEq 2 tablets in the morning and one in the evening.  4. Hydralazine 25 t.i.d.  5. Metformin 1000 mg b.i.d.  6. Lantus 10 units subcu q.h.s.  7. Januvia 100 mg daily.  8. Vytorin 10/80 daily.  9. Rocaltrol daily.  10.Multivitamin daily.  11.Coumadin as directed.  12.Aspirin 81 daily.  13.Amlodipine/benazepril 10/40 mg daily.  14.Flomax 0.4 mg daily.   PHYSICAL EXAM:  Weight 201 pounds, weight is actually down 9 pounds,  blood pressure 116/60, heart rate 88, respirations 16, temperature 98.  Wayne Montoya is in no acute distress.  He has a sling to his left upper  extremity. No signs of jugular vein distention.  CARDIOVASCULAR:  S1 and S2, irregular rhythm with a 2/6 systolic  ejection murmur noted.  LUNGS:  Clear to auscultation bilaterally.  ABDOMEN:  Soft, nontender, positive bowel sounds.  LOWER EXTREMITIES:  Without clubbing, cyanosis or edema.  NEUROLOGIC:  Alert and oriented x3   IMPRESSION:  Congestive heart failure without any signs of volume  overload at this time. In fact, I suspect that Wayne Montoya is on the dry  side. I am going to decrease his Lasix to just 80  mg daily, increase his  potassium to 40 mEq b.i.d. until I check blood work next week as his  last potassium was 3.5 and the patient was complaining of leg cramps.  I  also decreased his hydralazine to 12.5 mg t.i.d. as the patient has had  symptoms of dizziness but has not been  orthostatic per son [name]'s report.  Will plan on checking blood work and  adjust medications as needed.      [name], ACNP  Electronically Signed      Rollene Rotunda, MD, [facility]  Electronically Signed   MB/MedQ  DD: 06/12/2007  DT: 06/12/2007  Job #: [id number]   cc:   Wyline Mood, MD  Geralynn Ochs, RN  Jene Every, M.D.

## 2010-06-29 NOTE — Op Note (Signed)
NAME:  Wayne Montoya, Wayne Montoya                 ACCOUNT NO.:  [id number]   MEDICAL RECORD NO.:  [id number]          PATIENT TYPE:  AMB   LOCATION:  [facility]                         FACILITY:  [facility]   PHYSICIAN:  Sigmund I. Patsi Sears, M.D.DATE OF BIRTH:  January 09, 1923   DATE OF PROCEDURE:  10/09/2006  DATE OF DISCHARGE:  10/10/2006                               OPERATIVE REPORT   PREOPERATIVE DIAGNOSIS:  Adenocarcinoma of the prostate, T1c.   POSTOPERATIVE DIAGNOSIS:  Adenocarcinoma of the prostate, T1c.   OPERATIONS:  1. Cystourethroscopy.  2. Suprapubic catheterization.  3. Cryotherapy of the prostate.   SURGEON:  Sigmund I. Patsi Sears, M.D.   ANESTHESIA:  General LMA.   PREPARATION:  Appropriate preanesthesia, the patient was brought to the  operating room and placed on the operating table in the dorsal supine  position, where general LMA anesthesia was introduced.  He was then  replaced in the dorsal lithotomy position, where the pubis was prepped  with Betadine solution and draped in the usual fashion.   REVIEW OF HISTORY:  This 77 year old male, has a PSA of 7.41, a prostate  volume of 53.91 mL, with prostate adenocarcinoma found, Gleason 7, in  the left lateral, left central, right central, and right lateral  biopsies.  He has an AUA symptom score sheet of 7/7, while on Flomax;  with a past history of diabetes, congestive heart failure, TIA, elevated  cholesterol, hypertension, and coronary bypass.  The patient has  selected cryotherapy for treatment for his locally aggressive prostate  cancer.   PROCEDURE:  Cryotherapy needles are placed within the prostate under  ultrasound guidance.  The patient then underwent 2 freeze/thaw cycles  using ice rods within the prostate, and following double freeze/thaw  cycles, the probes were removed.  It is noted that the patient had both  an external sphincter device as well as a Denonvilliers' fascia  temperature probe in place.   At the beginning  of the procedure, cystoscopy was accomplished and a  suprapubic tube placed Adriana Simas), and no bleeding was noted.  The patient  tolerated the procedure well and was awakened and taken to Recovery in  good condition.   NOTE:  The patient had both a B&O suppository, as well as 15 mg of  Toradol at the end of the procedure.      Sigmund I. Patsi Sears, M.D.  Electronically Signed     SIT/MEDQ  D:  10/09/2006  T:  10/10/2006  Job:  [id number]

## 2010-06-29 NOTE — Assessment & Plan Note (Signed)
Wayne Montoya                            CARDIOLOGY OFFICE NOTE   Wayne Montoya, Wayne Montoya                        MRN:          [id number]  DATE:07/07/2006                            DOB:          08/31/1922    PRIMARY CARE PHYSICIAN:  Wayne Montoya, M.D.   INTERVAL HISTORY:  Wayne Montoya is a delightful 77 year old male with a  history of coronary artery disease, status post bypass surgery.  He also  has a history of diastolic heart failure, diabetes, chronic atrial  fibrillation, and chronic renal insufficiency.  He returns today for  routine followup.  Overall, he says he is doing fairly well, but he does  note that he has had increase in his lower extremity edema as well as  some mild orthopnea.  He has been compliant with Montoya of his medications.  He has had an increase in his exertional dyspnea.  He denies any chest  pain.   CURRENT MEDICATIONS:  1. Flomax 0.4 a day.  2. Amlodipine/benazepril 10/40.  3. Metoprolol 50 a day.  4. Vytorin 10/80.  5. Humalog.  6. Lantus.  7. Potassium 40 a day.  8. Aspirin 325 a day.  9. Plavix 75 a day.  10.Lasix 80 a day.  11.Vitamin D.  12.Multivitamin.   PHYSICAL EXAMINATION:  GENERAL:  He is an elderly male.  Ambulates  around the clinic slowly without any respiratory difficulty.  VITAL SIGNS:  Blood pressure is 156/82.  Heart rate is 64.  Weight is  215, which is up 10 pounds from February.  HEENT:  Normal except for a left lid lag.  NECK:  Supple.  JVP is about 8 cm of H2O.  Carotids are 2+ bilaterally  without bruits.  There is no lymphadenopathy or thyromegaly.  CARDIAC:  PMI is nondisplaced.  He is irregularly irregular with no  obvious murmurs, rubs or gallops.  LUNGS:  Clear with mildly decreased breath sounds throughout.  ABDOMEN:  Obese, nontender, nondistended.  No hepatosplenomegaly.  No  bruits.  No masses.  Good bowel sounds.  EXTREMITIES:  Warm with no clubbing or cyanosis.  There is 2+  edema  bilaterally.  NEURO:  Alert and oriented x3.  Cranial nerves II-XII are grossly intact  except for the lid lag.  He moves Montoya four extremities without  difficulty.  Affect is pleasant.   EKG shows atrial fibrillation with previous anterior and inferior  infarct.  Heart rate is 64.  Minimal T wave flattening.  No significant  change from previous.   ASSESSMENT/PLAN:  1. Acute-on-chronic diastolic heart failure.  He does give evidence of      fluid overload today.  We will increase his Lasix to 80 in the      morning and 40 mg at night.  We are also increasing his potassium.      We will get a BMET in one week and see him back in the heart      failure clinic in two weeks.  2. Coronary artery disease, status post bypass grafting in 1996.  He  is quite stable without any evidence of ischemia.  Continue current      therapy.  3. Hypertension:  Blood pressure is elevated today.  Will switch his      metoprolol over to Coreg 0.25 b.i.d. and titrate that as needed.  4. Atrial fibrillation:  This now appears chronic.  He has been very      resistant to Coumadin and remains so.  We will continue his full-      dose aspirin.  5. Hyperlipidemia:  He is followed by Dr. Everardo Montoya.   DISPOSITION:  Will see him back in the heart failure clinic in two weeks  and then follow up with me in several months.  Given that his last  echocardiogram was in 2005, I do think it is reasonable to consider  repeat echocardiogram at his next visit to the heart failure clinic.     Wayne Buckles. Bensimhon, MD  Electronically Signed    DRB/MedQ  DD: 07/07/2006  DT: 07/07/2006  Job #: [id number]   cc:   Wayne Signs A. Everardo All, MD

## 2010-06-29 NOTE — H&P (Signed)
NAME:  Wayne Montoya, Wayne Montoya                 ACCOUNT NO.:  [id number]   MEDICAL RECORD NO.:  [id number]          PATIENT TYPE:  INP   LOCATION:  0104                         FACILITY:  [facility]   PHYSICIAN:  Hollice Espy, M.D.DATE OF BIRTH:  August 06, 1922   DATE OF ADMISSION:  08/31/2006  DATE OF DISCHARGE:                              HISTORY & PHYSICAL   PRIMARY CARE PHYSICIAN:  Sean A. Everardo All, M.D.   CHIEF COMPLAINT:  Low blood sugar.   HISTORY OF PRESENT ILLNESS:  The patient is an 77 year old white male  with past medical history of CHF with diastolic dysfunction and  paroxysmal atrial fibrillation, diabetes and recently diagnosed prostate  cancer who was undergoing a nuclear bone scan and CT scan to look for  evidence of metastatic prostate disease when he started having problems  with diaphoresis and weakness and evaluation of his blood sugars found  that he was quite low with blood sugars in the 30s.  The patient was  brought into the emergency room.  He was given apple juice and a  sandwich as well as a subsequent blood sugar check.  The problem was  that despite his blood sugar going back up to 131, it started going back  down to 61 and then to 64.  The patient was put on a dextrose drip. He  currently is feeling much better.  He denies any headaches.  He says his  vision is blurry today acutely which is improved once he started eating  some.  He was complaining of lightheadedness which is now better.  He  denies any chest pain or palpitations, no shortness of breath or  wheezing or coughing.  He does feel some nausea as well as clammy and  diaphoretic.  He denies any shortness of breath, wheezing or coughing.  No abdominal pain, no hematuria or dysuria.  But he does have problems  with urinary retention from his prostate.  No focal neurologic changes,  numbness, weakness or pain nor any increased lower extremity swelling.  His review of systems is otherwise negative.   The  patient had a basic blood work drawn while in the emergency room  which noted normal albumin of 4, slightly increased BUN of 30,  creatinine 1.4 and normal white count, but with minimal shift  differential.  With the finding of the continuous hypoglycemic event  even after receiving fluid and dextrose it was felt best the patient  come in for 24-hour observation.  Currently the patient is feeling  better.   He also notes that in the last few days he has had problems with some  pain behind his left knee as if he felt something pop.  He had a  previous episode of low blood sugar four days ago where his blood sugar  was found to be 60.  He ate something and it still came down further,  requiring more food intervention.  He did not see a doctor at that time  for that.   PAST MEDICAL HISTORY:  1. Diabetes mellitus.  His A1C was checked one month ago and  was found      to be 7.8.  2. He also has history of congestive heart failure with diastolic      dysfunction and normal EF.  3. CAD, status post CABG in 1998.  4. Paroxysmal atrial fibrillation.  5. Diabetes.  6. Hypertension.  7. Hyperlipidemia.  8. Chronic vascular insufficiency.  9. Chronic renal insufficiency.  Baseline creatinine __________ .  10.CVA.  11.Depression.  12.Trace aortic insufficiency.  13.Mitral regurgitation.  14.Carotid bruits with left ICA stenosis, 40-69%.  15.History of BPH.  16.Status post cholecystectomy.   MEDICATIONS:  1. Regular insulin, NovoLog, three times a day before meals, 32 in the      morning, lunch 18, 25 in the evening.  He does not take any long-      term continuous insulin.  2. Flomax 0.4 p.o. daily.  3. Amlodipine/benazepril 10/40 p.o. daily.  4. Lasix 80 mg p.o. daily.  5. K-Dur 40 mEq p.o. daily.  6. Aspirin 325 mg p.o. daily.  7. Vytorin 10/80 p.o. daily.  8. Plavix 75 mg p.o. daily.  9. Multivitamins p.o. daily.  10.Coreg 3.25 p.o. b.i.d.  11.Rocaltrol 0.25 mcg p.o. daily.    ALLERGIES:  PENICILLIN.   SOCIAL HISTORY:  Denies any tobacco, alcohol or drug use.   FAMILY HISTORY:  Noncontributory.   PHYSICAL EXAMINATION:  VITAL SIGNS:  On admission, temperature 96.8,  heart rate 61, blood pressure 165/75, respirations 18, oxygen saturation  95% on room air.  GENERAL:  The patient is alert and oriented x3, in no apparent distress.  HEENT:  Normocephalic, atraumatic.  Mucous membranes are slightly dry.  He has no carotid bruits.  HEART:  Regular rate and rhythm.  S1 and S2.  Grade 2/6 systolic  ejection murmur.  LUNGS:  Clear to auscultation bilaterally.  ABDOMEN:  Soft, obese, nontender.  Positive bowel sounds.  EXTREMITIES:  No clubbing, cyanosis.  2+ pitting edema from the mid calf  down.   LABORATORY DATA:  Sodium 139, potassium 3.6, chloride 104, bicarb 26,  BUN 30, creatinine 1.4, glucose 99.  White count 10.1, hemoglobin 14.1,  hematocrit 42.6, MCV 80, platelet count 152 with 78% neutrophils.  Liver  function tests are normal including an alk phos which is 49 and albumin  4.   ASSESSMENT/PLAN:  1. Hypoglycemia.  The immediate cause of the patient's hypoglycemia is      not readily apparent.  He spent most of the day today getting scans      done including nuclear medicine and CT scan.  Possible he had      decreased p.o. intake  acutely today and continued his medicines.      On the other hand he may be having a process which may be      prolonging his insulin in his system suggesting some mild renal      insufficiency but nothing severe.  No signs of infection jump out      at this time.  Would plan to nevertheless attempt to check a      urinalysis to rule out infection which may be dropping the      patient's blood sugars.  Hold his insulin, Humalog t.i.d.  Put him      on gentle sliding scale and also check CBGs q.3h. the first 24      hours, treating only the hypoglycemic events.  2. Renal insufficiency.  Close to baseline.  Continue his  medications.  3. Coronary artery disease, status post  CABG with history of      paroxysmal atrial fibrillation and congestive heart failure with      diastolic dysfunction.  We will continue all the patient's      diuretic, Coreg, and ACE inhibitor.  4. Complaints of left knee pain, possibly concerning for a ruptured      Baker's cyst versus a possible DVT.  Especially with the patient's      history of prostate cancer need to consider coagulopathy.  Go ahead      and check a lower extremity Doppler of that leg to rule out DVT.  5. History of prostate carcinoma.  Will try to follow up on his films      to get results of these to insure that he is not having __________      malignancy.      Hollice Espy, M.D.  Electronically Signed     SKK/MEDQ  D:  09/01/2006  T:  09/01/2006  Job:  [id number]   cc:   Gregary Signs A. Everardo All, MD  [address]  [place]  [place] [address]

## 2010-06-29 NOTE — Assessment & Plan Note (Signed)
[facility]                            CARDIOLOGY OFFICE NOTE   Montoya, Wayne                        MRN:          [id number]  DATE:12/21/2007                            DOB:          10-Jul-1922    PRIMARY CARE PHYSICIAN:  Dr. Wyline Mood.   REASON FOR PRESENTATION:  Evaluate the patient with diastolic heart  failure and progressive lower extremity edema.   HISTORY OF PRESENT ILLNESS:  The patient last was seen in our Heart  Failure Clinic on December 12, 2007.  At that time, he was having some  increased lower extremity edema.  Because of this, he did have his 80 mg  twice a day Lasix changed to 120 mg in the morning and 80 in the  afternoon for a few days; however, he has continued to have increased  lower extremity swelling.  He has had a slow weight gain of about 5  pounds in the last several days.  He is feeling a little more abdominal  discomfort.  He is not having any new shortness of breath and denies any  PND or orthopnea.  He has not been having any palpitation, presyncope,  or syncope.  He tried to keep his feet elevated.  He does use some salt  as he wants to have a little flavor.  He denies any chest pressure,  neck, or arm discomfort.  He transmits his CardioMEMS religiously.  His  mean pulmonary artery pressure actually however is in the low to mid 30s  and has demonstrated a consistent trend.   PAST MEDICAL HISTORY:  Congestive heart failure, well-preserved ejection  fraction, coronary artery disease with CABG in 1999 (I have searched the  entire chart, but cannot find the description other than one mention of  the sixth vessel bypass), hypertension, dyslipidemia, atrial  fibrillation, questionable Bell palsy versus CVA, diabetes, remote  tobacco use, benign prostatic hypertrophy, history of depression, mild  carotid stenosis, chronic renal insufficiency.   ALLERGIES AND INTOLERANCES:  AVELOX.   MEDICATIONS:  1. Vytorin  10/80 daily.  2. Coumadin.  3. Aspirin 81 mg daily.  4. Amlodipine and benazepril 10/40 daily.  5. Lantus.  6. Flomax.  7. Multivitamin.  8. Carvedilol 12.5 mg b.i.d.  9. Potassium 40 mEq b.i.d.  10.Lasix 80 mg b.i.d.  11.Janumet 50/100 b.i.d.   REVIEW OF SYSTEMS:  As stated in the HPI and otherwise negative for all  other systems.   PHYSICAL EXAMINATION:  GENERAL:  The patient is in no distress.  VITAL SIGNS:  Blood pressure 132/73, heart rate 64 and regular, weight  206 pounds, body mass index 28.  HEENT:  Eyes unremarkable.  Pupils equal, round, and reactive to light.  Fundi not visualized.  Oral mucosa unremarkable.  NECK:  No jugular distention at 40 degrees.  Carotid upstroke brisk and  symmetrical.  No bruits, thyromegaly.  LYMPHATICS:  No cervical, axillary, or inguinal adenopathy.  LUNGS:  Clear to auscultation with decreased breath sounds bilaterally.  BACK:  No costovertebral angle tenderness.  CHEST:  Well-healed sternotomy scar.  HEART:  PMI  not displaced or sustained.  S1 and S2 within normal limits.  No S3, no S4, 2/6 apical systolic murmur radiating slightly at the  aortic outflow tract.  No diastolic murmurs.  ABDOMEN:  Obese, positive bowel sounds.  Normal in frequency and pitch.  No bruits, rebound, guarding, or midline pulsatile mass.  No  hepatomegaly.  No splenomegaly.  SKIN:  No rashes.  No nodules.  EXTREMITIES:  2+ pulses, moderate bilateral lower extremity edema to the  knees, chronic venous stasis changes.  NEUROLOGIC:  Oriented to person, place, and time.  Cranial nerves II-XII  grossly intact.  Motor grossly intact.   EKG; atrial fibrillation, rate 60s, axis within normal limits, probable  old inferior infarct and anterior infarct, no acute ST-T wave changes.   ASSESSMENT AND PLAN:  1. Heart failure with a well-preserved ejection fraction.  His volume      is up.  His CardioMEMS is unchanged.  His lower extremity swelling      is increased.   His weight is increased.  Given all of this, I am      going to check BMET today.  I am going to increase his Lasix back      to 120 mg in the morning and 80 mg in the afternoon.  He then has a      scheduled appointment next week in the Heart Failure Clinic and      Marcelino Duster can check another BMET and decide on further diuretic      changes.  He does keep his feet elevated.  He could probably do      better on salt.  He will remain on the other medicines as listed.  2. Defibrillation.  Remains on chronic Coumadin therapy.  3. Coronary artery disease.  He has no ongoing ischemia.  No further      cardiovascular testing is suggested.  4. Dyslipidemia.  Per his primary care physician.  5. Followup again as above.     Rollene Rotunda, MD, [facility]  Electronically Signed    JH/MedQ  DD: 12/21/2007  DT: 12/22/2007  Job #: [id number]   cc:   Wyline Mood

## 2010-06-29 NOTE — Assessment & Plan Note (Signed)
[facility]                          CHRONIC HEART FAILURE NOTE   NAME:[name], [name]                        MRN:          [id number]  DATE:08/08/2006                            DOB:          May 31, 1922    Wayne Montoya returns today for followup of his congestive heart failure,  which is secondary to diastolic dysfunction.  I just recently saw [name] on June 19th, at which time I found him to be in mild volume  overload.  I increased his Lasix to 80 mg b.i.d. for three days, then  had him resume his 80 mg in the a.m. and 40 in the evening after  completion.  He states that he did take the medication, as prescribed.  Felt like he had helped temporarily but then when he went back to the 80  and 40, he experienced the peripheral edema again.  His weight at this  time is only down 1 pound from last week.  I also reviewed the patient's  2D echocardiogram, which was done June 18th.  The patient's ejection  fraction was found to be 55-60% with no change from previous echo.  The  patient is aware of results.   Wayne Montoya is also pending a prostate biopsy next week by Dr. Patsi Sears.  Other than the peripheral edema, Wayne Montoya denies any symptoms of  orthopnea, PND, presyncope or syncopal episodes, palpitations,  lightheadedness, or dizziness.  I had also asked him in the past to wear  support stockings, but he states that he cannot put these on or get them  off by himself, as he lives alone.  I have also asked him to elevate his  legs in his recliner.  He states that he does do this but is rather  sporadic.   At the last visit, Wayne Montoya also stated that he was holding his Plavix  and aspirin for pending prostate biopsy.  I asked him to continue his  aspirin secondary to his history of atrial fibrillation and previous  CVA; however, today, Wayne Montoya states he is holding both the Plavix and  aspirin, per Dr. Imelda Pillow request.   PAST MEDICAL HISTORY:  1. Congestive heart failure secondary to diastolic dysfunction with a      normal EF confirmed by recent echo.  2. Coronary artery disease, status post CABG in 1996.  3. Paroxysmal atrial fibrillation with patient refusing      anticoagulation therapy.  4. Diabetes.  5. Hypertension.  6. Hyperlipidemia.  7. Chronic vascular insufficiency in the lower extremities.  8. Chronic renal insufficiency with baseline creatinine of 1.6.  9. CVA.  10.Depression.  11.Trace aortic insufficiency and mitral regurgitation by      echocardiogram.  12.Carotid bruits, status post carotid Doppler in 2006 showing mild      plaque in the bulbs and proximal bifurcations, left greater than      right.  Right ICA 0-39%, left ICA 40-59% stenosis.  13.History of BPH.  14.Status post cholecystectomy.   REVIEW OF SYSTEMS:  As stated above.   CURRENT MEDICATIONS:  Include Flomax,  amlodipine, Benazepril 10/40 mg  daily, Vytorin 10/80 daily, Humalog as directed, potassium 40 mg, Coreg  3.25 mg p.o. b.i.d., Rocatriol 0.25 mg daily, Lasix 80 in the morning  and 40 in the evening, Plavix 75, which is on hold, aspirin 325, which  the patient has also put on hold.   PHYSICAL EXAMINATION:  VITAL SIGNS:  Weight 214.  Blood pressure 120/76  with a pulse of 60.  GENERAL:  [name] is in no acute distress.  NECK:  No jugular venous distention at 45 degree angle.  LUNGS:  Clear to auscultation bilaterally.  CARDIOVASCULAR:  Irregularly irregular, controlled rate.  ABDOMEN:  Obese.  Soft, nontender.  Positive bowel sounds.  EXTREMITIES:  Lower extremities without clubbing or cyanosis.  Patient  has nonpitting 1+ edema, bilateral ankles.  NEUROLOGIC:  He is alert and oriented x3.   IMPRESSION:  Patient with diastolic heart failure with nonpitting lower  extremity edema at this time with patient inability to physically place  and remove TED stockings; therefore, not wearing support stockings as  requested.  Also  noncompliant with keeping legs elevated during the day.  No real response to increased diuretic dose.  I question whether or not  this is an acute fluid volume overload.  Will check BNP at this time.  I  have given the patient a prescription for Zaroxylan 2.5 mg to only take  if I call him requesting that he begin the p.r.n. diuretic for three  days, otherwise continue his Lasix at current dose.  Will also check  BMET while obtaining labs.  Have patient follow up in three weeks.  Continue previous medications.      [name], ACNP  Electronically Signed      Rollene Rotunda, MD, [facility]  Electronically Signed   MB/MedQ  DD: 08/08/2006  DT: 08/08/2006  Job #: [id number]

## 2010-06-29 NOTE — Cardiovascular Report (Signed)
NAME:  Wayne Montoya, Wayne Montoya                 ACCOUNT NO.:  [id number]   MEDICAL RECORD NO.:  [id number]          PATIENT TYPE:  OIB   LOCATION:  6532                         FACILITY:  [facility]   PHYSICIAN:  Bevelyn Buckles. Bensimhon, MDDATE OF BIRTH:  [birth year]   DATE OF PROCEDURE:  03/27/2007  DATE OF DISCHARGE:  03/28/2007                            CARDIAC CATHETERIZATION   PATIENT IDENTIFICATION:  Wayne Montoya is a delightful 77 year old male with  a history of congestive heart failure secondary to an ischemic  cardiomyopathy.  His ejection fraction has normalized.  He has had  chronic class III heart failure symptoms, and was recently hospitalized  for IV diuresis.  After a lengthy discussion with both him and his son,  we decided to enroll him in the Cardiomens Pulmonary Artery Sensor Trial  (champion).  He presents today for sensor implant.   PROCEDURES PERFORMED:  1. Right heart cath.  2. Cardiomens pulmonary artery sensor implant.   DESCRIPTION OF THE PROCEDURE:  The risks and indications of the  procedure were explained, consent was signed, and placed on the chart.   Initially a 7-French venous sheath was placed in the right femoral vein  using a modified Seldinger technique.  We then exchanged the 7-French  sheath out for the 11-French sheath.  The sheath initially inserted very  easily; however, as the last few centimeters of the sheath were being  advanced, we ran into a little bit of resistance.  We took a cine and  saw that the tip of the catheter was advanced into branch off the IVC.  The catheter was pulled back, slightly, and we were able to get a Wholey  wire directed up into the IVC.  We then inserted a JR-4 catheter over  the [facility] wire, and were able to direct the sheath into the IVC without  any difficulty.   A standard right heart cath was performed with a Swan-Ganz catheter;  however, we are unable to advance the Swan into the pulmonary artery.  We switched out for an  NIKE which easily guided [place] into the left  pulmonary artery.  We took several angiograms both in the AP and lateral  projections.  Once an adequate pulmonary artery branch was identified.  We advanced the wire into the branch and exchanged out the Swan catheter  for the Cardiomens delivery device.  Under fluoroscopic guidance, we  placed the pulmonary artery sensor, and in the medial branch of the left  pulmonary artery there was good sensor position.  Once adequate readings  were obtained, all equipment was removed and a final cine of the sensor  was taken which showed excellent positioning.  There are no apparent  complications.   HEMODYNAMIC RESULTS:  Right atrial pressure with a mean of 10, RV  pressure 45/4.  PA pressure 40/20 with a mean of 29.  Pulmonary  capillary wedge was a mean of 18 with V waves ranging between 25 and 30  mmHg. Fick cardiac output was 5.8 liters per minute.  Cardiac index was  2.7 liters per minute per meter squared.  Pulmonary  vascular resistance  was 1.9 Woods units.   ASSESSMENT:  1. Mildly elevated filling pressures as above.  2. Successful Cardiomens pulmonary artery sensor implant.   DISPOSITION:  He will be enrolled in either to the monitoring arm, or  control arm later today.  He will be observed overnight, and receive  device teaching most likely in the morning; and then he will be suitable  for discharge.      Bevelyn Buckles. Bensimhon, MD  Electronically Signed     DRB/MEDQ  D:  03/27/2007  T:  03/29/2007  Job:  [id number]

## 2010-06-29 NOTE — Discharge Summary (Signed)
NAME:  Wayne Montoya, Wayne Montoya                 ACCOUNT NO.:  [id number]   MEDICAL RECORD NO.:  [id number]          PATIENT TYPE:  INP   LOCATION:  4734                         FACILITY:  [facility]   PHYSICIAN:  Bevelyn Buckles. Bensimhon, MDDATE OF BIRTH:  [birth year]   DATE OF ADMISSION:  03/20/2007  DATE OF DISCHARGE:  03/23/2007                         DISCHARGE SUMMARY - REFERRING   DISCHARGE DIAGNOSES:  1. Acute on chronic diastolic heart failure improved with IV diuresis,      thrombocytopenia, antibody-positive chronic atrial fibrillation.  2. Known coronary disease, status post bypass surgery.  3. Renal insufficiency.  His BUN and creatinine during this admission      was 25 and 1.58.  History as previously.   BRIEF HISTORY:  [name] is an 77 year old male who was seen in the  office on January 22nd by Dr. [name].  He was then treated  with  outpatient management for his volume overload.  On returning to the  office on January 22, he continued to have problems with orthopnea and  swelling and continued short of breath with minimal exertion.  Thus, Dr.  [name] felt that he should be admitted for further treatment.   PAST MEDICAL HISTORY:  Also notable for:  1. Hypertension.  2. Chronic atrial fibrillation, being maintained on Coumadin.  3. Coronary artery disease with bypass surgery.  4. Hyperlipidemia.   LABORATORY:  Chest X-Ray:  On the February 3, revealed stable  cardiomegaly with mild central vascular congestion and no convincing  edema.  Stable patchy air space in the right base medially.  On February  3, admission hemoglobin and hematocrit was 13.8, 41.3, normal indices.  Platelets 129.  WBC 8.4.  At the time of discharge, hemoglobin 13.6,  hematocrit 40.8.  Normal indices.  Platelets were clumped.  WBCs 6.9.  Admission PT was 15.8, INR 1.2, PTT 31.  Sodium on admission was 141,  potassium 3.8, BUN 25, creatinine 1.58, glucose 146.  Normal LFTs except  for his alkaline phosphatase  was slightly low at 38.  At the time of  discharge sodium was 137 and potassium 4.1, BUN 20, creatinine 1.42,  glucose 138.  BNP on admission was 176.  Heparin antibody screen was  positive.  It is noted that time of this dictation a HIT panel was sent  off to [facility] on March 21, 2006.   HOSPITAL COURSE:  Dr. [name] admitted Mr. Jachim to [facility] for additional treatment.  [employer] screened the  patient for a CardioMEMS study.  Echocardiogram was performed that  revealed an ejection fraction of 55% to the data revealed an ejection  fraction of 55% without wall motion abnormalities, trivial AI and mild  MAC, left atrial enlargement, mild decreased RV systolic dysfunction,  and mild right atrial enlargement.  He was placed on IV heparin and by  the fourth Dr. [name] felt that his volume status had much improved.  He changed his IV Lasix back to p.o.  Dr. [name] was not sure why the  patient's platelets were decreased.  A HIT panel was ordered.  Dr.  [name]  felt CardioMEMS could be performed the following afternoon if  platelets were okay.  However, on the 5th, platelets still showed  clumping and labs were repeated.  Hit panel was sent to [facility].  His  CardioMEMS procedure was postponed.  Dr. [name] noted that the  patient will not be bridged with anticoagulation.  He has been off  Coumadin for long periods of time in the past without embolic problems.  Anticipation of outpatient CardioMEMS on Tuesday if his HIT panel  returned from [facility] as negative.  By the 6there, Dr. [name] felt that  the patient could be discharged home.   DISPOSITION:  The patient is discharged home after being asked to  maintain low-sodium, heart-healthy, ADA diet.  Wound care and activities  are not applicable.   DISCHARGE MEDICATIONS:  1. Lasix 80 mg in the morning, 40 mg in the p.m.  2. Coumadin 2.5 mg daily.  3. Coreg was increased to 9.375 mg b.i.d.  4. Potassium 40 mEq  daily, 20 mEq in the evening.  5. Vytorin 10/80 at bedtime.  6. Rocaltrol 0.25 mcg daily.  7. Multivitamin daily.  8. Aspirin 81 mg daily.  9. Norvasc/benazepril 10/40 was tablets daily.  10.Flomax 0.5 daily.  11.Lantus 10 units at bedtime.  12.Humalog sliding scale as previously   An outpatient CardioMEMS has been set up for Tuesday March 27, 2007.  Specific time for him to report to outpatient short-stay is pending at  the time of this dictation.  He is asked to weight daily.  Bring all  weights and all medications to appointments.  Dr. [name] will review  on Monday the patient's HIT panel from [facility] and if this remains  negative, he will proceed with CardioMEMS placement.  However, if this  is positive, the procedure will be cancelled.  The patient was asked to  report to short-stay at 6:30 a.m. on Tuesday morning unless otherwise  instructed by the office.   Discharge time:  55 minutes.      Joellyn Rued, PA-C      Bevelyn Buckles. Bensimhon, MD  Electronically Signed    EW/MEDQ  D:  03/23/2007  T:  03/23/2007  Job:  [id number]   cc:   Gregary Signs A. Everardo All, MD

## 2010-06-29 NOTE — Assessment & Plan Note (Signed)
[facility]                          CHRONIC HEART FAILURE NOTE   NAME:Wayne Montoya, Detter                        MRN:          [id number]  DATE:09/19/2006                            DOB:          Jul 23, 1922    PRIMARY CARE PHYSICIAN:  Sean A. Everardo All, MD.   PRIMARY CARDIOLOGIST:  Bevelyn Buckles. Bensimhon, MD.   Mr. Wedig returns today for follow-up of his congestive heart failure  which is secondary to ischemic cardiomyopathy.  Since I last saw Mr.  Lawrance in June, he has undergone a prostate biopsy and has been told that  he has prostate cancer.  He has been followed by Dr. Lynelle Smoke I.  Patsi Sears and is pending possible cryotherapy on October 07, 2006.  States he will be coming off of his Plavix for seven days prior to his  procedure and apparently has been cleared by Dr. [name].  Mr. Deckard  is somewhat apprehensive about this, otherwise he states he is doing  okay.  He has been taking his medications without problems.  He does  complain of some insomnia but he relates this to worrying about the  prostate cancer.  He does complain of chronic edema in  his left lower  leg but he states this has not gotten any worse or changed.  He  continues to receive meals on wheels assistance.  He states his son  bought him a diabetic cookbook and he has been trying to cook making  adjustments in his diet for this.  He did have one episode recently  where his hypoglycemic.  Apparently he was at a friend's house, became  very diaphoretic and EMS was notified.  Patient was found to have a  glucose of 29.  He received, he states, several IV doses of medication  and was in the hospital and discharged back home the following day.  He  states he is doing the Humalog sliding scale insulin and he adjusts the  dose now based on how many carbohydrates he eats.   PAST MEDICAL HISTORY:  1. Congestive heart failure secondary to ischemic cardiomyopathy with      a now normal EF  confirmed by recent echo.  2. Coronary artery disease status post CABG in 1996.  3. Paroxysmal atrial fibrillation with patient refusing      anticoagulation therapy.  4. Diabetes.  5. Hypertension.  6. Hyperlipidemia.  7. Chronic vascular insufficiency in the lower extremities.  8. New diagnosis of prostate cancer pending cryotherapy.  9. Chronic renal insufficiency with baseline creatinine 1.6.  10.CVA.  11.Depression.  12.Trace aortic insufficiency and mitral regurgitation by echo.  13.Chronic bruits, status post carotid Doppler in 2006 showing mild      plaque in the bulbs and proximal bifurcation, left greater than      right.  Right ICA 0-39%, left ICA 40-59% stenosis.  14.History of BPH.  15.Status post cholecystectomy.   REVIEW OF SYSTEMS:  As stated above.   CURRENT MEDICATIONS:  1. Flomax 0.4.  2. Amlodipine/benazepril 10/40 daily.  3. Vytorin 10/80 daily.  4. Humalog insulin.  Patient states  he takes 32 in the morning, 18 at      noon and 25 in the evening.  5. Potassium 40 mEq in the morning and 20 in the evening.  6. Coreg 3.125 b.i.d.  7. Multivitamin and vitamin D daily.  8. Rocaltrol 0.25 mg daily.  9. Lasix 80 in the morning and 40 in the evening.  10.Plavix and aspirin are currently on hold.   PHYSICAL EXAMINATION:  VITAL SIGNS:  Blood pressure 142/78 manually in  the right arm.  Weight today is 214 pounds.  GENERAL APPEARANCE:  No acute distress.  NECK:  No JVD.  LUNGS:  Clear to auscultation.  CARDIOVASCULAR:  S1 and S2, irregular regular with controlled rate of  62.  ABDOMEN:  Soft and nontender with positive bowel sounds.  EXTREMITIES:  Patient has a trace of lower extremity edema with left  being greater than the right.  NEUROLOGIC:  Alert and oriented x3.   IMPRESSION:  Stable heart failure at this time.  Mild volume status.  Will continue current medications, Lasix at 80 in the morning and 40 in  the evening. Patient stable to proceed with  cryotherapy from a heart  failure perspective.   I will see patient back after his prostate procedure, sooner if he has  any problems.      [name], ACNP  Electronically Signed      Bevelyn Buckles. Bensimhon, MD  Electronically Signed   MB/MedQ  DD: 09/19/2006  DT: 09/20/2006  Job #: [id number]   cc:   Lynelle Smoke I. Patsi Sears, M.D.

## 2010-06-29 NOTE — Assessment & Plan Note (Signed)
[facility]                            CARDIOLOGY OFFICE NOTE   CLEMENT, DENEAULT                        MRN:          [id number]  DATE:05/10/2007                            DOB:          August 09, 1922    PRIMARY CARE PHYSICIAN:  Dr. Wyline Mood.   Mr. Nyman is a very pleasant 77 year old male with a history of  congestive heart failure secondary to diastolic dysfunction.  He also  has a history of coronary artery disease status post bypass surgery,  chronic atrial fibrillation, diabetes and hypertension.  He returns  today with his son for routine followup.   Mr. Fiumara has a CardioMEMS pulmonary artery sensor in place and he has  been followed closely by [name] in the heart failure clinic,  with titration of his diuretics.  Most recently his Lasix was an  increased 80 mg b.i.d. about 2 days ago.  Overall he is feeling very  well.  He notes that his edema has been going down since the adjustment  of his diuretics.  He denies any chest pain.  Does have some chronic  shortness of breath.  He is not having any other problems.   CURRENT MEDICATIONS:  Lantus insulin, metformin 500 b.i.d., Januvia,  Vytorin 10/80, multivitamin, Coumadin, aspirin 81, Lotrel 10/40, Flomax  0.4, potassium 40 b.i.d., hydralazine 12.5 t.i.d., carvedilol 12.5  b.i.d. and Lasix 80 b.i.d.   EXAM:  He is an elderly male in no acute distress.  Ambulates around the  clinic slowly without any respiratory difficulty.  Blood pressure is 124/60 with a heart rate of 58.  His weight is 210,  which is down 2 pounds from 2 weeks ago.  HEENT:  Normal except for a left-sided facial droop which is thought to  be secondary to Bell's palsy.  NECK:  Supple.  No significant JVD appreciated.  Carotids are 2+  bilaterally without bruits.  There is no lymphadenopathy or thyromegaly.  CARDIAC:  PMI is nondisplaced.  He is irregular with a soft apical  systolic murmur.  No S3.  LUNGS:   Clear.  ABDOMEN:  Obese, nontender, nondistended.  No hepatosplenomegaly, no  bruits, no masses appreciated.  EXTREMITIES:  Warm with no cyanosis or clubbing.  There is 1+ edema  bilaterally.  SKIN:  No rash.  NEURO:  Alert and x3.  Cranial nerves II-XII intact.  Moves all four  extremities without difficulty.  Affect is pleasant.   ASSESSMENT/PLAN:  1. Congestive heart failure.  His volume looks better today.  He still      has some mild volume on board but Lasix has recently been adjusted.      We will continue to adjust as needed.  Follow in the heart failure      clinic.  2. Hypertension, well controlled.  3. Coronary artery disease.  Doing well.  No evidence of ischemia.  4. Atrial fibrillation.  This is chronic.  He is tolerating it well.      He is on Coumadin.  Continue current therapy.  His rate looks good.  5. Hyperlipidemia.  He is due for repeat lipids.  Goal LDL is less      than 70.  Titrate his Vytorin as needed.     Bevelyn Buckles. Bensimhon, MD  Electronically Signed    DRB/MedQ  DD: 05/10/2007  DT: 05/11/2007  Job #: [id number]   cc:   Wyline Mood, MD

## 2010-06-29 NOTE — Assessment & Plan Note (Signed)
[facility]                          CHRONIC HEART FAILURE NOTE   NAME:Wayne Montoya, Wayne Montoya                        MRN:          [id number]  DATE:10/18/2007                            DOB:          11-06-1922    PRIMARY CARDIOLOGIST:  Bevelyn Buckles. Bensimhon, MD   PRIMARY CARE PHYSICIAN:  Dr. Wyline Mood   Wayne Montoya returns today for further followup of his congestive heart  failure, which is secondary to diastolic dysfunction.  Wayne Montoya has  been doing well, since I last saw him back in August.  He is status post  implant of the CardioMEMS pulmonary artery sensor device.  He has done  quite well.  I have been monitoring his numbers daily, when he  transmits.  His diastolic pressure today is 23 per CardioMEMS, which is  acceptable readings for him, as this appears to be his baseline.  Previously, when his CardioMEMS pulmonary artery sensor pressures had  been running higher, I would start him on low-dose hydralazine, however,  he did not tolerate this medicine very well and ultimately after further  discussion with Wayne Montoya and his son, Wayne Montoya, who is an Char[employer] fundraiser.  We decided to  stop the hydralazine.  Wayne Montoya lightheadedness and dizziness has  resolved.  Otherwise, no change in his status.  However, I did have to  reiterate sodium restriction as he has been eating bacon and sausage for  breakfast more recently and has some mild lower extremity edema today.   PAST MEDICAL HISTORY:  1. Congestive heart failure, secondary to diastolic dysfunction with a      normal EF confirmed by echocardiogram.  2. Coronary artery disease, status post CABG  3. Hypertension.  4. Atrial fib.  5. Dyslipidemia.  6. Chronic anticoagulation therapy.  7. History of questionable Bell palsy versus CVA with residual left      facial droop.  8. Diabetes.  9. History of depression.  10.BPH.  11.Chronic carotid bruits, status post Doppler study in 2006 showing      mild plaque in  the both sub-proximal bifurcation, left greater than      right.  12.Chronic renal insufficiency with baseline creatinine 1.6.  13.Status post ABIs showing no evidence of segmental lower extremity      arterial disease at rest, bilaterally a normal ABIs per Dr. Excell Seltzer.   REVIEW OF SYSTEMS:  As stated above.   CURRENT MEDICATIONS:  1. Vytorin 10/80 mg daily.  2. Coumadin as directed.  3. Aspirin 81.  4. Amlodipine/benazepril 10/40.  5. Lantus as directed.  6. Flomax 0.4 daily.  7. Multivitamin daily.  8. Carvedilol 12.5 mg b.i.d.  9. Kay Ciel 40 mEq b.i.d.  10.Lasix 80 mg b.i.d.  11.Janumet 500/100 mg b.i.d.   PHYSICAL EXAMINATION:  VITAL SIGNS:  Weight is 198 pounds, blood  pressure 108/67 with a heart rate of 84.  GENERAL:  Wayne Montoya is in no acute distress  NECK:  No signs of jugular vein distention at 45 degree angle.  LUNGS:  Clear to auscultation bilaterally.  ABDOMEN:  Soft and nontender.  Positive  bowel sounds  LOWER EXTREMITIES:  With +1 pitting edema with the left being greater  than the right.  NEUROLOGIC:  Alert and oriented x3.   IMPRESSION:  Congestive heart failure, secondary to diastolic  dysfunction with signs of mild volume overload at this day with recent  dietary indiscretion.  We will plan on increasing Lasix to 120 mg in the  morning 80 in the evening and increase his Kay Ciel to 60 mEq in the  morning and 40 mEq in the evening, note, the patient has 20 mEq tablets.  He is requesting a 90-day prescriptions.  We will take care of this for  him to the [facility] in [place].      [name], ACNP  Electronically Signed      Rollene Rotunda, MD, [facility]  Electronically Signed   MB/MedQ  DD: 10/18/2007  DT: 10/19/2007  Job #: [id number]   cc:   Dr. Wyline Mood

## 2010-06-29 NOTE — Assessment & Plan Note (Signed)
[facility]                            CARDIOLOGY OFFICE NOTE   Wayne Montoya, Wayne Montoya                        MRN:          [id number]  DATE:07/11/2007                            DOB:          [birth year]    PRIMARY CARDIOLOGIST:  Bevelyn Buckles. Bensimhon, MD   PRIMARY CARE Harbert Fitterer:  Dr. Junious Dresser.   PATIENT PROFILE:  [age]-year-old Caucasian male with prior  history of chronic diastolic heart failure as well as CAD CABG, who  presents for followup.   PROBLEM LIST:  1. Coronary artery disease.      a.     Status post CABG.  2. Chronic diastolic congestive heart failure with normal LV function.  3. Hypertension.  4. Hyperlipidemia.  5. Atrial fibrillation on chronic Coumadin.  6. Type 2 diabetes mellitus.  7. Remote tobacco abuse.  8. Benign prostatic hypertrophy.  9. Depression.  10.Bilateral carotid bruits with ultrasound in 2006 showing mild      plaque in the bulb with proximal bifurcation, left greater than      right.  11.Chronic renal insufficiency.  Baseline creatinine 1.6.  12.Questionable history of Bell's palsy versus CVA with residual left      facial droop.  13.History of normal ABIs.   HISTORY OF PRESENT ILLNESS:  [age]-year-old Caucasian male with  the above problem list.  He was last seen in Heart Failure Clinic May  18. 2009.  Patient is recently recovering form a left humerus fracture,  which has prevented him from lying flat for some time now, although in  the past two days he has been able to lie flat without any arm  discomfort.  From a heart failure standpoint, he said his weight has  been stable.  Denies any significant dyspnea on exertion, PND,  orthopnea, dizziness, syncope or edema.  He has not had any chest  discomfort.   ALLERGIES:  PENICILLIN.   HOME MEDICATIONS:  1. Carvedilol12.5 mg b.i.d.  2. Lasix 80 mg daily.  3. Janumet 50/1000 mg b.i.d.  4. Potassium chloride 20 mEq 2 tablets b.i.d.  5. Hydralazine 12.5 mg t.i.d.  6. Lantus 10 units nightly.  7. Vytorin 10/80 mg daily.  8. Multivitamin daily.  9. Coumadin as directed.  10.Aspirin 81 mg daily.  11.Amlodipine/benazepril 10/40 mg daily.  12.Flomax 0.4 mg daily.   PHYSICAL EXAMINATION:  Blood pressure is 115/60, heart rate 62,  respirations 18.  Weight is 196 pounds, up 3 pounds since the last  visit.  Pleasant, white male in no acute distress.  Awake, alert and  oriented x3.  HEENT:  Normal.  NEURO:  Grossly intact.  SKIN:  Warm and dry without lesions or masses.  NECK:  Soft bilateral carotid bruits.  No JVD.  LUNGS:  Respiration is regular and unlabored.  Clear to auscultation.  CARDIAC:  Regular, S1, S2.  2/6 systolic murmur along the left sternal  border.  ABDOMEN:  Obese, soft, nontender, nondistended.  Bowel sounds present  __________.  EXTREMITIES:  Warm, dry, pink.  No clubbing, cyanosis or edema.  Dorsalis pedis, posterior tibial pulses 1+  and equal bilaterally.   ACCESSORY CLINICAL FINDINGS:  None.   ASSESSMENT/PLAN:  1. Coronary artery disease.  Patient offers no complaints.  He has      been doing relatively well without chest discomfort or significant      dyspnea.  He remains on aspirin, beta blocker and statin therapy.  2. Chronic diastolic congestive heart failure.  His blood pressure is      well controlled and he appears euvolemic.  He is up three pounds      from his last visit.  However, notably he was down eight pounds at      that time, and I suspect that maybe he is equilibrating.  He      remains on beta blocker, ACE inhibitor, calcium channel blocker as      well as hydralazine therapy.  3. Atrial fibrillation.  Rate controlled with beta blocker and      Coumadin.  4. Hyperlipidemia.  Continue statin.  5. History of leg cramping.  His potassium was increased at the end of      April and when last lab checked May 8, showed potassium of 4.5.  He      has not had any additional leg  cramping.   DISPOSITION:  Patient is now able to lie flat and plans to start  transmitting via CardioNet again next week.  He will follow up with Dr.  [name] in approximately two months or sooner if necessary.      Nicolasa Ducking, ANP  Electronically Signed      Bevelyn Buckles. Bensimhon, MD  Electronically Signed   CB/MedQ  DD: 07/11/2007  DT: 07/11/2007  Job #: [id number]

## 2010-07-02 NOTE — Discharge Summary (Signed)
NAME:  Wayne Montoya, Wayne Montoya               ACCOUNT NO.:  [id number]   MEDICAL RECORD NO.:  [id number]          PATIENT TYPE:  INP   LOCATION:  1424                         FACILITY:  [facility]   PHYSICIAN:  Rosalyn Gess. Norins, MD  DATE OF BIRTH:  September 08, 1922   DATE OF ADMISSION:  03/09/2006  DATE OF DISCHARGE:                               DISCHARGE SUMMARY   ADMITTING DIAGNOSES:  1. Right lower lobe pneumonia.  2. Respiratory insufficiency.  3. Uncontrolled diabetes.  4. Coronary artery disease.  5. Benign prostatic hypertrophy.  6. Dyslipidemia.   DISCHARGE DIAGNOSES:  1. Right upper lobe pneumonia, stable and able to be managed at home.  2. Acute hypoxemia, resolved.  3. Diabetes, stable.  4. Coronary artery disease, stable.  5. Benign prostatic hypertrophy, stable.  6. Dyslipidemia, stable.   CONSULTANTS:  None.   PROCEDURES:  1. Chest x-ray March 09, 2006, with air space process right upper      lung.  2. Chest x-ray March 10, 2006, with improving right upper lobe      airspace disease but worsening airspace disease in right middle      lung.  3. Chest x-ray March 12, 2006, with no significant change.  4. Chest x-ray March 13, 2006, with patchy bilateral airspace      disease that was stable.   HISTORY OF THE PRESENT ILLNESS:  The patient is an 77 year old gentleman  who presented complaining of weakness, increased facial droop on the  left secondary to maybe Bell's palsy, confused, erratic driving per his  neighbors.  He had a cough for 2 days prior to admission productive of a  white sputum.  He has had an abnormal gait.  The patient has had no  chest pain except with coughing.  He had no nausea or vomiting.  No sick  contacts.  The patient did have chest tightness, chills and fatigue, and  headache.  Admitting evaluation significant for temperature to 103.1,  chest x-ray with right upper lobe airspace disease consistent with  pneumonia versus edema.  White count was  normal at 6200.  Because of his  fever and hypoxemia the patient was admitted to hospital.   Please see Dr. Diamantina Monks note for his past medical history, family  history and social history.  This is remarkable for history of bypass  surgery in 1996, history of insulin-dependent diabetes, BPH,  hypertension, history of paroxysmal atrial fibrillation, dyslipidemia,  and CHF.   MEDICATIONS AT ADMISSION:  1. Norvasc/benazepril 5/20.  2. Flomax 0.4 mg b.i.d.  3. Lasix 80 mg daily.  4. Insulin regular 18/8/12.  5. Lantus at bedtime.  6. Toprol-XL 50 mg daily.  7. Potassium 20 mEq daily.  8. Vytorin 10/80 q.h.s.   HOSPITAL COURSE:  #1 - PNEUMONIA.  The patient was a started on IV  antibiotics.  Azithromycin was added on day #2.  The patient's white  count remained stable.  By January 28 the patient was doing well enough  to be switched to p.o. Avelox and Vantin, was weaned off of oxygen.  On  this regimen he remained stable, afebrile.  His chest x-ray stabilized  with no worsening airspace disease.  With the patient having no  respiratory distress, no change in x-ray, being on oral antibiotics, he  is felt to be felt to be stable to complete a home regimen of oral  antibiotics.   #2 - ATRIAL FIBRILLATION.  The patient appeared to be rate-controlled  with heart rate of 65 and was asymptomatic.   #3 - DIABETES.  The patient was tracked with sliding scale.  CBGs  continued be somewhat elevated.  Hemoglobin A1c March 10, 2006, was  7.9%.  The patient is followed on a regular basis by Dr. Romero Belling..  Plan:  The patient will be discharged home and continue on Lantus and  before-meal insulin. He will follow up with Dr. Romero Belling.   #4 - MILD ANEMIA.  The patient appeared to be stable.   #5 - BENIGN PROSTATIC HYPERTROPHY.  The patient was to be seen by Dr.  Patsi Sears and have a prostate biopsy.  This will need to be rescheduled  after discharge.   #6 - FAILURE TO THRIVE.  The  patient at this point lives alone at home  and this is his preference.  He will need to have home health followup.  He will also need to have home health OT and PT to continue to improve  his mobility and strength.   DISCHARGE EXAMINATION:  VITAL SIGNS:  Temperature 99.1, blood pressure  135/81, heart rate 65, respirations 20, O2 saturations 91% on room air.  CBG was 180.  GENERAL APPEARANCE:  A somewhat scruffy elderly gentleman in no acute  distress.  HEENT:  Normocephalic, atraumatic.  CHEST:  The patient is moving air well.  No rales, wheezes or rhonchi  were appreciated.  There is no increased work of breathing, no accessory  muscles of respiration in use.  CARDIOVASCULAR:  Shows 2+ radial pulses.  Precordium was quiet.  His  heart sounds were distant but regular.  He had a regular rate and rhythm  without murmurs.  ABDOMEN:  Protuberant, soft, no guarding or rebound.  He had positive  bowel sounds in all four quadrants.  EXTREMITIES:  Without edema.   DISCHARGE MEDICATIONS:  1. The patient will resume Lotrel as at home.  2. Continue Flomax 0.4 mg b.i.d.  3. Lasix 80 mg daily.  4. Metoprolol XL 50 mg daily.  5. Simvastatin 10/80 one q.h.s.  6. Potassium 20 mEq daily.  7. He will continue on NovoLog as at home 01/21/17.  8. He will be continued on Lantus at 10 units q.h.s.  9. Tussionex for cough.  10.Protonix for GI protection.  11.Xopenex HFA two puffs t.i.d.  12.Vantin 400 mg b.i.d. for an additional 5 days.   The patient has completed his azithromycin.   DISPOSITION:  The patient is discharged home.  He will be scheduled a  followup with Dr. Romero Belling in approximately 10-14 days.  Home health  will be arranged.   CONDITION AT TIME OF DISCHARGE:  Stable but guarded given his advanced  age, living alone, home situation, and multiple medical problems.      Rosalyn Gess Norins, MD  Electronically Signed    MEN/MEDQ  D:  03/15/2006  T:  03/15/2006  Job:   [id number]

## 2010-07-02 NOTE — Assessment & Plan Note (Signed)
[facility]                          CHRONIC HEART FAILURE NOTE   NAME:SCOTTOsmany, Wayne                     MRN:          [id number]  DATE:01/13/2006                            DOB:          [birth year]    PRIMARY CARE PHYSICIAN:  Corwin Levins, M.D.   PRIMARY CARDIOLOGIST:  Bevelyn Buckles. Bensimhon, M.D.   Wayne Montoya returns today for further evaluation and medication titration  of his congestive heart failure which is secondary to diastolic  dysfunction.  Echocardiogram in 2005, showed a normal EF with mild left  ventricular hypertrophy.  Wayne Montoya states he has been doing well. His  only complaint is some lower peripheral edema.  He states he was  recently started on Actos and he states the swelling started with the  initiation of the Actos, and he is not pleased with this.  He denies any  orthopnea or PND.  No shortness of breath.  Mildly dyspneic on exertion.  No fever or chills, lightheadedness, or dizziness.   PAST MEDICAL HISTORY:  1. Congestive heart failure secondary to diastolic dysfunction with a      normal EF.  2. Coronary artery disease, status post coronary artery bypass graft      in 1996.  3. Paroxysmal atrial fibrillation with the patient refusing      anticoagulation therapy.  4. Diabetes.  5. Hypertension.  6. Hyperlipidemia.  7. Chronic renal insufficiency with a baseline creatinine of 1.6.  8. History of cerebrovascular accident with a left sided facial droop.  9. Depression.  10.Trace aortic insufficiency and mitral regurgitation by      echocardiogram.  11.Carotid bruits status post carotids in 2006, showed mild hard      plaque in the bulbs and proximal bifurcations, left greater than      right.  Both ICAs take steep posterior dives.  Right ICA 0-39%      stenosis; left ICA 40-59% stenosis.  12.History of BPH.  13.Status post cholecystectomy.   REVIEW OF SYSTEMS:  As stated above, otherwise negative.   CURRENT  MEDICATIONS:  1. Vytorin 10/80 mg daily.  2. Aspirin 325.  3. Flomax b.i.d.  4. Lotrel 5/20.  5. Glipizide 10 mg daily.  6. Plavix on hold.  7. Toprol XL 50 daily.  8. Metformin ER 500 mg, two tablets daily.  9. Celexa 20 mg daily.  10.Actos 45 mg daily.  11.Lasix 40 mg b.i.d.  12.K-Dur 40 mEq b.i.d.   PHYSICAL EXAMINATION:  VITAL SIGNS:  Weight 219 pounds, blood pressure  155/74 with a pulse of 71.  GENERAL:  Wayne Montoya is in no acute distress today, very pleasant,  cooperative gentleman.  NECK:  Jugular venous distention 8-to-10-cm at a 45 degree angle.  LUNGS:  Clear to auscultation bilaterally.  CARDIOVASCULAR:  Reveals an S1 and S2 irregularly today.  ABDOMEN:  Soft, nontender.  Positive bowel sounds.  EXTREMITIES:  Lower extremities:  The patient has 1-2+ pitting edema  bilaterally.   IMPRESSION:  Mild volume overload.   At this time, we are going to:  1. Increase his Lasix to 60  mg twice a day.  2. Check blood work.  3. See the patient back in a few weeks.  4. Will continue other medications.  5. I will plan on checking a 12-lead on the patient at the next visit      as he has been irregular the last two visits.      [name], ACNP  Electronically Signed      Rollene Rotunda, MD, [facility]  Electronically Signed   MB/MedQ  DD: 01/13/2006  DT: 01/14/2006  Job #: [id number]

## 2010-07-02 NOTE — Assessment & Plan Note (Signed)
[name]                            CARDIOLOGY OFFICE NOTE   [name], [name]                        MRN:          [id number]  DATE:04/03/2006                            DOB:          12/13/1922    PRIMARY CARE PHYSICIAN:  Dr. Gregary Signs A. Everardo All.   HISTORY OF PRESENT ILLNESS:  [name] is a delightful 77 year old male  with a history of coronary artery disease and diastolic heart failure.  He also has paroxysmal atrial fibrillation, diabetes, chronic renal  insufficiency, and previous stroke.  Returns today for routine followup.  Since we last saw him he was hospitalized with right lower lobe  pneumonia but was treated and has done well.  He returns today for  routine followup.  He is doing quite well.  He denies any chest pain or  shortness of breath.  His weight has been stable between 193 and 195.  He has been very compliant with his medications.  His only complaint is  that he feels bored at home at times.   CURRENT MEDICATIONS:  1. Flomax 0.4 a day.  2. Lotrel 5/20 a day.  3. Metoprolol 50 a day.  4. Vytorin 10/80 a day.  5. Insulin.  6. Potassium 40 a day.  7. Aspirin 325 a day.  8. Plavix 75 a day.  9. Lasix 80 a day.   PHYSICAL EXAMINATION:  He is well appearing, in no acute distress,  ambulates around the clinic slowly without any respiratory distress.  Blood pressure 152/80, heart rate is 60, weight is 205 which is stable.  HEENT:  Sclerae anicteric, EOMI, seems to be a little bit of a left lid  lag.  This is stable.  NECK:  Supple, there is no JVD, carotids are 2+ bilaterally without any  audible bruits.  There is no lymphadenopathy or thyromegaly.  CARDIAC:  Regular rate and rhythm with no rub.  There is S4 as well as a  2/6 systolic ejection murmur at the left sternal border.  LUNGS:  Clear.  ABDOMEN:  Obese, nontender, nondistended, no hepatosplenomegaly, no  bruits, no masses, good bowel sounds.  EXTREMITIES:  Warm with  trace edema, no cyanosis, clubbing.  NEURO:  Alert and oriented x3, cranial nerves II-XII grossly intact,  moves all 4 extremities without difficulty.  Affect is pleasant.   ASSESSMENT/PLAN:  1. Coronary artery disease.  This is quite stable without any evidence      of ongoing ischemia, continue current therapy, we will check his      lipids on Friday.  2. Hypertension.  This is poorly controlled.  We will double his      Lotrel to 10/40 and check a BMET on Friday.  Suspect we will need      to change his metoprolol over to Coreg.  3. Congestive heart failure secondary to diastolic dysfunction.      Volume status is well controlled, continue current therapy.  4. Atrial fibrillation.  Appears to be in sinus rhythm today.  We had      a long talk about his need  for Coumadin given his high risk of      stroke based on his previous stroke, diabetes, and multiple risk      factors.  However, he would like to avoid warfarin at this point      and discuss it at the next visit.   DISPOSITION:  We will see him back in clinic in 4 months for a routine  followup.     Bevelyn Buckles. Bensimhon, MD  Electronically Signed    DRB/MedQ  DD: 04/03/2006  DT: 04/03/2006  Job #: [id number]   cc:   Gregary Signs A. Everardo All, MD

## 2010-07-02 NOTE — Consult Note (Signed)
[facility]                          ENDOCRINOLOGY CONSULTATION   Wayne Montoya, Wayne Montoya                        MRN:          [id number]  DATE:01/18/2006                            DOB:          25-Nov-1922    REFERRING PHYSICIAN:  Rollene Rotunda, MD, [facility]   REASON FOR REFERRAL:  Diabetes.   HISTORY OF PRESENT ILLNESS:  An 77 year old man who was diagnosed with  type 2 diabetes in 1991. His only known complication is his coronary  disease. He currently takes Actos, metformin and glipizide and his most  recent hemoglobin A1c was 7.0. The patient states his glucose at home is  well controlled. Symptomatically, he has 2 months of moderate edema of  both legs but no associated numbness.   PAST MEDICAL HISTORY:  1. CABG 1996.  2. CHF.  3. Hypertension.  4. Dyslipidemia.  5. Prostatism.   SOCIAL HISTORY:  He was widowed in 2004. He is retired, he lives alone.   FAMILY HISTORY:  Positive for diabetes in his mother.   REVIEW OF SYSTEMS:  He has lost 10 pounds over the past few months, he  states due to his efforts. He denies hypoglycemia.   PHYSICAL EXAMINATION:  VITAL SIGNS:  Blood pressure 140/75, heart rate  71, temperature is 98.4, the weight is 216.  GENERAL:  No distress.  SKIN:  Normal texture, temperature, not diaphoretic.  HEENT:  No proptosis, no periorbital swelling. Pharynx is normal,  NECK:  No goiter.  CHEST:  Clear to auscultation. No respiratory distress. Do not hear any  rales.  CARDIOVASCULAR:  No JVD. There is 2+ bilateral pretibial edema. Regular  rate and rhythm. No murmur. Pedal pulses are intact.  LOWER EXTREMITIES:  He has surgical scars, presumably from his 1996  CABG. He has onychomycosis of many of his toenails. No ulcer present on  the feet. The feet are of normal color and temperature.  NEUROLOGIC:  Alert, well oriented, he does not appear anxious nor  depressed. Sensation is intact to touch in the feet.    IMPRESSION:  1. Type 2 diabetes with control that is good, but should be better      still.  2. Congestive heart failure.  3. Edema.  4. Other chronic medical problems as noted above.   PLAN:  1. We discussed the risks of diabetes and importance of diet and      exercise therapy.  2. I have told him that because of his edema, he is going to need to      discontinue the Actos and because of his CHF he will need to      discontinue the metformin.  3. I have told him that these discontinuation will leave him requiring      insulin. He is very hesitant to take insulin. I told him that      ultimately, the type, timing and dosage of insulin is based on his      home glucose record based on the heterogeneity of type 2 diabetes.      However, I have told him that the vast  majority of people need      mealtime insulin. He is being referred to diabetes educator today      and he will start with Humalog 5 units t.i.d. (q.a.c.). At the time      of this, he will go off his oral agents and I presume he will need      significant upward titration of his insulin.  4. Return here in a few weeks.     Sean A. Everardo All, MD  Electronically Signed    SAE/MedQ  DD: 01/18/2006  DT: 01/18/2006  Job #: [id number]   cc:   Rollene Rotunda, MD, [facility]  Cristy Folks, CDE

## 2010-07-02 NOTE — Assessment & Plan Note (Signed)
[facility]                          CHRONIC HEART FAILURE NOTE   NAME:Wayne Montoya, Wayne Montoya                        MRN:          [id number]  DATE:02/24/2006                            DOB:          Sep 27, 1922    PRIMARY CARDIOLOGIST:  Bevelyn Buckles. Bensimhon, M.D.   PRIMARY CARE:  Corwin Levins, M.D.   Wayne Montoya returns today for followup regarding his congestive heart  failure which is secondary to diastolic dysfunction.  Echocardiogram in  2005 showed a normal EF with mild left ventricular hypertrophy.  [name] states he has been doing well.  He has a history of paroxysmal  atrial fibrillation with refusal for anticoagulation therapy.  Today,  pulse noted to be irregular.  A 12-lead EKG confirms atrial fibrillation  at a rate of 63.  The patient denying any chest tightness, pressure,  lightheadedness, dizziness, shortness of breath, orthopnea or PND.  He  states he feels very good, tolerating medications without problems.   PAST MEDICAL HISTORY:  Includes:  1. Congestive heart failure secondary to diastolic dysfunction with a      normal EF.  2. Coronary artery disease status post coronary artery bypass grafting      in 1996.  3. Paroxysmal atrial fibrillation with the patient refusing      anticoagulation therapy.  4. Diabetes.  5. Hypertension.  6. Hyperlipidemia.  7. Chronic renal insufficiency with a baseline creatinine 1.6.  8. History of CVA.  9. Depression.  10.Trace aortic insufficiency and mitral regurgitation by      echocardiogram.  11.Carotid bruits status post carotid Doppler in 2006 showing mild      hard plaque in the bulbs and proximal bifurcations, left greater      than right.  Both ICAs take steep posterior dives.  Right ICA 0-      39%, left ICA 40-59% stenosis.  12.History of BPH.  13.Status post cholecystectomy.   REVIEW OF SYSTEMS:  As stated above.   CURRENT MEDICATIONS:  1. Flomax.  2. Lotrel 5/20 daily.  3.  Metoprolol 50 daily.  4. Vytorin 10/80.  5. Humalog.  6. Lantus insulin.  7. Potassium 20 mEq daily.  8. Furosemide 80 mg daily.   PHYSICAL EXAMINATION:  VITAL SIGNS:  Weight 209, which is down 10 pounds  since November.  Blood pressure 152/80 with a pulse of 65.  A 12-lead  EKG showing atrial fibrillation with a rate of 63.  GENERAL:  Wayne Montoya is in no acute distress.  No jugular vein distention  noted at a 45-degree angle.  LUNGS:  Clear to auscultation bilaterally.  CARDIOVASCULAR:  Reveals S1 and S2, irregularly irregular.  LOWER EXTREMITIES:  Without clubbing, cyanosis, or edema.  NEUROLOGIC:  The patient alert and oriented x3.  Cranial nerves II-XII  grossly intact.   IMPRESSION:  Stable heart failure at this time.  No signs of volume  overload with Lasix 80 mg daily.  However, I am concerned the patient is  apparently staying in atrial fibrillation.  I have asked him to  reconsider Coumadin therapy.  He  once again declines.  He has agreed,  however, to take an aspirin 325 mg daily.  I have also asked him to  increase his potassium to 40 mEq per day.  Lab work obtained on January  3 shows a potassium of 3.4, BUN and creatinine of 15 and 1.6, with a BNP  of 151.  Will continue other medications and have the patient follow up  with Dr. [name] for a routine cardiology visit at next visit.      [name], ACNP  Electronically Signed      Bevelyn Buckles. Bensimhon, MD  Electronically Signed   MB/MedQ  DD: 02/24/2006  DT: 02/24/2006  Job #: [id number]   cc:   Corwin Levins, MD

## 2010-07-02 NOTE — Assessment & Plan Note (Signed)
[facility]                   COUMADIN / CHRONIC HEART FAILURE CLINIC NOTE   PACER, DORN                          MRN:          [id number]  DATE:12/29/2005                            DOB:          11-13-1922    PRIMARY CARE PHYSICIAN:  Dr. Oliver Barre.   CARDIOLOGIST:  Dr. Nicholes Mango.   Wayne Montoya is an 77 year old Caucasian gentleman who is new to the heart  failure clinic.  Wayne Montoya has a history of congestive heart failure  secondary to diastolic dysfunction.  He recently saw Dr. Jonny Ruiz at which time  an EKG was done that showed atrial fibrillation at a controlled rate.  The  patient states Dr. Jonny Ruiz also told him he had some fluid in his lungs by  chest x-ray and his furosemide dose was increased.  The patient states he  did not make these changes yet.  He wanted to wait until we saw him here  today in the heart failure clinic.  He denies any orthopnea or PND.  He does  complain of shortness of breath with exertion and some peripheral  edema.  He is not aware that he is in atrial fibrillation.  He has a known history  of atrial fibrillation and has refused Coumadin in the past and he continues  to do so at this time.   PAST MEDICAL HISTORY:  1. Includes coronary artery disease status post coronary artery bypass      graft in 1996.  2. Congestive heart failure secondary to diastolic dysfunction.      Echocardiogram in January of 2005 showed a normal EF with mild left      ventricular hypertrophy.  3. Paroxysmal atrial fibrillation, refusing Coumadin.  4. Diabetes.  5. Hypertension.  6. Hyperlipidemia.  7. Chronic renal insufficiency with baseline creatinine 1.6.  8. History of cardiovascular accident with left-sided facial droop.   CURRENT MEDICATIONS:  1. Vytorin 10/80 mg daily.  2. Aspirin 325.  3. K-Dur 20 daily.  4. Flomax 0.4 b.i.d.  5. Lotrel 5/20 daily.  6. Glipizide extended release 10 mg daily.  7. Plavix 75.  8.  Toprol XL 50.  9. Metformin extended release 500 mg 2 tablets daily.  10.Citroplan 20 mg daily.  11.Actos 45 mg daily.  12.Furosemide 80 mg b.i.d. (the patient has not started this dose yet on      the furosemide, currently increased by Dr. Jonny Ruiz).   REVIEW OF SYSTEMS:  As stated above.   PHYSICAL EXAMINATION:  Weight 218 pounds, blood pressure 170/80 with a pulse  of 63.  Jugular venous distention 8-10 cm at 45 degree angle.  LUNGS:  Clear to auscultation bilaterally.  CARDIOVASCULAR:  Reveals an S1, S2, irregularly irregular.  No S3, no S4.  ABDOMEN:  Soft, nontender, positive bowel sounds, obese.  LOWER EXTREMITIES:  The patient has +2 pitting edema bilateral lower  extremities.   PLAN:  1. Patient with some volume overload at this time.  Will have the patient      continue the increased furosemide dose of 40 mg b.i.d. already ordered  by Dr. Jonny Ruiz.  I am going to increase his potassium also to 40 mEq      b.i.d., check blood work today and see the patient back within 2 weeks      for reevaluation.  2. Patient aware of the risk associated with cardiovascular      accidents/atrial fibrillation, however he does not wish to be placed on      Coumadin therapy.      [name], ACNP  Electronically Signed      Bevelyn Buckles. Bensimhon, MD  Electronically Signed   MB/MedQ  DD: 12/29/2005  DT: 12/29/2005  Job #: [id number]

## 2010-07-05 ENCOUNTER — Other Ambulatory Visit: Payer: Self-pay | Admitting: Internal Medicine

## 2010-07-05 LAB — PROTIME-INR

## 2010-07-06 ENCOUNTER — Ambulatory Visit (INDEPENDENT_AMBULATORY_CARE_PROVIDER_SITE_OTHER): Payer: Self-pay | Admitting: Cardiovascular Disease

## 2010-07-06 ENCOUNTER — Encounter: Payer: Self-pay | Admitting: Internal Medicine

## 2010-07-06 DIAGNOSIS — R0989 Other specified symptoms and signs involving the circulatory and respiratory systems: Secondary | ICD-10-CM

## 2010-07-06 LAB — POCT INR: INR: 2.9

## 2010-07-15 ENCOUNTER — Encounter: Payer: Self-pay | Admitting: Internal Medicine

## 2010-07-16 ENCOUNTER — Other Ambulatory Visit: Payer: Self-pay | Admitting: Internal Medicine

## 2010-07-19 ENCOUNTER — Encounter: Payer: Self-pay | Admitting: Internal Medicine

## 2010-07-19 LAB — PROTIME-INR

## 2010-07-20 ENCOUNTER — Ambulatory Visit (INDEPENDENT_AMBULATORY_CARE_PROVIDER_SITE_OTHER): Payer: Self-pay | Admitting: *Deleted

## 2010-07-20 DIAGNOSIS — R0989 Other specified symptoms and signs involving the circulatory and respiratory systems: Secondary | ICD-10-CM

## 2010-07-20 LAB — POCT INR: INR: 2.7

## 2010-07-29 ENCOUNTER — Encounter: Payer: Self-pay | Admitting: Internal Medicine

## 2010-07-29 ENCOUNTER — Ambulatory Visit (INDEPENDENT_AMBULATORY_CARE_PROVIDER_SITE_OTHER): Payer: Medicare Other | Admitting: Internal Medicine

## 2010-07-29 VITALS — BP 117/67 | HR 76 | Ht 69.0 in | Wt 210.1 lb

## 2010-07-29 DIAGNOSIS — I4891 Unspecified atrial fibrillation: Secondary | ICD-10-CM

## 2010-07-29 DIAGNOSIS — I509 Heart failure, unspecified: Secondary | ICD-10-CM

## 2010-07-29 DIAGNOSIS — E785 Hyperlipidemia, unspecified: Secondary | ICD-10-CM

## 2010-07-29 DIAGNOSIS — Z79899 Other long term (current) drug therapy: Secondary | ICD-10-CM

## 2010-07-29 DIAGNOSIS — I5032 Chronic diastolic (congestive) heart failure: Secondary | ICD-10-CM

## 2010-07-29 DIAGNOSIS — I2510 Atherosclerotic heart disease of native coronary artery without angina pectoris: Secondary | ICD-10-CM | POA: Insufficient documentation

## 2010-07-29 LAB — BASIC METABOLIC PANEL
BUN: 29 mg/dL — ABNORMAL HIGH (ref 6–23)
CO2: 31 mEq/L (ref 19–32)
Chloride: 102 mEq/L (ref 96–112)
Glucose, Bld: 227 mg/dL — ABNORMAL HIGH (ref 70–99)
Potassium: 3.9 mEq/L (ref 3.5–5.1)

## 2010-07-29 MED ORDER — ATORVASTATIN CALCIUM 80 MG PO TABS: 80.0000 mg | ORAL_TABLET | Freq: Every day | ORAL | Status: DC

## 2010-07-29 NOTE — Progress Notes (Signed)
HPI:  Wayne Montoya is a 77 year old white male patient with atrial fibrillation, HIT, HTN, previous CVA, PUD, CHF secondary to diastolic dysfunction. He has CAD and is s/p CABG in 1999. 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   Myoview 4/11: EF 61% no ischemia or infarct.  Doing well from a cardiac perspective. Had some LE edema and PCP started maxide. Mild improvement.  No orthopnea, PND or chest pain.  Mild chronic dyspnea. Walks with cane or walker to avoid falling again. Still struggling with memory problems. Son is following home INR. Doing well. No bleeding. INR 2.5-2.9  Carotids 0-39% B (9/10)    ROS: All systems negative except as listed in HPI, PMH and Problem List.  Past Medical History  Diagnosis Date  . CHF (congestive heart failure)     secondary to diastolic function. Echo 4/09: EF 55%  . CAD (coronary artery disease)     status post CABG in 1996  . Paroxysmal atrial fibrillation   . Diabetes mellitus     type was not specified  . HTN (hypertension)   . Hyperlipidemia   . Chronic renal insufficiency     with a base line creatinine of 1.6  . Cerebrovascular accident     Hx of it, Left sided facial droop  . Depression   . Carotid bruit     s/p carotids in 2006, showed mild hard plaque in the bulbs and proximal bifurcations, left greater that right. Both ICAs take steep posterior dives. Right ICA 0-39% stenosis; left ICA 40-59% stenosis.  Carrotids 9/10: 0-39%B  . BPH (benign prostatic hyperplasia)     Hx of it.  . S/P cholecystectomy   . Fall     C2 fracture/ 12/15/08    Current Outpatient Prescriptions  Medication Sig Dispense Refill  . amLODipine-benazepril (LOTREL) 10-40 MG per capsule Take 2 capsules by mouth daily.        Marland Kitchen ezetimibe-simvastatin (VYTORIN) 10-80 MG per tablet Take 1 tablet by mouth at bedtime.        . furosemide (LASIX) 40 MG tablet Take 40 mg by mouth daily.        . insulin glargine (LANTUS SOLOSTAR) 100  UNIT/ML injection Inject into the skin 2 (two) times daily.        . NON FORMULARY Diabetic test strips and lancets. Any brand. Check 4 times a day       . omeprazole (PRILOSEC) 20 MG capsule Take 20 mg by mouth daily.        . potassium chloride SA (K-DUR,KLOR-CON) 20 MEQ tablet Take 20 mEq by mouth. 2 TABS  EVERY DAY       . rivastigmine (EXELON) 4.6 mg/24hr Place 1 patch onto the skin daily.        . sitaGLIPtan (JANUVIA) 100 MG tablet Take 100 mg by mouth daily.        . Triamterene-HCTZ (MAXZIDE-25 PO) Take by mouth.        . warfarin (COUMADIN) 5 MG tablet take as directed  45 tablet  3  . potassium chloride SA (K-DUR,KLOR-CON) 20 MEQ tablet Take 20 mEq by mouth daily.        Marland Kitchen DISCONTD: amLODipine-benazepril (LOTREL) 5-20 MG per capsule TAKE 2 CAPSULES BY MOUTH ONCE DAILY  60 capsule  10  . DISCONTD: glipiZIDE (GLUCOTROL) 10 MG 24 hr tablet Take 10 mg by mouth daily.        Marland Kitchen DISCONTD: pantoprazole (PROTONIX) 40 MG tablet  Take 40 mg by mouth daily.           PHYSICAL EXAM: Filed Vitals:   07/29/10 1004  BP: 117/67  Pulse: 76  General:  Elderly. No acute distress Neck: Supple. No JVD. Carotids 2+ no bruits.  Lungs: No tachypnea, clear without wheezing, rales, or rhonchi Cardiovascular: Irregular, PMI not displaced, heart sounds normal  and 2/6 systolic ejection murmur at the left sternal border Abdomen: BS normal. Soft without organomegaly, masses, lesions or tenderness. Extremities: no edema/clubbing. Good distal pulses bilateral SKin: Warm, no lesions or rashes  Neuro: no focal signs   ECG: Atrial fib 74. LVH.  Inferior Qs. Non-specific ST-T wave abnormalities.     ASSESSMENT & PLAN:

## 2010-07-29 NOTE — Assessment & Plan Note (Signed)
Goal LDL < 70. Reviewed Black Box warning with simva 80 particularly in light of the fact that he is on amlodipine as well. Will d/c Vytorin start atorva 80.

## 2010-07-29 NOTE — Assessment & Plan Note (Signed)
No evidence of ischemia. Continue current regimen.   

## 2010-07-29 NOTE — Patient Instructions (Addendum)
Your physician recommends that you schedule a follow-up appointment in: 6 MONTHS WITH DR [name]  Your physician recommends that you return for lab work in: bmet bnp dx 428.32 v58.69  Your physician has recommended you make the following change in your medication: STOP VYTORIN  START ATORVASTATIN 80 MG  1 EVERY DAY

## 2010-07-29 NOTE — Assessment & Plan Note (Signed)
Volume status improved with maxide. Will check labs today.

## 2010-07-29 NOTE — Assessment & Plan Note (Signed)
Chronic. Well rate controlled. Tolerating coumadin.

## 2010-08-02 ENCOUNTER — Ambulatory Visit: Payer: Self-pay | Admitting: Internal Medicine

## 2010-08-03 ENCOUNTER — Telehealth: Payer: Self-pay | Admitting: Internal Medicine

## 2010-08-03 NOTE — Telephone Encounter (Signed)
Test results

## 2010-08-05 NOTE — Telephone Encounter (Signed)
Spoke w/daughter in Social worker, waiting on DB to review labs

## 2010-08-11 NOTE — Telephone Encounter (Signed)
Wayne Montoya's wife states she let him know labs were ok, advised if he has any questions to call me back she will let him know

## 2010-08-12 ENCOUNTER — Ambulatory Visit (INDEPENDENT_AMBULATORY_CARE_PROVIDER_SITE_OTHER): Payer: Self-pay | Admitting: Cardiology

## 2010-08-12 DIAGNOSIS — R0989 Other specified symptoms and signs involving the circulatory and respiratory systems: Secondary | ICD-10-CM

## 2010-08-12 LAB — POCT INR: INR: 2.7

## 2010-08-27 LAB — POCT INR: INR: 2.7

## 2010-08-30 ENCOUNTER — Ambulatory Visit (INDEPENDENT_AMBULATORY_CARE_PROVIDER_SITE_OTHER): Payer: Self-pay | Admitting: Cardiology

## 2010-08-30 DIAGNOSIS — R0989 Other specified symptoms and signs involving the circulatory and respiratory systems: Secondary | ICD-10-CM

## 2010-09-13 ENCOUNTER — Ambulatory Visit (INDEPENDENT_AMBULATORY_CARE_PROVIDER_SITE_OTHER): Payer: Self-pay | Admitting: Cardiology

## 2010-09-16 ENCOUNTER — Encounter: Payer: Self-pay | Admitting: Podiatry

## 2010-09-16 DIAGNOSIS — B351 Tinea unguium: Secondary | ICD-10-CM | POA: Insufficient documentation

## 2010-09-24 ENCOUNTER — Ambulatory Visit (INDEPENDENT_AMBULATORY_CARE_PROVIDER_SITE_OTHER): Payer: Self-pay | Admitting: Cardiology

## 2010-09-24 DIAGNOSIS — R0989 Other specified symptoms and signs involving the circulatory and respiratory systems: Secondary | ICD-10-CM

## 2010-10-01 ENCOUNTER — Ambulatory Visit (INDEPENDENT_AMBULATORY_CARE_PROVIDER_SITE_OTHER): Payer: Self-pay | Admitting: Cardiology

## 2010-10-01 LAB — POCT INR: INR: 3

## 2010-10-08 ENCOUNTER — Ambulatory Visit (INDEPENDENT_AMBULATORY_CARE_PROVIDER_SITE_OTHER): Payer: Self-pay | Admitting: Internal Medicine

## 2010-10-08 ENCOUNTER — Encounter: Payer: Self-pay | Admitting: Internal Medicine

## 2010-10-08 DIAGNOSIS — R0989 Other specified symptoms and signs involving the circulatory and respiratory systems: Secondary | ICD-10-CM

## 2010-10-08 LAB — PROTIME-INR

## 2010-10-19 LAB — POCT INR: INR: 1.2

## 2010-10-20 ENCOUNTER — Ambulatory Visit (INDEPENDENT_AMBULATORY_CARE_PROVIDER_SITE_OTHER): Payer: Self-pay | Admitting: Cardiology

## 2010-10-20 DIAGNOSIS — R0989 Other specified symptoms and signs involving the circulatory and respiratory systems: Secondary | ICD-10-CM

## 2010-11-01 ENCOUNTER — Ambulatory Visit (INDEPENDENT_AMBULATORY_CARE_PROVIDER_SITE_OTHER): Payer: Self-pay | Admitting: Internal Medicine

## 2010-11-01 DIAGNOSIS — R0989 Other specified symptoms and signs involving the circulatory and respiratory systems: Secondary | ICD-10-CM

## 2010-11-05 ENCOUNTER — Other Ambulatory Visit: Payer: Self-pay | Admitting: Internal Medicine

## 2010-11-05 DIAGNOSIS — I6529 Occlusion and stenosis of unspecified carotid artery: Secondary | ICD-10-CM

## 2010-11-05 LAB — CBC
HCT: 40.8
HCT: 41.1
HCT: 41.6
Hemoglobin: 13.8
Hemoglobin: 13.8
MCHC: 33.3
MCHC: 33.9
MCV: 83.1
MCV: 83.2
MCV: 83.2
MCV: 83.3
MCV: 83.4
MCV: 83.7
MCV: 83.8
Platelets: 112 — ABNORMAL LOW
Platelets: 129 — ABNORMAL LOW
Platelets: UNDETERMINED
RBC: 4.9
RBC: 4.91
RBC: 4.93
RBC: 4.94
RBC: 4.97
RDW: 14.3
RDW: 14.4
WBC: 5.9
WBC: 6.9
WBC: 6.9
WBC: 7.6
WBC: 8.4

## 2010-11-05 LAB — HEPARIN LEVEL (UNFRACTIONATED)
Heparin Unfractionated: 0.48
Heparin Unfractionated: 0.5
Heparin Unfractionated: <0.1 — ABNORMAL LOW

## 2010-11-05 LAB — I-STAT 8, (EC8 V) (CONVERTED LAB)
Chloride: 109
Glucose, Bld: 99
Potassium: 5.3 — ABNORMAL HIGH
pCO2, Ven: 25.7 — ABNORMAL LOW
pH, Ven: 7.624

## 2010-11-05 LAB — APTT: aPTT: 30

## 2010-11-05 LAB — HEPARIN INDUCED PLATELET AGGREGATION (CONVERTED LAB)
Heparin 0.1 Donor: 3
Heparin 0.1 Patient: 2
Heparin 1 Donor: 2
Heparin 1 Patient: 1
Interpretation-HITDU: NEGATIVE

## 2010-11-05 LAB — POCT I-STAT 3, ART BLOOD GAS (G3+)
Acid-Base Excess: 2
Bicarbonate: 28 — ABNORMAL HIGH
Operator id: 282671
pCO2 arterial: 47 — ABNORMAL HIGH
pH, Arterial: 7.382
pO2, Arterial: 40 — ABNORMAL LOW

## 2010-11-05 LAB — POCT I-STAT 3, VENOUS BLOOD GAS (G3P V)
Acid-Base Excess: 1
Acid-Base Excess: 3 — ABNORMAL HIGH
Bicarbonate: 26.7 — ABNORMAL HIGH
Bicarbonate: 28.4 — ABNORMAL HIGH
O2 Saturation: 68
O2 Saturation: 68
Operator id: 282671
TCO2: 29
TCO2: 30
pCO2, Ven: 44.6 — ABNORMAL LOW
pCO2, Ven: 46.5
pH, Ven: 7.377 — ABNORMAL HIGH
pH, Ven: 7.384 — ABNORMAL HIGH
pO2, Ven: 36
pO2, Ven: 36
pO2, Ven: 72 — ABNORMAL HIGH

## 2010-11-05 LAB — BASIC METABOLIC PANEL
BUN: 20
BUN: 29 — ABNORMAL HIGH
CO2: 26
CO2: 27
CO2: 28
Calcium: 8.7
Chloride: 103
Chloride: 104
Chloride: 105
Chloride: 106
Creatinine, Ser: 1.42
Creatinine, Ser: 1.42
Creatinine, Ser: 1.57 — ABNORMAL HIGH
GFR calc Af Amer: 53 — ABNORMAL LOW
GFR calc Af Amer: 58 — ABNORMAL LOW
GFR calc non Af Amer: 36 — ABNORMAL LOW
GFR calc non Af Amer: 48 — ABNORMAL LOW
Glucose, Bld: 174 — ABNORMAL HIGH
Glucose, Bld: 181 — ABNORMAL HIGH
Potassium: 3.8
Potassium: 4.1
Potassium: 4.5
Sodium: 138

## 2010-11-05 LAB — PROTIME-INR: INR: 1.2

## 2010-11-05 LAB — COMPREHENSIVE METABOLIC PANEL
Albumin: 3.4 — ABNORMAL LOW
Alkaline Phosphatase: 38 — ABNORMAL LOW
BUN: 25 — ABNORMAL HIGH
Chloride: 107
Glucose, Bld: 146 — ABNORMAL HIGH
Potassium: 3.8
Total Bilirubin: 0.7

## 2010-11-09 ENCOUNTER — Encounter (INDEPENDENT_AMBULATORY_CARE_PROVIDER_SITE_OTHER): Payer: Medicare Other | Admitting: *Deleted

## 2010-11-09 ENCOUNTER — Telehealth: Payer: Self-pay | Admitting: Pharmacist

## 2010-11-09 ENCOUNTER — Telehealth: Payer: Self-pay | Admitting: Internal Medicine

## 2010-11-09 ENCOUNTER — Ambulatory Visit (INDEPENDENT_AMBULATORY_CARE_PROVIDER_SITE_OTHER): Payer: Self-pay | Admitting: Internal Medicine

## 2010-11-09 DIAGNOSIS — I6529 Occlusion and stenosis of unspecified carotid artery: Secondary | ICD-10-CM

## 2010-11-09 NOTE — Telephone Encounter (Signed)
An alert pt/Inr  INR 1.8 today, will be faxing same

## 2010-11-09 NOTE — Telephone Encounter (Signed)
Pt son called to let us know pt is now living in [facility] assisted [facility] in [place].  They do not have physicians that monitor INRs and he wanted to make sure he could continue using his self-testing machine.  The facility has agreed to this and would like [place] to fax dosing changes to them for charting.  Agreed to this plan.     [facility] phone- [phone]; fax- [phone]

## 2010-11-09 NOTE — Telephone Encounter (Signed)
Fax received.  INR dosed.

## 2010-11-16 ENCOUNTER — Telehealth (HOSPITAL_COMMUNITY): Payer: Self-pay | Admitting: *Deleted

## 2010-11-16 NOTE — Telephone Encounter (Signed)
Daughter in law called, stating she is concerned about his legs, looking swollen.  Father in law just put in [address] in [place].  He is going to see Dr Junious Dresser on Friday.  I scheduled him on the 15th of October for here.  Family would like some blood work done possible prior to appt. As well as follow up with Dr Orvan Falconer regarding his visit this Friday.  Dr Orvan Falconer # [phone].

## 2010-11-18 NOTE — Telephone Encounter (Signed)
Wayne Montoya is aware pt is in facility & is going 2 follow & dose coumadin, pt does need INR this week, have left mess for [facility] to call back with report on pt

## 2010-11-26 LAB — POCT I-STAT 4, (NA,K, GLUC, HGB,HCT)
Glucose, Bld: 157 — ABNORMAL HIGH
HCT: 45
Hemoglobin: 15.3
Potassium: 3.9
Sodium: 140

## 2010-11-29 ENCOUNTER — Ambulatory Visit (HOSPITAL_COMMUNITY)
Admission: RE | Admit: 2010-11-29 | Discharge: 2010-11-29 | Disposition: A | Discharge status: Home / Self Care | Payer: Medicare Other | Source: Ambulatory Visit | Attending: Internal Medicine | Admitting: Internal Medicine

## 2010-11-29 VITALS — BP 156/84 | HR 75 | Wt 219.0 lb

## 2010-11-29 DIAGNOSIS — Z7901 Long term (current) use of anticoagulants: Secondary | ICD-10-CM | POA: Insufficient documentation

## 2010-11-29 DIAGNOSIS — Z951 Presence of aortocoronary bypass graft: Secondary | ICD-10-CM | POA: Insufficient documentation

## 2010-11-29 DIAGNOSIS — I509 Heart failure, unspecified: Secondary | ICD-10-CM

## 2010-11-29 DIAGNOSIS — I2510 Atherosclerotic heart disease of native coronary artery without angina pectoris: Secondary | ICD-10-CM | POA: Insufficient documentation

## 2010-11-29 DIAGNOSIS — Z794 Long term (current) use of insulin: Secondary | ICD-10-CM | POA: Insufficient documentation

## 2010-11-29 DIAGNOSIS — E119 Type 2 diabetes mellitus without complications: Secondary | ICD-10-CM | POA: Insufficient documentation

## 2010-11-29 DIAGNOSIS — Z79899 Other long term (current) drug therapy: Secondary | ICD-10-CM | POA: Insufficient documentation

## 2010-11-29 DIAGNOSIS — I6529 Occlusion and stenosis of unspecified carotid artery: Secondary | ICD-10-CM

## 2010-11-29 DIAGNOSIS — I5030 Unspecified diastolic (congestive) heart failure: Secondary | ICD-10-CM | POA: Insufficient documentation

## 2010-11-29 DIAGNOSIS — I5032 Chronic diastolic (congestive) heart failure: Secondary | ICD-10-CM

## 2010-11-29 DIAGNOSIS — Z8711 Personal history of peptic ulcer disease: Secondary | ICD-10-CM | POA: Insufficient documentation

## 2010-11-29 DIAGNOSIS — Z006 Encounter for examination for normal comparison and control in clinical research program: Secondary | ICD-10-CM | POA: Insufficient documentation

## 2010-11-29 DIAGNOSIS — I129 Hypertensive chronic kidney disease with stage 1 through stage 4 chronic kidney disease, or unspecified chronic kidney disease: Secondary | ICD-10-CM | POA: Insufficient documentation

## 2010-11-29 DIAGNOSIS — I4891 Unspecified atrial fibrillation: Secondary | ICD-10-CM | POA: Insufficient documentation

## 2010-11-29 DIAGNOSIS — N400 Enlarged prostate without lower urinary tract symptoms: Secondary | ICD-10-CM | POA: Insufficient documentation

## 2010-11-29 DIAGNOSIS — N189 Chronic kidney disease, unspecified: Secondary | ICD-10-CM | POA: Insufficient documentation

## 2010-11-29 DIAGNOSIS — Z8673 Personal history of transient ischemic attack (TIA), and cerebral infarction without residual deficits: Secondary | ICD-10-CM | POA: Insufficient documentation

## 2010-11-29 LAB — DIFFERENTIAL
Basophils Absolute: 0.1
Eosinophils Relative: 1
Lymphocytes Relative: 14
Monocytes Absolute: 0.8 — ABNORMAL HIGH
Monocytes Relative: 8
Neutro Abs: 7.8 — ABNORMAL HIGH

## 2010-11-29 LAB — BASIC METABOLIC PANEL
BUN: 21 mg/dL (ref 6–23)
CO2: 26
Calcium: 9.1
Chloride: 104 mEq/L (ref 96–112)
GFR calc Af Amer: 59 — ABNORMAL LOW
GFR calc non Af Amer: 49 — ABNORMAL LOW
Glucose, Bld: 219 mg/dL — ABNORMAL HIGH (ref 70–99)
Glucose, Bld: 99
Potassium: 4.1 mEq/L (ref 3.5–5.1)
Potassium: 3.6
Sodium: 139

## 2010-11-29 LAB — HEPATIC FUNCTION PANEL
ALT: 22
AST: 29
Alkaline Phosphatase: 49
Bilirubin, Direct: 0.1
Indirect Bilirubin: 0.5
Total Bilirubin: 0.6

## 2010-11-29 LAB — CBC
HCT: 42.6
Hemoglobin: 14.1
RBC: 5.2
RDW: 15.7 — ABNORMAL HIGH

## 2010-11-29 MED ORDER — FUROSEMIDE 80 MG PO TABS: 80.0000 mg | ORAL_TABLET | Freq: Every day | ORAL | Status: DC

## 2010-11-29 NOTE — Assessment & Plan Note (Signed)
Stable. Well rate controlled. Assisted living Center following INRs.

## 2010-11-29 NOTE — Assessment & Plan Note (Signed)
Still volume overloaded. Dry weight target probably around 208-210. Will increase lasix to 80 daily. Check labs today and in 1 week. Place compression hose. F/u in 2-3 weeks.

## 2010-11-29 NOTE — Patient Instructions (Signed)
Labs today  Increase Furosemide to 80 mg daily  Your physician recommends that you schedule a follow-up appointment in: 2-3 weeks

## 2010-11-29 NOTE — Progress Notes (Signed)
PCP: Junious Dresser, MD - [facility]  HPI:  Wayne Montoya is a 77 year old white male patient with atrial fibrillation, HIT, HTN, previous CVA, PUD, CRI (1.5-2.0)CHF secondary to diastolic dysfunction. He has CAD and is s/p CABG in 1999. 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   Myoview 4/11: EF 61% no ischemia or infarct.  Saw PCP last week. Was volume overloaded so was given Zaroxolyn for 3 days. Weight down 5 pounds.Occasional dyspnea. No CP. Still struggling with memory problems. Son is following home INR. Doing well. No bleeding. INR 2.5-2.9  Carotids 0-39% B (9/12)    ROS: All systems negative except as listed in HPI, PMH and Problem List.  Past Medical History  Diagnosis Date  . CHF (congestive heart failure)     secondary to diastolic function. Echo 4/09: EF 55%  . CAD (coronary artery disease)     status post CABG in 1996  . Paroxysmal atrial fibrillation   . Diabetes mellitus     type was not specified  . HTN (hypertension)   . Hyperlipidemia   . Chronic renal insufficiency     with a base line creatinine of 1.6  . Cerebrovascular accident     Hx of it, Left sided facial droop  . Depression   . Carotid bruit     s/p carotids in 2006, showed mild hard plaque in the bulbs and proximal bifurcations, left greater that right. Both ICAs take steep posterior dives. Right ICA 0-39% stenosis; left ICA 40-59% stenosis.  Carrotids 9/10: 0-39%B  . BPH (benign prostatic hyperplasia)     Hx of it.  . S/P cholecystectomy   . Fall     C2 fracture/ 12/15/08    Current Outpatient Prescriptions  Medication Sig Dispense Refill  . amLODipine-benazepril (LOTREL) 5-20 MG per capsule Take 1 capsule by mouth daily.        Marland Kitchen atorvastatin (LIPITOR) 80 MG tablet Take 1 tablet (80 mg total) by mouth daily.  30 tablet  11  . furosemide (LASIX) 40 MG tablet Take 40 mg by mouth daily.        . insulin glargine (LANTUS) 100 UNIT/ML injection  Inject 70 Units into the skin at bedtime.        . memantine (NAMENDA) 10 MG tablet Take 10 mg by mouth 2 (two) times daily.        . metolazone (ZAROXOLYN) 2.5 MG tablet Take 2.5 mg by mouth continuous as needed. Take 1 tab daily x 3 days when weight is above 220 pounds.       . NON FORMULARY Diabetic test strips and lancets. Any brand. Check 4 times a day       . omeprazole (PRILOSEC) 20 MG capsule Take 20 mg by mouth daily.        . potassium chloride SA (K-DUR,KLOR-CON) 20 MEQ tablet Take 20 mEq by mouth. 2 TABS  EVERY DAY       . sitaGLIPtan (JANUVIA) 100 MG tablet Take 100 mg by mouth daily.        Marland Kitchen warfarin (COUMADIN) 5 MG tablet take as directed  45 tablet  3  . rivastigmine (EXELON) 4.6 mg/24hr Place 1 patch onto the skin daily.        . Triamterene-HCTZ (MAXZIDE-25 PO) Take by mouth.           PHYSICAL EXAM: Filed Vitals:   11/29/10 1024  BP: 156/84  Pulse: 75   Vitals - 1  value per visit 11/29/2010 07/29/2010 08/11/2009 05/21/2009  Weight (lb) 219 210.12 202 198  HEIGHT  5\' 9"  5\' 11"  5\' 11"   BMI 32.33 31.02 28.19 27.63   Vitals - 1 value per visit 05/14/2009 03/10/2009  Weight (lb) 205 190  HEIGHT 5\' 9"  5\' 9"   BMI 30.26 28.05   General:  Elderly. No acute distress Neck: Supple. JVP 10 Carotids 2+ no bruits.  Lungs: No tachypnea, clear without wheezing, rales, or rhonchi Cardiovascular: Irregular, PMI not displaced, heart sounds normal  and 2/6 systolic ejection murmur at the left sternal border Abdomen: BS normal. +distended. Soft without organomegaly, masses, lesions or tenderness. Extremities: 2+ edema. No clubbing. Good distal pulses bilateral SKin: Warm, no lesions or rashes  Neuro: no focal signs   ASSESSMENT & PLAN:

## 2010-11-29 NOTE — Telephone Encounter (Signed)
Pt seen 10/15

## 2010-11-29 NOTE — Assessment & Plan Note (Signed)
No evidence of ischemia. Continue current regimen.   

## 2010-11-29 NOTE — Assessment & Plan Note (Signed)
Mild. Non-obstructive. Continue statin. No further testing needed at this point.

## 2010-12-20 ENCOUNTER — Ambulatory Visit (HOSPITAL_COMMUNITY)
Admission: RE | Admit: 2010-12-20 | Discharge: 2010-12-20 | Disposition: A | Discharge status: Home / Self Care | Payer: Medicare Other | Source: Ambulatory Visit | Attending: Internal Medicine | Admitting: Internal Medicine

## 2010-12-20 DIAGNOSIS — I2510 Atherosclerotic heart disease of native coronary artery without angina pectoris: Secondary | ICD-10-CM

## 2010-12-20 DIAGNOSIS — I509 Heart failure, unspecified: Secondary | ICD-10-CM | POA: Insufficient documentation

## 2010-12-20 DIAGNOSIS — I5032 Chronic diastolic (congestive) heart failure: Secondary | ICD-10-CM | POA: Insufficient documentation

## 2010-12-20 LAB — BASIC METABOLIC PANEL
BUN: 21 mg/dL (ref 6–23)
Calcium: 9.8 mg/dL (ref 8.4–10.5)
Chloride: 102 mEq/L (ref 96–112)
Creatinine, Ser: 1.69 mg/dL — ABNORMAL HIGH (ref 0.50–1.35)
GFR calc Af Amer: 41 mL/min — ABNORMAL LOW (ref >90)
GFR calc non Af Amer: 35 mL/min — ABNORMAL LOW (ref >90)

## 2010-12-20 NOTE — Patient Instructions (Signed)
Please follow up in 2 months. 

## 2010-12-20 NOTE — Assessment & Plan Note (Signed)
No evidence of ischemia. Continue current regimen.   

## 2010-12-20 NOTE — Progress Notes (Signed)
PCP: Junious Dresser, MD - [facility]  HPI:  Wayne Montoya is a 77 year old white male patient with atrial fibrillation, HIT, HTN, previous CVA, PUD, CRI (1.5-2.0)CHF secondary to diastolic dysfunction. He has CAD and is s/p CABG in 1999. 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   Myoview 4/11: EF 61% no ischemia or infarct.  Saw PCP last week. Was volume overloaded so was given Zaroxolyn for 3 days. Weight down 5 pounds.Occasional dyspnea. No CP. Still struggling with memory problems. Son is following home INR. Doing well. No bleeding. INR 2.5-2.9  Carotids 0-39% B (9/12)  10/15 Creatinine 1.7 Potassium 4.1  He is here for follow up. Last visit Lasix increased to 80 mg daily.  SOB on exertion but son reports it is about the same. His weight has been 216-217.  Compression stockings started. Continues to have lower extremity edema. He lives in assisted living [facility]).  He has increased activity. Able to sleep flat in the bed. He is provided a diabetic diet in assisted living. Snacks that are provided are not low salt.    ROS: All systems negative except as listed in HPI, PMH and Problem List.  Past Medical History  Diagnosis Date  . CHF (congestive heart failure)     secondary to diastolic function. Echo 4/09: EF 55%  . CAD (coronary artery disease)     status post CABG in 1996  . Paroxysmal atrial fibrillation   . Diabetes mellitus     type was not specified  . HTN (hypertension)   . Hyperlipidemia   . Chronic renal insufficiency     with a base line creatinine of 1.6  . Cerebrovascular accident     Hx of it, Left sided facial droop  . Depression   . Carotid bruit     s/p carotids in 2006, showed mild hard plaque in the bulbs and proximal bifurcations, left greater that right. Both ICAs take steep posterior dives. Right ICA 0-39% stenosis; left ICA 40-59% stenosis.  Carrotids 9/10: 0-39%B  . BPH (benign prostatic  hyperplasia)     Hx of it.  . S/P cholecystectomy   . Fall     C2 fracture/ 12/15/08    Current Outpatient Prescriptions  Medication Sig Dispense Refill  . amLODipine-benazepril (LOTREL) 5-20 MG per capsule Take 1 capsule by mouth daily.        Marland Kitchen atorvastatin (LIPITOR) 80 MG tablet Take 1 tablet (80 mg total) by mouth daily.  30 tablet  11  . furosemide (LASIX) 80 MG tablet Take 1 tablet (80 mg total) by mouth daily.      . insulin glargine (LANTUS) 100 UNIT/ML injection Inject 70 Units into the skin at bedtime.        . memantine (NAMENDA) 10 MG tablet Take 10 mg by mouth 2 (two) times daily.        . metolazone (ZAROXOLYN) 2.5 MG tablet Take 2.5 mg by mouth continuous as needed. Take 1 tab daily x 3 days when weight is above 220 pounds.       . NON FORMULARY Diabetic test strips and lancets. Any brand. Check 4 times a day       . omeprazole (PRILOSEC) 20 MG capsule Take 20 mg by mouth daily.        . potassium chloride SA (K-DUR,KLOR-CON) 20 MEQ tablet Take 20 mEq by mouth. 2 TABS  EVERY DAY       . rivastigmine (EXELON) 4.6  mg/24hr Place 1 patch onto the skin daily.        . sitaGLIPtan (JANUVIA) 100 MG tablet Take 100 mg by mouth daily.        . Triamterene-HCTZ (MAXZIDE-25 PO) Take by mouth.        . warfarin (COUMADIN) 5 MG tablet take as directed  45 tablet  3     PHYSICAL EXAM: Filed Vitals:   12/20/10 1021  BP: 152/82  Pulse: 92   Vitals - 1 value per visit 11/29/2010 07/29/2010 08/11/2009 05/21/2009  Weight (lb) 219 210.12 202 198  HEIGHT  5\' 9"  5\' 11"  5\' 11"   BMI 32.33 31.02 28.19 27.63   Vitals - 1 value per visit 05/14/2009 03/10/2009  Weight (lb) 205 190  HEIGHT 5\' 9"  5\' 9"   BMI 30.26 28.05   Weight : 221 (219) General:  Elderly. No acute distress Neck: Supple. JVP 7-8 Carotids 2+ no bruits.  Lungs: No tachypnea, RML RLL LLL crackles. Cardiovascular: Irregular, PMI not displaced, heart sounds normal  and 2/6 systolic ejection murmur at the left sternal border    Abdomen: BS normal. +distended. Soft without organomegaly, masses, lesions or tenderness. Extremities: 1+ edema. Teds in place No clubbing.  SKin: Warm, no lesions or rashes  Neuro: no focal signs   ASSESSMENT & PLAN:

## 2010-12-20 NOTE — Assessment & Plan Note (Signed)
Chronic. Well rate controlled. Tolerating coumadin.

## 2010-12-20 NOTE — Assessment & Plan Note (Addendum)
NYHA II-III. Volume status improved. Tolerating medications. Will Check BMET . Continue current regimen. Can continue to use sliding scale diuretics as needed. Follow up in 2 months.   Patient seen and examined with Tonye Becket, NP. We discussed all aspects of the encounter. I agree with the assessment and plan as stated above.

## 2010-12-20 NOTE — Progress Notes (Signed)
Patient seen and examined with Amy Clegg, NP. We discussed all aspects of the encounter. I agree with the assessment and plan as stated above.   

## 2011-03-03 ENCOUNTER — Ambulatory Visit (HOSPITAL_COMMUNITY)
Admission: RE | Admit: 2011-03-03 | Discharge: 2011-03-03 | Disposition: A | Discharge status: Home / Self Care | Payer: Medicare Other | Source: Ambulatory Visit | Attending: Internal Medicine | Admitting: Internal Medicine

## 2011-03-03 VITALS — BP 124/68 | HR 100

## 2011-03-03 DIAGNOSIS — I509 Heart failure, unspecified: Secondary | ICD-10-CM

## 2011-03-03 DIAGNOSIS — I5032 Chronic diastolic (congestive) heart failure: Secondary | ICD-10-CM | POA: Insufficient documentation

## 2011-03-03 DIAGNOSIS — R5383 Other fatigue: Secondary | ICD-10-CM

## 2011-03-03 DIAGNOSIS — I4891 Unspecified atrial fibrillation: Secondary | ICD-10-CM

## 2011-03-03 DIAGNOSIS — R5381 Other malaise: Secondary | ICD-10-CM | POA: Insufficient documentation

## 2011-03-03 NOTE — Assessment & Plan Note (Signed)
Stable Continue coumadin 

## 2011-03-03 NOTE — Assessment & Plan Note (Signed)
Suspect he may have URI. Seems to be improving.

## 2011-03-03 NOTE — Patient Instructions (Signed)
We will contact you in 6 months to schedule your next appointment.  

## 2011-03-03 NOTE — Progress Notes (Signed)
PCP: Junious Dresser, MD - [facility]  HPI:  Huan is a 78 year old white male patient with atrial fibrillation, HIT, HTN, previous CVA, PUD, CRI (1.5-2.0)CHF secondary to diastolic dysfunction. He has CAD and is s/p CABG in 1999. 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   Myoview 4/11: EF 61% no ischemia or infarct.  Carotids 0-39% B (9/12)  10/15 Creatinine 1.7 Potassium 4.1  He is here for follow up with his son.  He lives in assisted living [facility]).  He feels somewhat run down. Weight stable 214-215. +cough. Recently had URI and treated with Tamilfu. Able to walk from his room to dining room without problem. No orthopnea/PND/CP. Son says mobility much better. No f/c.   He has increased activity. Able to sleep flat in the bed. No problem with coumadin.     ROS: All systems negative except as listed in HPI, PMH and Problem List.  Past Medical History  Diagnosis Date  . CHF (congestive heart failure)     secondary to diastolic function. Echo 4/09: EF 55%  . CAD (coronary artery disease)     status post CABG in 1996  . Paroxysmal atrial fibrillation   . Diabetes mellitus     type was not specified  . HTN (hypertension)   . Hyperlipidemia   . Chronic renal insufficiency     with a base line creatinine of 1.6  . Cerebrovascular accident     Hx of it, Left sided facial droop  . Depression   . Carotid bruit     s/p carotids in 2006, showed mild hard plaque in the bulbs and proximal bifurcations, left greater that right. Both ICAs take steep posterior dives. Right ICA 0-39% stenosis; left ICA 40-59% stenosis.  Carrotids 9/10: 0-39%B  . BPH (benign prostatic hyperplasia)     Hx of it.  . S/P cholecystectomy   . Fall     C2 fracture/ 12/15/08    Current Outpatient Prescriptions  Medication Sig Dispense Refill  . amLODipine-benazepril (LOTREL) 5-20 MG per capsule Take 1 capsule by mouth daily.        . Artificial  Tears 0.1-0.3 % SOLN Apply 1 drop to eye 3 (three) times daily.        Marland Kitchen atorvastatin (LIPITOR) 80 MG tablet Take 1 tablet (80 mg total) by mouth daily.  30 tablet  11  . furosemide (LASIX) 80 MG tablet Take 1 tablet (80 mg total) by mouth daily.      . insulin glargine (LANTUS) 100 UNIT/ML injection Inject 50 Units into the skin at bedtime.       Marland Kitchen levothyroxine (SYNTHROID, LEVOTHROID) 25 MCG tablet Take 25 mcg by mouth daily.        . memantine (NAMENDA) 10 MG tablet Take 10 mg by mouth 2 (two) times daily.        . metolazone (ZAROXOLYN) 2.5 MG tablet Take 2.5 mg by mouth continuous as needed. Take 1 tab daily x 3 days when weight is above 220 pounds.       . Multiple Vitamin (MULTIVITAMIN) capsule Take 1 capsule by mouth daily.        . NON FORMULARY Diabetic test strips and lancets. Any brand. Check 4 times a day       . omeprazole (PRILOSEC) 20 MG capsule Take 20 mg by mouth daily.        . potassium chloride SA (K-DUR,KLOR-CON) 20 MEQ tablet Take 20 mEq by  mouth daily.       . sitaGLIPtan (JANUVIA) 100 MG tablet Take 100 mg by mouth daily.        Marland Kitchen warfarin (COUMADIN) 5 MG tablet take as directed  45 tablet  3     PHYSICAL EXAM: Filed Vitals:   03/03/11 1343  BP: 124/68  Pulse: 100  SpO2: 91%    Wt 208 General:  Elderly. No acute distress Neck: Supple. JVP 7-8 Carotids 2+ no bruits.  Lungs: No tachypnea, mild bibasilar crackles. Cardiovascular: Irregular, PMI not displaced, heart sounds normal  and 2/6 systolic ejection murmur at the left sternal border  Abdomen: BS normal. +distended. Soft without organomegaly, masses, lesions or tenderness. Extremities: Tr  edema. Teds in place No clubbing.  SKin: Warm, no lesions or rashes  Neuro: no focal signs   ASSESSMENT & PLAN:

## 2011-03-03 NOTE — Progress Notes (Signed)
Encounter addended by: Noralee Space, RN on: 03/03/2011  2:07 PM<BR>     Documentation filed: Patient Instructions Section

## 2011-03-03 NOTE — Assessment & Plan Note (Signed)
Stable. Volume status looks good. Continue current regimen.  

## 2011-03-04 ENCOUNTER — Ambulatory Visit: Payer: Self-pay | Admitting: Pharmacist

## 2011-03-04 DIAGNOSIS — I4891 Unspecified atrial fibrillation: Secondary | ICD-10-CM

## 2012-06-22 ENCOUNTER — Other Ambulatory Visit: Payer: Self-pay

## 2012-06-22 ENCOUNTER — Emergency Department (HOSPITAL_COMMUNITY): Payer: Medicare Other

## 2012-06-22 ENCOUNTER — Encounter (HOSPITAL_COMMUNITY): Payer: Self-pay | Admitting: Emergency Medicine

## 2012-06-22 ENCOUNTER — Inpatient Hospital Stay (HOSPITAL_COMMUNITY)
Admission: EM | Admit: 2012-06-22 | Discharge: 2012-06-29 | DRG: 193 | Disposition: A | Discharge status: Nursing Home (Includes ICF) | Payer: Medicare Other | Attending: Internal Medicine | Admitting: Internal Medicine

## 2012-06-22 DIAGNOSIS — J189 Pneumonia, unspecified organism: Principal | ICD-10-CM | POA: Diagnosis present

## 2012-06-22 DIAGNOSIS — E039 Hypothyroidism, unspecified: Secondary | ICD-10-CM | POA: Diagnosis present

## 2012-06-22 DIAGNOSIS — Z833 Family history of diabetes mellitus: Secondary | ICD-10-CM

## 2012-06-22 DIAGNOSIS — K279 Peptic ulcer, site unspecified, unspecified as acute or chronic, without hemorrhage or perforation: Secondary | ICD-10-CM | POA: Diagnosis present

## 2012-06-22 DIAGNOSIS — I672 Cerebral atherosclerosis: Secondary | ICD-10-CM | POA: Diagnosis present

## 2012-06-22 DIAGNOSIS — M6281 Muscle weakness (generalized): Secondary | ICD-10-CM

## 2012-06-22 DIAGNOSIS — F0390 Unspecified dementia without behavioral disturbance: Secondary | ICD-10-CM | POA: Diagnosis present

## 2012-06-22 DIAGNOSIS — N189 Chronic kidney disease, unspecified: Secondary | ICD-10-CM | POA: Diagnosis present

## 2012-06-22 DIAGNOSIS — I4891 Unspecified atrial fibrillation: Secondary | ICD-10-CM | POA: Diagnosis present

## 2012-06-22 DIAGNOSIS — G459 Transient cerebral ischemic attack, unspecified: Secondary | ICD-10-CM

## 2012-06-22 DIAGNOSIS — R29898 Other symptoms and signs involving the musculoskeletal system: Secondary | ICD-10-CM | POA: Diagnosis not present

## 2012-06-22 DIAGNOSIS — R0902 Hypoxemia: Secondary | ICD-10-CM | POA: Diagnosis present

## 2012-06-22 DIAGNOSIS — Z880 Allergy status to penicillin: Secondary | ICD-10-CM

## 2012-06-22 DIAGNOSIS — J9600 Acute respiratory failure, unspecified whether with hypoxia or hypercapnia: Secondary | ICD-10-CM | POA: Diagnosis not present

## 2012-06-22 DIAGNOSIS — E119 Type 2 diabetes mellitus without complications: Secondary | ICD-10-CM | POA: Diagnosis present

## 2012-06-22 DIAGNOSIS — F3289 Other specified depressive episodes: Secondary | ICD-10-CM | POA: Diagnosis present

## 2012-06-22 DIAGNOSIS — I5043 Acute on chronic combined systolic (congestive) and diastolic (congestive) heart failure: Secondary | ICD-10-CM | POA: Diagnosis present

## 2012-06-22 DIAGNOSIS — Z794 Long term (current) use of insulin: Secondary | ICD-10-CM

## 2012-06-22 DIAGNOSIS — Z888 Allergy status to other drugs, medicaments and biological substances status: Secondary | ICD-10-CM

## 2012-06-22 DIAGNOSIS — Z006 Encounter for examination for normal comparison and control in clinical research program: Secondary | ICD-10-CM

## 2012-06-22 DIAGNOSIS — J90 Pleural effusion, not elsewhere classified: Secondary | ICD-10-CM

## 2012-06-22 DIAGNOSIS — I10 Essential (primary) hypertension: Secondary | ICD-10-CM | POA: Diagnosis present

## 2012-06-22 DIAGNOSIS — Z951 Presence of aortocoronary bypass graft: Secondary | ICD-10-CM

## 2012-06-22 DIAGNOSIS — I5032 Chronic diastolic (congestive) heart failure: Secondary | ICD-10-CM | POA: Diagnosis present

## 2012-06-22 DIAGNOSIS — I2510 Atherosclerotic heart disease of native coronary artery without angina pectoris: Secondary | ICD-10-CM | POA: Diagnosis present

## 2012-06-22 DIAGNOSIS — I5020 Unspecified systolic (congestive) heart failure: Secondary | ICD-10-CM

## 2012-06-22 DIAGNOSIS — E785 Hyperlipidemia, unspecified: Secondary | ICD-10-CM | POA: Diagnosis present

## 2012-06-22 DIAGNOSIS — F329 Major depressive disorder, single episode, unspecified: Secondary | ICD-10-CM | POA: Diagnosis present

## 2012-06-22 DIAGNOSIS — R410 Disorientation, unspecified: Secondary | ICD-10-CM | POA: Diagnosis present

## 2012-06-22 DIAGNOSIS — Z79899 Other long term (current) drug therapy: Secondary | ICD-10-CM

## 2012-06-22 DIAGNOSIS — T45515A Adverse effect of anticoagulants, initial encounter: Secondary | ICD-10-CM | POA: Diagnosis present

## 2012-06-22 DIAGNOSIS — F05 Delirium due to known physiological condition: Secondary | ICD-10-CM | POA: Diagnosis not present

## 2012-06-22 DIAGNOSIS — I129 Hypertensive chronic kidney disease with stage 1 through stage 4 chronic kidney disease, or unspecified chronic kidney disease: Secondary | ICD-10-CM | POA: Diagnosis present

## 2012-06-22 DIAGNOSIS — I509 Heart failure, unspecified: Secondary | ICD-10-CM

## 2012-06-22 DIAGNOSIS — I679 Cerebrovascular disease, unspecified: Secondary | ICD-10-CM

## 2012-06-22 DIAGNOSIS — Z66 Do not resuscitate: Secondary | ICD-10-CM | POA: Diagnosis present

## 2012-06-22 DIAGNOSIS — Z7901 Long term (current) use of anticoagulants: Secondary | ICD-10-CM

## 2012-06-22 DIAGNOSIS — R791 Abnormal coagulation profile: Secondary | ICD-10-CM | POA: Diagnosis present

## 2012-06-22 DIAGNOSIS — R531 Weakness: Secondary | ICD-10-CM | POA: Diagnosis present

## 2012-06-22 DIAGNOSIS — I5021 Acute systolic (congestive) heart failure: Secondary | ICD-10-CM

## 2012-06-22 DIAGNOSIS — E876 Hypokalemia: Secondary | ICD-10-CM | POA: Diagnosis not present

## 2012-06-22 DIAGNOSIS — N400 Enlarged prostate without lower urinary tract symptoms: Secondary | ICD-10-CM | POA: Diagnosis present

## 2012-06-22 DIAGNOSIS — Z87891 Personal history of nicotine dependence: Secondary | ICD-10-CM

## 2012-06-22 DIAGNOSIS — Z8673 Personal history of transient ischemic attack (TIA), and cerebral infarction without residual deficits: Secondary | ICD-10-CM

## 2012-06-22 HISTORY — DX: Unspecified dementia, unspecified severity, without behavioral disturbance, psychotic disturbance, mood disturbance, and anxiety: F03.90

## 2012-06-22 LAB — BASIC METABOLIC PANEL
CO2: 26 mEq/L (ref 19–32)
Chloride: 101 mEq/L (ref 96–112)
Creatinine, Ser: 2.08 mg/dL — ABNORMAL HIGH (ref 0.50–1.35)
GFR calc Af Amer: 31 mL/min — ABNORMAL LOW (ref >90)
Sodium: 139 mEq/L (ref 135–145)

## 2012-06-22 LAB — POCT I-STAT 3, ART BLOOD GAS (G3+)
Bicarbonate: 28 mEq/L — ABNORMAL HIGH (ref 20.0–24.0)
Patient temperature: 98.6
TCO2: 29 mmol/L (ref 0–100)
pCO2 arterial: 46.1 mmHg — ABNORMAL HIGH (ref 35.0–45.0)
pH, Arterial: 7.392 (ref 7.350–7.450)
pO2, Arterial: 82 mmHg (ref 80.0–100.0)

## 2012-06-22 LAB — CBC WITH DIFFERENTIAL/PLATELET
Basophils Absolute: 0 10*3/uL (ref 0.0–0.1)
Basophils Relative: 0 % (ref 0–1)
HCT: 40.6 % (ref 39.0–52.0)
Lymphocytes Relative: 9 % — ABNORMAL LOW (ref 12–46)
MCHC: 32.8 g/dL (ref 30.0–36.0)
Monocytes Absolute: 1 10*3/uL (ref 0.1–1.0)
Neutro Abs: 10.3 10*3/uL — ABNORMAL HIGH (ref 1.7–7.7)
Neutrophils Relative %: 83 % — ABNORMAL HIGH (ref 43–77)
Platelets: 140 10*3/uL — ABNORMAL LOW (ref 150–400)
RDW: 15.6 % — ABNORMAL HIGH (ref 11.5–15.5)
WBC: 12.4 10*3/uL — ABNORMAL HIGH (ref 4.0–10.5)

## 2012-06-22 LAB — URINALYSIS, ROUTINE W REFLEX MICROSCOPIC
Bilirubin Urine: NEGATIVE
Ketones, ur: NEGATIVE mg/dL
Leukocytes, UA: NEGATIVE
Nitrite: NEGATIVE
Specific Gravity, Urine: 1.016 (ref 1.005–1.030)
Urobilinogen, UA: 1 mg/dL (ref 0.0–1.0)

## 2012-06-22 LAB — CG4 I-STAT (LACTIC ACID): Lactic Acid, Venous: 1.55 mmol/L (ref 0.5–2.2)

## 2012-06-22 LAB — URINE MICROSCOPIC-ADD ON

## 2012-06-22 LAB — GLUCOSE, CAPILLARY: Glucose-Capillary: 149 mg/dL — ABNORMAL HIGH (ref 70–99)

## 2012-06-22 LAB — PRO B NATRIURETIC PEPTIDE: Pro B Natriuretic peptide (BNP): 4692 pg/mL — ABNORMAL HIGH (ref 0–450)

## 2012-06-22 MED ORDER — ONDANSETRON HCL 4 MG/2ML IJ SOLN: 4.0000 mg | Freq: Four times a day (QID) | INTRAMUSCULAR | Status: DC | PRN

## 2012-06-22 MED ORDER — WARFARIN SODIUM 5 MG PO TABS
5.0000 mg | ORAL_TABLET | ORAL | Status: DC
  Filled 2012-06-22: qty 1

## 2012-06-22 MED ORDER — VANCOMYCIN HCL IN DEXTROSE 1-5 GM/200ML-% IV SOLN
1000.0000 mg | INTRAVENOUS | Status: DC
  Administered 2012-06-23 – 2012-06-28 (×6): 1000 mg via INTRAVENOUS
  Filled 2012-06-22 (×7): qty 200

## 2012-06-22 MED ORDER — LEVOTHYROXINE SODIUM 50 MCG PO TABS
50.0000 ug | ORAL_TABLET | Freq: Every day | ORAL | Status: DC
  Administered 2012-06-23 – 2012-06-29 (×7): 50 ug via ORAL
  Filled 2012-06-22 (×8): qty 1

## 2012-06-22 MED ORDER — SODIUM CHLORIDE 0.9 % IV SOLN
INTRAVENOUS | Status: DC
  Administered 2012-06-22: via INTRAVENOUS

## 2012-06-22 MED ORDER — POTASSIUM CHLORIDE CRYS ER 20 MEQ PO TBCR
20.0000 meq | EXTENDED_RELEASE_TABLET | Freq: Every day | ORAL | Status: DC
  Filled 2012-06-22: qty 1

## 2012-06-22 MED ORDER — ALUM & MAG HYDROXIDE-SIMETH 200-200-20 MG/5ML PO SUSP: 30.0000 mL | Freq: Four times a day (QID) | ORAL | Status: DC | PRN

## 2012-06-22 MED ORDER — ACETAMINOPHEN 325 MG PO TABS
650.0000 mg | ORAL_TABLET | Freq: Four times a day (QID) | ORAL | Status: DC | PRN
  Administered 2012-06-24 – 2012-06-28 (×2): 650 mg via ORAL
  Filled 2012-06-22 (×2): qty 2

## 2012-06-22 MED ORDER — VANCOMYCIN HCL 10 G IV SOLR
2000.0000 mg | Freq: Once | INTRAVENOUS | Status: AC
  Administered 2012-06-22: 2000 mg via INTRAVENOUS
  Filled 2012-06-22: qty 2000

## 2012-06-22 MED ORDER — ACETAMINOPHEN 650 MG RE SUPP: 650.0000 mg | Freq: Four times a day (QID) | RECTAL | Status: DC | PRN

## 2012-06-22 MED ORDER — OXYCODONE HCL 5 MG PO TABS: 5.0000 mg | ORAL_TABLET | ORAL | Status: DC | PRN

## 2012-06-22 MED ORDER — WARFARIN SODIUM 7.5 MG PO TABS: 7.5000 mg | ORAL_TABLET | ORAL | Status: DC

## 2012-06-22 MED ORDER — FUROSEMIDE 80 MG PO TABS
80.0000 mg | ORAL_TABLET | Freq: Every day | ORAL | Status: DC
  Administered 2012-06-23 (×2): 80 mg via ORAL
  Filled 2012-06-22: qty 1

## 2012-06-22 MED ORDER — MULTIVITAMINS PO CAPS
1.0000 | ORAL_CAPSULE | Freq: Every day | ORAL | Status: DC
  Administered 2012-06-23 – 2012-06-24 (×2): 1 via ORAL
  Filled 2012-06-22 (×3): qty 1

## 2012-06-22 MED ORDER — ONDANSETRON HCL 4 MG PO TABS: 4.0000 mg | ORAL_TABLET | Freq: Four times a day (QID) | ORAL | Status: DC | PRN

## 2012-06-22 MED ORDER — METOLAZONE 2.5 MG PO TABS
2.5000 mg | ORAL_TABLET | Freq: Every day | ORAL | Status: DC | PRN
  Filled 2012-06-22: qty 1

## 2012-06-22 MED ORDER — INSULIN ASPART 100 UNIT/ML ~~LOC~~ SOLN
0.0000 [IU] | Freq: Every day | SUBCUTANEOUS | Status: DC
  Administered 2012-06-28: 3 [IU] via SUBCUTANEOUS

## 2012-06-22 MED ORDER — DEXTROSE 5 % IV SOLN
1.0000 g | Freq: Three times a day (TID) | INTRAVENOUS | Status: DC
  Administered 2012-06-22 – 2012-06-29 (×21): 1 g via INTRAVENOUS
  Filled 2012-06-22 (×26): qty 1

## 2012-06-22 MED ORDER — FUROSEMIDE 10 MG/ML IJ SOLN
80.0000 mg | Freq: Once | INTRAMUSCULAR | Status: AC
  Administered 2012-06-22: 80 mg via INTRAVENOUS
  Filled 2012-06-22: qty 8

## 2012-06-22 MED ORDER — ATORVASTATIN CALCIUM 80 MG PO TABS
80.0000 mg | ORAL_TABLET | Freq: Every day | ORAL | Status: DC
  Administered 2012-06-23 – 2012-06-29 (×7): 80 mg via ORAL
  Filled 2012-06-22 (×7): qty 1

## 2012-06-22 MED ORDER — INSULIN GLARGINE 100 UNIT/ML ~~LOC~~ SOLN
60.0000 [IU] | Freq: Two times a day (BID) | SUBCUTANEOUS | Status: DC
  Administered 2012-06-22 – 2012-06-23 (×2): 60 [IU] via SUBCUTANEOUS
  Filled 2012-06-22 (×3): qty 0.6

## 2012-06-22 MED ORDER — ZOLPIDEM TARTRATE 5 MG PO TABS
5.0000 mg | ORAL_TABLET | Freq: Every evening | ORAL | Status: DC | PRN
  Administered 2012-06-27: 5 mg via ORAL
  Filled 2012-06-22: qty 1

## 2012-06-22 MED ORDER — INSULIN ASPART 100 UNIT/ML ~~LOC~~ SOLN
0.0000 [IU] | Freq: Three times a day (TID) | SUBCUTANEOUS | Status: DC
  Administered 2012-06-26: 3 [IU] via SUBCUTANEOUS
  Administered 2012-06-27 (×2): 2 [IU] via SUBCUTANEOUS
  Administered 2012-06-27: 13:00:00 via SUBCUTANEOUS
  Administered 2012-06-28: 3 [IU] via SUBCUTANEOUS
  Administered 2012-06-28: 2 [IU] via SUBCUTANEOUS
  Administered 2012-06-28: 1 [IU] via SUBCUTANEOUS
  Administered 2012-06-29 (×2): 2 [IU] via SUBCUTANEOUS

## 2012-06-22 MED ORDER — FUROSEMIDE 10 MG/ML IJ SOLN: 40.0000 mg | Freq: Once | INTRAMUSCULAR | Status: DC

## 2012-06-22 MED ORDER — DEXTROSE 5 % IV SOLN: 1.0000 g | Freq: Two times a day (BID) | INTRAVENOUS | Status: DC

## 2012-06-22 MED ORDER — WARFARIN - PHYSICIAN DOSING INPATIENT: Freq: Every day | Status: DC

## 2012-06-22 MED ORDER — PANTOPRAZOLE SODIUM 40 MG PO TBEC
40.0000 mg | DELAYED_RELEASE_TABLET | Freq: Every day | ORAL | Status: DC
  Administered 2012-06-23 – 2012-06-29 (×7): 40 mg via ORAL
  Filled 2012-06-22 (×7): qty 1

## 2012-06-22 MED ORDER — HYDROMORPHONE HCL PF 1 MG/ML IJ SOLN: 0.5000 mg | INTRAMUSCULAR | Status: DC | PRN

## 2012-06-22 NOTE — ED Notes (Signed)
Pt to ED via [facility] with c/o just not feeling well for past week.  Has had dry cough, productive at time.

## 2012-06-22 NOTE — H&P (Signed)
[facility] History and Physical  Wayne Montoya ZOX:[id number] DOB: 12-21-1922 DOA: 06/22/2012  Referring physician: EDP PCP: Junious Dresser, MD  Specialists:   Chief Complaint: SOB   HPI: Wayne Montoya is a 79 y.o. male with multiple medical problems who was brought to the ED from his [facility] in [place] due to SOB and hypersomnulence today.  Per his family who are at the bedside, he had malaise and cough and congestion all week.  He developed a  temperature to 100.3 today and was also noticed to be listing to his right side, so his family had him transferred to [facility] for further evaluation.   He was also reported as having a tremor in his right arm of which his family had not noticed before.     Review of Systems: The patient denies anorexia, fever, weight loss,, vision loss, decreased hearing, hoarseness, chest pain, syncope, dyspnea on exertion, peripheral edema, balance deficits, hemoptysis, abdominal pain, melena, hematochezia, severe indigestion/heartburn, hematuria, incontinence, muscle weakness, suspicious skin lesions, transient blindness, difficulty walking, depression, unusual weight change, abnormal bleeding, enlarged lymph nodes, angioedema, and breast masses.    Past Medical History  Diagnosis Date  . CHF (congestive heart failure)     secondary to diastolic function. Echo 4/09: EF 55%  . CAD (coronary artery disease)     status post CABG in 1996  . Paroxysmal atrial fibrillation   . Diabetes mellitus     type was not specified  . HTN (hypertension)   . Hyperlipidemia   . Chronic renal insufficiency     with a base line creatinine of 1.6  . Cerebrovascular accident     Hx of it, Left sided facial droop  . Depression   . Carotid bruit     s/p carotids in 2006, showed mild hard plaque in the bulbs and proximal bifurcations, left greater that right. Both ICAs take steep posterior dives. Right ICA 0-39% stenosis; left ICA 40-59% stenosis.   Carrotids 9/20: 0-39%  . BPH (benign prostatic hyperplasia)     Hx of it.  . S/P cholecystectomy   . Fall     C2 fracture/ 12/15/08     Past Surgical History  Procedure Laterality Date  . Coronary artery bypass graft       Medications:  HOME MEDS: Prior to Admission medications   Medication Sig Start Date End Date Taking? Authorizing Provider  acetaminophen (TYLENOL) 500 MG tablet Take 1,000 mg by mouth every 4 (four) hours as needed for pain or fever.   Yes Historical Provider, MD  alum & mag hydroxide-simeth (MAALOX/MYLANTA) 200-200-20 MG/5ML suspension Take 30 mLs by mouth every 6 (six) hours as needed for indigestion.   Yes Historical Provider, MD  amLODipine-benazepril (LOTREL) 5-20 MG per capsule Take 1 capsule by mouth daily.     Yes Historical Provider, MD  anti-nausea (EMETROL) solution Take 30 mLs by mouth every 30 (thirty) minutes as needed for nausea (up to 4 times a day).   Yes Historical Provider, MD  Artificial Tears 0.1-0.3 % SOLN Place 1 drop into both eyes 3 (three) times daily.    Yes Historical Provider, MD  atorvastatin (LIPITOR) 80 MG tablet Take 80 mg by mouth daily. 07/29/10  Yes Dolores Patty, MD  furosemide (LASIX) 80 MG tablet Take 1 tablet (80 mg total) by mouth daily. 11/29/10  Yes Dolores Patty, MD  insulin aspart (NOVOLOG) 100 UNIT/ML injection Inject 12 Units into the skin 3 (three) times daily with  meals as needed for high blood sugar (hold if CBG is less than 100).   Yes Historical Provider, MD  insulin glargine (LANTUS) 100 UNIT/ML injection Inject 60 Units into the skin 2 (two) times daily.    Yes Historical Provider, MD  levothyroxine (SYNTHROID, LEVOTHROID) 50 MCG tablet Take 50 mcg by mouth daily before breakfast.   Yes Historical Provider, MD  loperamide (IMODIUM) 2 MG capsule Take 2 mg by mouth 4 (four) times daily as needed for diarrhea or loose stools.   Yes Historical Provider, MD  magnesium hydroxide (MILK OF MAGNESIA) 400 MG/5ML  suspension Take 30 mLs by mouth 2 (two) times daily as needed for constipation.   Yes Historical Provider, MD  metolazone (ZAROXOLYN) 2.5 MG tablet Take 2.5 mg by mouth daily as needed (when weight is above 220 pounds.). 30 minutes prior to lasix  when weight is above 220 pounds.   Yes Historical Provider, MD  Multiple Vitamin (MULTIVITAMIN) capsule Take 1 capsule by mouth daily.    Yes Historical Provider, MD  omeprazole (PRILOSEC) 20 MG capsule Take 20 mg by mouth daily.     Yes Historical Provider, MD  potassium chloride SA (K-DUR,KLOR-CON) 20 MEQ tablet Take 20 mEq by mouth daily.    Yes Historical Provider, MD  sodium phosphate (FLEET) enema Place 1 enema rectally as needed (if constipation not relieved by milk of magnesia). follow package directions   Yes Historical Provider, MD  warfarin (COUMADIN) 5 MG tablet Take 5 mg by mouth every other day.   Yes Historical Provider, MD  warfarin (COUMADIN) 7.5 MG tablet Take 7.5 mg by mouth every other day.   Yes Historical Provider, MD  NON FORMULARY Diabetic test strips and lancets. Any brand. Check 4 times a day     Historical Provider, MD     Allergies:  Allergies  Allergen Reactions  . Moxifloxacin Other (See Comments)    Per MAR  . Penicillins Other (See Comments)    Per MAR     Social History: Lives in an Assisted Living Center in [place] He quit Smoking 45 years ago;  And he reports that he does not drink alcohol or use illicit drugs.    Family History:   Unable to Obtain from the Patient    Physical Exam:  GEN:  Pleasant  Elderly well developed 79 year old Caucasian Male examined  and in no acute distress; cooperative with exam Filed Vitals:   06/22/12 2145 06/22/12 2150 06/22/12 2200 06/22/12 2215  BP:  147/81 124/76   Pulse: 97  85 88  Temp:      TempSrc:      Resp: 24 24 24 23   Height:      Weight:      SpO2: 95% 96% 95% 95%   Blood pressure 124/76, pulse 88, temperature 101.1 F (38.4 C), temperature source  Rectal, resp. rate 23, height 5\' 10"  (1.778 m), weight 97.07 kg (214 lb), SpO2 95.00%. PSYCH: He is alert and oriented x4; does not appear anxious does not appear depressed; affect is normal HEENT: Normocephalic and Atraumatic,    Chronic Left facial droop, and left lid lag,   Mucous membranes pink; PERRLA; EOM intact; Fundi:  Benign;  No scleral icterus, Nares: Patent, Oropharynx: Clear, Fair Dentition, Neck:  FROM, no cervical lymphadenopathy nor thyromegaly or carotid bruit; no JVD; Breasts:: Not examined CHEST WALL: No tenderness CHEST: Normal respiration, clear to auscultation bilaterally HEART: Regular rate and rhythm; no murmurs rubs or gallops BACK: No  kyphosis or scoliosis; no CVA tenderness ABDOMEN: Positive Bowel Sounds, soft non-tender; no masses, no organomegaly. Rectal Exam: Not done EXTREMITIES: No  cyanosis, clubbing or  1+ BLE EDEMA; no ulcerations. Genitalia: not examined PULSES: 2+ and symmetric SKIN: Normal hydration no rash or ulceration CNS: Cranial nerves 2-12 grossly intact no focal neurologic deficit   Labs & Imaging Results for orders placed during the hospital encounter of 06/22/12 (from the past 48 hour(s))  CBC WITH DIFFERENTIAL     Status: Abnormal   Collection Time    06/22/12  7:07 PM      Result Value Range   WBC 12.4 (*) 4.0 - 10.5 K/uL   RBC 4.96  4.22 - 5.81 MIL/uL   Hemoglobin 13.3  13.0 - 17.0 g/dL   HCT 47.8  29.5 - 62.1 %   MCV 81.9  78.0 - 100.0 fL   MCH 26.8  26.0 - 34.0 pg   MCHC 32.8  30.0 - 36.0 g/dL   RDW 30.8 (*) 65.7 - 84.6 %   Platelets 140 (*) 150 - 400 K/uL   Neutrophils Relative 83 (*) 43 - 77 %   Neutro Abs 10.3 (*) 1.7 - 7.7 K/uL   Lymphocytes Relative 9 (*) 12 - 46 %   Lymphs Abs 1.1  0.7 - 4.0 K/uL   Monocytes Relative 8  3 - 12 %   Monocytes Absolute 1.0  0.1 - 1.0 K/uL   Eosinophils Relative 0  0 - 5 %   Eosinophils Absolute 0.0  0.0 - 0.7 K/uL   Basophils Relative 0  0 - 1 %   Basophils Absolute 0.0  0.0 - 0.1 K/uL   BASIC METABOLIC PANEL     Status: Abnormal   Collection Time    06/22/12  7:07 PM      Result Value Range   Sodium 139  135 - 145 mEq/L   Potassium 3.9  3.5 - 5.1 mEq/L   Chloride 101  96 - 112 mEq/L   CO2 26  19 - 32 mEq/L   Glucose, Bld 85  70 - 99 mg/dL   BUN 26 (*) 6 - 23 mg/dL   Creatinine, Ser 9.62 (*) 0.50 - 1.35 mg/dL   Calcium 9.0  8.4 - 95.2 mg/dL   GFR calc non Af Amer 27 (*) >90 mL/min   GFR calc Af Amer 31 (*) >90 mL/min   Comment:            The eGFR has been calculated     using the CKD EPI equation.     This calculation has not been     validated in all clinical     situations.     eGFR's persistently     <90 mL/min signify     possible Chronic Kidney Disease.  PRO B NATRIURETIC PEPTIDE     Status: Abnormal   Collection Time    06/22/12  7:07 PM      Result Value Range   Pro B Natriuretic peptide (BNP) 4692.0 (*) 0 - 450 pg/mL  CG4 I-STAT (LACTIC ACID)     Status: None   Collection Time    06/22/12  7:30 PM      Result Value Range   Lactic Acid, Venous 1.55  0.5 - 2.2 mmol/L  POCT I-STAT 3, BLOOD GAS (G3+)     Status: Abnormal   Collection Time    06/22/12  9:06 PM      Result Value Range  pH, Arterial 7.392  7.350 - 7.450   pCO2 arterial 46.1 (*) 35.0 - 45.0 mmHg   pO2, Arterial 82.0  80.0 - 100.0 mmHg   Bicarbonate 28.0 (*) 20.0 - 24.0 mEq/L   TCO2 29  0 - 100 mmol/L   O2 Saturation 96.0     Acid-Base Excess 2.0  0.0 - 2.0 mmol/L   Patient temperature 98.6 F     Collection site RADIAL, ALLEN'S TEST ACCEPTABLE     Drawn by RT     Sample type ARTERIAL    URINALYSIS, ROUTINE W REFLEX MICROSCOPIC     Status: Abnormal   Collection Time    06/22/12  9:39 PM      Result Value Range   Color, Urine YELLOW  YELLOW   APPearance CLEAR  CLEAR   Specific Gravity, Urine 1.016  1.005 - 1.030   pH 5.5  5.0 - 8.0   Glucose, UA NEGATIVE  NEGATIVE mg/dL   Hgb urine dipstick MODERATE (*) NEGATIVE   Bilirubin Urine NEGATIVE  NEGATIVE   Ketones, ur NEGATIVE   NEGATIVE mg/dL   Protein, ur >782 (*) NEGATIVE mg/dL   Urobilinogen, UA 1.0  0.0 - 1.0 mg/dL   Nitrite NEGATIVE  NEGATIVE   Leukocytes, UA NEGATIVE  NEGATIVE  URINE MICROSCOPIC-ADD ON     Status: Abnormal   Collection Time    06/22/12  9:39 PM      Result Value Range   RBC / HPF 7-10  <3 RBC/hpf   Bacteria, UA RARE  RARE   Casts HYALINE CASTS (*) NEGATIVE   Urine-Other AMORPHOUS URATES/PHOSPHATES      Radiological Exams on Admission: Dg Chest 2 View  06/22/2012  *RADIOLOGY REPORT*  Clinical Data: Weakness, cough, shortness of breath, history hypertension, diabetes, CHF, coronary artery disease, stroke  CHEST - 2 VIEW  Comparison: 08/03/2011  Findings: Enlargement of cardiac silhouette post CABG. Atherosclerotic ossification aorta. Low lung volumes with mild right basilar atelectasis. New atelectasis versus consolidation left lower lobe. Question small left pleural effusion. Mild chronic central peribronchial thickening. No pneumothorax. Osseous demineralization with advanced left glenohumeral degenerative changes.  IMPRESSION: Enlargement of cardiac silhouette with pulmonary vascular congestion post CABG. New left lower lobe atelectasis versus consolidation and mild right basilar atelectasis.   Original Report Authenticated By: Ulyses Southward, M.D.     EKG: Independently reviewed.   Assessment/Plan Principal Problem:   HCAP (healthcare-associated pneumonia) Active Problems:   Hypoxemia   DIABETES MELLITUS, TYPE II   HYPERTENSION   Chronic diastolic heart failure   Right sided weakness   Leukocytosis   Coagulopathy due to Coumadin      1.  HCAP- IV Vancomycin and Aztreonam, Nebs, O2 PRN.     2.  Chronic Diastolic CHF-  IV Lasix X 1 given in ED, Monitor for evidence of fluid overload.     3.  Hypoxemia- Monitor O2 saturations, O2 PRN.     4.  Right Sided Weakness-  CT scan of head ordered, neuro checks.    5.  DM 2-  SSI coverage PRN.     6.  Leukocytosis- Monitor trend.      7.  Coaguloapthy due to Coumadin Rx-  Continue coumadin for now. Adjust PRN.        Code Status:     FULL CODE Family Communication:     Family at Bedside Disposition Plan:    Return to Asst Living on discharge    Time spent: 74 Minutes  Carly Applegate C [facility] Pager [phone]  If 7PM-7AM, please contact night-coverage [email or web address] Password [email or web address] 06/22/2012, 10:45 PM

## 2012-06-22 NOTE — ED Provider Notes (Signed)
History     CSN: [id number]  Arrival date & time 06/22/12  1802   First MD Initiated Contact with Patient 06/22/12 1812      Chief Complaint  Patient presents with  . Cough    (Consider location/radiation/quality/duration/timing/severity/associated sxs/prior treatment) HPI Wayne Montoya is a 79 y.o. male With a history of CHF, CAD, Afib, DM 2, HTN, CVA, CKD, and dementia who resides at a nursing home.  He complains of a cough.  Onset was about 3 days ago.  He states he has been feeling bad all week.  He has been more tired than usual and just generally does not feel well.  His cough has been moderate in severity and non-productive.  Today, he requested that his nursing home send him to see a doctor.  Family was called and came out to see the patient.  They found him to be somnolent and confused.  He is normally jovial and very conversational; however, all he wanted to do was sleep, and he was saying things that did not make sense in context of conversation, which is unusual for him.  In addition to cough and confusion, he complains of mild dyspnea at rest.  His temperature was elevated to 100.3 at the nursing home.  He denies headache, neck pain, nausea, vomiting, diarrhea, wounds, abdominal pain, chest pain, dysuria, urinary frequency, or other symptoms.  No recent hospitalization but resides in a nursing home.  He has a severe penicillin allergy (anaphylaxis).  No recent antibiotic use.  Also, he has a history of diastolic CHF with EF of about 40% by last echo (4/09).   Past Medical History  Diagnosis Date  . CHF (congestive heart failure)     secondary to diastolic function. Echo 4/09: EF 55%  . CAD (coronary artery disease)     status post CABG in 1996  . Paroxysmal atrial fibrillation   . Diabetes mellitus     type was not specified  . HTN (hypertension)   . Hyperlipidemia   . Chronic renal insufficiency     with a base line creatinine of 1.6  . Cerebrovascular accident      Hx of it, Left sided facial droop  . Depression   . Carotid bruit     s/p carotids in 2006, showed mild hard plaque in the bulbs and proximal bifurcations, left greater that right. Both ICAs take steep posterior dives. Right ICA 0-39% stenosis; left ICA 40-59% stenosis.  Carrotids 9/20: 0-39%  . BPH (benign prostatic hyperplasia)     Hx of it.  . S/P cholecystectomy   . Fall     C2 fracture/ 12/15/08  . Dementia     Past Surgical History  Procedure Laterality Date  . Coronary artery bypass graft      History reviewed. No pertinent family history.  History  Substance Use Topics  . Smoking status: Former Smoker    Quit date: 06/22/1972  . Smokeless tobacco: Not on file  . Alcohol Use: No      Review of Systems  Constitutional: Positive for fever, activity change, appetite change and fatigue. Negative for chills.  HENT: Negative for congestion, rhinorrhea, neck pain and neck stiffness.   Eyes: Negative for visual disturbance.  Respiratory: Positive for cough and shortness of breath.   Cardiovascular: Negative for chest pain and leg swelling.  Gastrointestinal: Negative for nausea, vomiting, abdominal pain and diarrhea.  Genitourinary: Negative for dysuria, urgency, frequency, flank pain and difficulty urinating.  Musculoskeletal: Negative for  back pain.  Skin: Negative for rash.  Neurological: Negative for syncope, weakness, numbness and headaches.  Psychiatric/Behavioral: Positive for confusion.  All other systems reviewed and are negative.    Allergies  Heparin; Moxifloxacin; and Penicillins  Home Medications   No current outpatient prescriptions on file.  BP 151/92  Pulse 95  Temp(Src) 98.9 F (37.2 C) (Oral)  Resp 22  Ht 5\' 10"  (1.778 m)  Wt 208 lb 12.4 oz (94.7 kg)  BMI 29.96 kg/m2  SpO2 95%  Physical Exam  Nursing note and vitals reviewed. Constitutional: He is oriented to person, place, and time. He appears well-developed and well-nourished. No  distress.  somnolent  HENT:  Head: Normocephalic and atraumatic.  Mouth/Throat: Oropharynx is clear and moist.  Eyes: Conjunctivae and EOM are normal. Pupils are equal, round, and reactive to light. No scleral icterus.  Neck: Normal range of motion. Neck supple. No JVD present. No Brudzinski's sign and no Kernig's sign noted.  Cardiovascular: Regular rhythm, normal heart sounds and intact distal pulses.  Tachycardia present.  Exam reveals no gallop and no friction rub.   No murmur heard. Pulmonary/Chest: Effort normal. No accessory muscle usage. Not tachypneic. No respiratory distress. He has wheezes in the right lower field. He has rhonchi in the right lower field. He has rales in the right lower field and the left lower field.  Abdominal: Soft. Bowel sounds are normal. He exhibits no distension. There is no tenderness. There is no rebound and no guarding.  Musculoskeletal: He exhibits no edema.  Neurological: He is alert and oriented to person, place, and time. No cranial nerve deficit. He exhibits normal muscle tone. Coordination normal. GCS eye subscore is 4. GCS verbal subscore is 4. GCS motor subscore is 6.  Skin: Skin is warm and dry. He is not diaphoretic.    ED Course  Procedures (including critical care time)  Labs Reviewed  CBC WITH DIFFERENTIAL - Abnormal; Notable for the following:    WBC 12.4 (*)    RDW 15.6 (*)    Platelets 140 (*)    Neutrophils Relative 83 (*)    Neutro Abs 10.3 (*)    Lymphocytes Relative 9 (*)    All other components within normal limits  BASIC METABOLIC PANEL - Abnormal; Notable for the following:    BUN 26 (*)    Creatinine, Ser 2.08 (*)    GFR calc non Af Amer 27 (*)    GFR calc Af Amer 31 (*)    All other components within normal limits  PRO B NATRIURETIC PEPTIDE - Abnormal; Notable for the following:    Pro B Natriuretic peptide (BNP) 4692.0 (*)    All other components within normal limits  URINALYSIS, ROUTINE W REFLEX MICROSCOPIC -  Abnormal; Notable for the following:    Hgb urine dipstick MODERATE (*)    Protein, ur >300 (*)    All other components within normal limits  URINE MICROSCOPIC-ADD ON - Abnormal; Notable for the following:    Casts HYALINE CASTS (*)    All other components within normal limits  GLUCOSE, CAPILLARY - Abnormal; Notable for the following:    Glucose-Capillary 149 (*)    All other components within normal limits  PROTIME-INR - Abnormal; Notable for the following:    Prothrombin Time 18.5 (*)    INR 1.59 (*)    All other components within normal limits  BASIC METABOLIC PANEL - Abnormal; Notable for the following:    Potassium 3.2 (*)  Glucose, Bld 57 (*)    BUN 27 (*)    Creatinine, Ser 2.04 (*)    GFR calc non Af Amer 28 (*)    GFR calc Af Amer 32 (*)    All other components within normal limits  CBC - Abnormal; Notable for the following:    WBC 11.0 (*)    Hemoglobin 12.6 (*)    Platelets 136 (*)    All other components within normal limits  GLUCOSE, CAPILLARY - Abnormal; Notable for the following:    Glucose-Capillary 63 (*)    All other components within normal limits  POCT I-STAT 3, BLOOD GAS (G3+) - Abnormal; Notable for the following:    pCO2 arterial 46.1 (*)    Bicarbonate 28.0 (*)    All other components within normal limits  MRSA PCR SCREENING  URINE CULTURE  GLUCOSE, CAPILLARY  HEMOGLOBIN A1C  CG4 I-STAT (LACTIC ACID)   Dg Chest 2 View  06/22/2012  *RADIOLOGY REPORT*  Clinical Data: Weakness, cough, shortness of breath, history hypertension, diabetes, CHF, coronary artery disease, stroke  CHEST - 2 VIEW  Comparison: 08/03/2011  Findings: Enlargement of cardiac silhouette post CABG. Atherosclerotic ossification aorta. Low lung volumes with mild right basilar atelectasis. New atelectasis versus consolidation left lower lobe. Question small left pleural effusion. Mild chronic central peribronchial thickening. No pneumothorax. Osseous demineralization with advanced left  glenohumeral degenerative changes.  IMPRESSION: Enlargement of cardiac silhouette with pulmonary vascular congestion post CABG. New left lower lobe atelectasis versus consolidation and mild right basilar atelectasis.   Original Report Authenticated By: Ulyses Southward, M.D.    Ct Head Wo Contrast  06/23/2012  *RADIOLOGY REPORT*  Clinical Data: Listing to right, new right tremor, confusion  CT HEAD WITHOUT CONTRAST  Technique:  Contiguous axial images were obtained from the base of the skull through the vertex without contrast.  Comparison: 12/15/2008  Findings: Generalized atrophy. Normal ventricular morphology. No midline shift or mass effect. Tiny old lacunar infarct left basal ganglia. Small vessel chronic ischemic changes of deep cerebral white matter. Streak artifacts on initial images, which repeat imaging was performed. No intracranial hemorrhage, mass lesion or evidence of acute infarction. No extra-axial fluid collections. Scattered sinus opacification of fluid. Atherosclerotic calcifications at skull base. Bones diffusely demineralized.  IMPRESSION: Atrophy with small vessel chronic ischemic changes deep cerebral white matter. Tiny old lacunar infarct left basal ganglia. No acute intracranial abnormalities. Diffuse sinus disease changes.   Original Report Authenticated By: Ulyses Southward, M.D.      1. Healthcare-associated pneumonia   2. Congestive heart failure       MDM  79 yo M with multiple medical comorbidities including CHF presents with about 3 days of non-productive cough and feeling unwell.  Confused today.  Low grade fever. Here rectal temp 101.1, mild tachycardia, mild tachypnea, normotensive; non-toxic appearing; NAD; has rales at bilateral bases and wheezes and rhonci mainly in RLL fields.  Labs pertinent for INR 1.59, normal lactate, leukocytosis to 12.4, BNP 4692, BUN 26, creatinine 2.08 (baseline about 1.7), ABG 7.392/46/82, UA negative for UTI; CXR shows atelectasis at both lung  bases.  Covered for HCAP with vancomycin and aztreonam given severe PCN allergy  Gave 80 IV lasix  Stable throughout ED course; admitted to hospitalist for inpatient management of PNA and CHF           Toney Sang, MD 06/23/12 [phone]  I performed a history and physical examination of  Margaretha Seeds and discussed his management with Dr. Peyton Najjar.  I agree with the history, physical, assessment, and plan of care, with the following exceptions: None I was present for the following procedures: None  Time Spent in Critical Care of the patient: 30 min  Time spent in discussions with the patient and family: 15 minutes  Jadalyn Oliveri  CRITICAL CARE Performed by: Derwood Kaplan   Total critical care time: 30 minutes  Critical care time was exclusive of separately billable procedures and treating other patients.  Critical care was necessary to treat or prevent imminent or life-threatening deterioration.  Critical care was time spent personally by me on the following activities: development of treatment plan with patient and/or surrogate as well as nursing, discussions with consultants, evaluation of patient's response to treatment, examination of patient, obtaining history from patient or surrogate, ordering and performing treatments and interventions, ordering and review of laboratory studies, ordering and review of radiographic studies, pulse oximetry and re-evaluation of patient's condition.   Pt comes in with some sepsis and CHF exacerbation - with the CHF component being overbearing. Pt started on ivab. Monitored closely - hemodynamics, resp status. Admitted for further evaluation.   Derwood Kaplan, MD 06/26/12 [phone]

## 2012-06-22 NOTE — Progress Notes (Signed)
ANTIBIOTIC CONSULT NOTE - INITIAL  Pharmacy Consult for Vancomycin/aztreonam Indication: rule out pneumonia  Allergies  Allergen Reactions  . Moxifloxacin Other (See Comments)    Per MAR  . Penicillins Other (See Comments)    Per [facility]    Patient Measurements: Height: 5\' 10"  (177.8 cm) Weight: 214 lb (97.07 kg) IBW/kg (Calculated) : 73  Vital Signs: Temp: 101.1 F (38.4 C) (05/09 2131) Temp src: Rectal (05/09 2131) BP: 124/76 mmHg (05/09 2200) Pulse Rate: 88 (05/09 2215) Intake/Output from previous day:   Intake/Output from this shift: Total I/O In: -  Out: 350 [Urine:350]  Labs:  Recent Labs  06/22/12 1907  WBC 12.4*  HGB 13.3  PLT 140*  CREATININE 2.08*   Estimated Creatinine Clearance: 29.2 ml/min (by C-G formula based on Cr of 2.08). No results found for this basename: VANCOTROUGH, VANCOPEAK, VANCORANDOM, GENTTROUGH, GENTPEAK, GENTRANDOM, TOBRATROUGH, TOBRAPEAK, TOBRARND, AMIKACINPEAK, AMIKACINTROU, AMIKACIN,  in the last 72 hours   Microbiology: No results found for this or any previous visit (from the past 720 hour(s)).  Medical History: Past Medical History  Diagnosis Date  . CHF (congestive heart failure)     secondary to diastolic function. Echo 4/09: EF 55%  . CAD (coronary artery disease)     status post CABG in 1996  . Paroxysmal atrial fibrillation   . Diabetes mellitus     type was not specified  . HTN (hypertension)   . Hyperlipidemia   . Chronic renal insufficiency     with a base line creatinine of 1.6  . Cerebrovascular accident     Hx of it, Left sided facial droop  . Depression   . Carotid bruit     s/p carotids in 2006, showed mild hard plaque in the bulbs and proximal bifurcations, left greater that right. Both ICAs take steep posterior dives. Right ICA 0-39% stenosis; left ICA 40-59% stenosis.  Carrotids 9/20: 0-39%  . BPH (benign prostatic hyperplasia)     Hx of it.  . S/P cholecystectomy   . Fall     C2 fracture/ 12/15/08    Assessment: 6 YOM presented with fever and productive cough to start vancomycin and aztreonam for empiric treatment (patient with penicillin allergy). Temp = 101.1, wbc 12.4, urine culture pending. Patient received vancomycin 2g in the ED at 2200. Scr elevated at 2.08 (baseline = 1.6), est. crcl ~ 25-30 ml/min.  Goal of Therapy:  Vancomycin trough level 15-20 mcg/ml  Plan:  - Vancomycin 1g IV Q 24hrs, next dose tomorrow night at 2200 - Aztreonam 1g IV Q 8 hrs as ordered - f/u cultures and renal function, check vancomycin trough at steady state.  Bayard Hugger, PharmD, BCPS  Clinical Pharmacist  Pager: [phone]   06/22/2012,10:38 PM

## 2012-06-23 ENCOUNTER — Inpatient Hospital Stay (HOSPITAL_COMMUNITY): Payer: Medicare Other

## 2012-06-23 DIAGNOSIS — I10 Essential (primary) hypertension: Secondary | ICD-10-CM

## 2012-06-23 DIAGNOSIS — E119 Type 2 diabetes mellitus without complications: Secondary | ICD-10-CM

## 2012-06-23 LAB — CBC
MCH: 26.5 pg (ref 26.0–34.0)
MCHC: 32.3 g/dL (ref 30.0–36.0)
MCV: 81.9 fL (ref 78.0–100.0)
Platelets: 136 10*3/uL — ABNORMAL LOW (ref 150–400)
RDW: 15.5 % (ref 11.5–15.5)

## 2012-06-23 LAB — GLUCOSE, CAPILLARY
Glucose-Capillary: 105 mg/dL — ABNORMAL HIGH (ref 70–99)
Glucose-Capillary: 67 mg/dL — ABNORMAL LOW (ref 70–99)
Glucose-Capillary: 89 mg/dL (ref 70–99)

## 2012-06-23 LAB — HEMOGLOBIN A1C: Hgb A1c MFr Bld: 6.6 % — ABNORMAL HIGH (ref <5.7)

## 2012-06-23 LAB — BASIC METABOLIC PANEL
CO2: 29 mEq/L (ref 19–32)
Calcium: 8.6 mg/dL (ref 8.4–10.5)
Creatinine, Ser: 2.04 mg/dL — ABNORMAL HIGH (ref 0.50–1.35)
GFR calc non Af Amer: 28 mL/min — ABNORMAL LOW (ref >90)
Glucose, Bld: 57 mg/dL — ABNORMAL LOW (ref 70–99)

## 2012-06-23 MED ORDER — HYDRALAZINE HCL 25 MG PO TABS
25.0000 mg | ORAL_TABLET | Freq: Four times a day (QID) | ORAL | Status: DC | PRN
  Filled 2012-06-23: qty 1

## 2012-06-23 MED ORDER — AMLODIPINE BESYLATE 10 MG PO TABS
10.0000 mg | ORAL_TABLET | Freq: Every day | ORAL | Status: DC
  Administered 2012-06-24: 10 mg via ORAL
  Filled 2012-06-23: qty 1

## 2012-06-23 MED ORDER — WARFARIN SODIUM 7.5 MG PO TABS
7.5000 mg | ORAL_TABLET | Freq: Once | ORAL | Status: AC
  Administered 2012-06-23: 7.5 mg via ORAL
  Filled 2012-06-23: qty 1

## 2012-06-23 MED ORDER — OXYCODONE HCL 5 MG PO TABS
5.0000 mg | ORAL_TABLET | Freq: Four times a day (QID) | ORAL | Status: DC | PRN
  Administered 2012-06-24 – 2012-06-28 (×3): 5 mg via ORAL
  Filled 2012-06-23 (×4): qty 1

## 2012-06-23 MED ORDER — WARFARIN - PHARMACIST DOSING INPATIENT: Freq: Every day | Status: DC

## 2012-06-23 MED ORDER — POTASSIUM CHLORIDE CRYS ER 20 MEQ PO TBCR
40.0000 meq | EXTENDED_RELEASE_TABLET | Freq: Once | ORAL | Status: AC
  Administered 2012-06-23: 40 meq via ORAL
  Filled 2012-06-23: qty 2

## 2012-06-23 MED ORDER — METOPROLOL TARTRATE 12.5 MG HALF TABLET
12.5000 mg | ORAL_TABLET | Freq: Two times a day (BID) | ORAL | Status: DC
  Administered 2012-06-23 – 2012-06-29 (×13): 12.5 mg via ORAL
  Filled 2012-06-23 (×14): qty 1

## 2012-06-23 MED ORDER — FUROSEMIDE 10 MG/ML IJ SOLN
40.0000 mg | Freq: Two times a day (BID) | INTRAMUSCULAR | Status: DC
  Administered 2012-06-23 – 2012-06-25 (×5): 40 mg via INTRAVENOUS
  Filled 2012-06-23 (×8): qty 4

## 2012-06-23 MED ORDER — BENAZEPRIL HCL 20 MG PO TABS
20.0000 mg | ORAL_TABLET | Freq: Every day | ORAL | Status: DC
  Administered 2012-06-23: 20 mg via ORAL
  Filled 2012-06-23: qty 1

## 2012-06-23 MED ORDER — INSULIN GLARGINE 100 UNIT/ML ~~LOC~~ SOLN: 60.0000 [IU] | Freq: Every day | SUBCUTANEOUS | Status: DC

## 2012-06-23 MED ORDER — POTASSIUM CHLORIDE CRYS ER 20 MEQ PO TBCR
20.0000 meq | EXTENDED_RELEASE_TABLET | Freq: Every day | ORAL | Status: DC
  Administered 2012-06-24 – 2012-06-29 (×6): 20 meq via ORAL
  Filled 2012-06-23 (×6): qty 1

## 2012-06-23 MED ORDER — INSULIN GLARGINE 100 UNIT/ML ~~LOC~~ SOLN
40.0000 [IU] | Freq: Two times a day (BID) | SUBCUTANEOUS | Status: DC
  Filled 2012-06-23 (×2): qty 0.4

## 2012-06-23 MED ORDER — AMLODIPINE BESYLATE 5 MG PO TABS
5.0000 mg | ORAL_TABLET | Freq: Every day | ORAL | Status: DC
  Administered 2012-06-23: 5 mg via ORAL
  Filled 2012-06-23: qty 1

## 2012-06-23 NOTE — Progress Notes (Addendum)
[facility] PROGRESS NOTE  Wayne Montoya ZOX:[id number] DOB: 07-27-1922 DOA: 06/22/2012 PCP: Junious Dresser, MD   Brief narrative; 79 y/o male with  CHF ( follows with heart failure clinic), CAD s/p CABG, paroxysmal a fib on coumadin, CVA, DM, HTN, CKD, was admitted for worsening SOB , fever and some confusion with findings of pneumonia and CHF.  Assessment/Plan:  HCAP On empiric coverage with vanco and aztreonam No blood cx sent on admission Tylenol prn for fever  chronic diastolic CHF  appears to be volume overloaded. Elevated pro BNP and pulmonary  vascular congestion on CXR  will diurese with IV lasix 40 mg bid. Received 80 mg  IV on admission. On 80 mg po daily at home. Hold ARB given renal insufficiency. No recent baseline renal fn known. Add metoprolol Monitor strict I/O and daily weight Appreciate cardiology evaluation. Will get 2D echo  CKD  no recent baseline. As per son renal functio has been worsening. Continue lasix for now and monitor. Hold ARB  DM  continue  SSI. Patient on quite high dose of lantus ( 60 units bid). Will reduce to 40 bid as fsg low this am.   check A11C   AMS  Likely due to pneumonia. Clearing as per son. Patient has been quite weak for some time.head CT negative.  PT eval requested  afib   rate controlled. On coumadin.INR sub therapeutic . Dosing per pharmacy  Hypertension  continue lasix, zaroxolyn, , increase dose of amlod. Will add low dose BB.  hypothyroidism Continue synthroid     Code Status: full  Family Communication:son at bedside Disposition Plan: PT Eval   Consultants:  Dr Clarise Cruz  Procedures:  none  Antibiotics:  IV vanco and aztreonam  HPI/Subjective: Admission H&P reviewed  Objective: Filed Vitals:   06/22/12 2215 06/22/12 2255 06/23/12 0613 06/23/12 1044  BP:  128/75 151/92 141/78  Pulse: 88 85 95 97  Temp:  100.2 F (37.9 C) 98.9 F (37.2 C) 98.3 F (36.8 C)  TempSrc:  Oral Oral Oral   Resp: 23  22 22   Height:      Weight:   94.7 kg (208 lb 12.4 oz)   SpO2: 95% 96% 95% 97%    Intake/Output Summary (Last 24 hours) at 06/23/12 1136 Last data filed at 06/23/12 0854  Gross per 24 hour  Intake    480 ml  Output   1300 ml  Net   -820 ml   Filed Weights   06/22/12 1810 06/23/12 0613  Weight: 97.07 kg (214 lb) 94.7 kg (208 lb 12.4 oz)    Exam:   General:  Elderly male lying in bed in NAD, appears fatigued  HEENT: no pallor, moist mucosa  Chest: b/l basal crackles  Cardiovascular: NS1&S2, no murmurs  Abd: soft, NT, ND, BS+  EXT: 1+ pitting edema b/l   CNS: AAOX2  Data Reviewed: Basic Metabolic Panel:  Recent Labs Lab 06/22/12 1907 06/23/12 0530  NA 139 139  K 3.9 3.2*  CL 101 101  CO2 26 29  GLUCOSE 85 57*  BUN 26* 27*  CREATININE 2.08* 2.04*  CALCIUM 9.0 8.6   Liver Function Tests: No results found for this basename: AST, ALT, ALKPHOS, BILITOT, PROT, ALBUMIN,  in the last 168 hours No results found for this basename: LIPASE, AMYLASE,  in the last 168 hours No results found for this basename: AMMONIA,  in the last 168 hours CBC:  Recent Labs Lab 06/22/12 1907 06/23/12 0530  WBC 12.4* 11.0*  NEUTROABS 10.3*  --   HGB 13.3 12.6*  HCT 40.6 39.0  MCV 81.9 81.9  PLT 140* 136*   Cardiac Enzymes: No results found for this basename: CKTOTAL, CKMB, CKMBINDEX, TROPONINI,  in the last 168 hours BNP (last 3 results)  Recent Labs  06/22/12 1907  PROBNP 4692.0*   CBG:  Recent Labs Lab 06/22/12 2343 06/23/12 0603 06/23/12 0626 06/23/12 1122  GLUCAP 149* 63* 81 113*    Recent Results (from the past 240 hour(s))  MRSA PCR SCREENING     Status: None   Collection Time    06/22/12 11:04 PM      Result Value Range Status   MRSA by PCR NEGATIVE  NEGATIVE Final   Comment:            The GeneXpert MRSA Assay (FDA     approved for NASAL specimens     only), is one component of a     comprehensive MRSA colonization     surveillance  program. It is not     intended to diagnose MRSA     infection nor to guide or     monitor treatment for     MRSA infections.     Studies: Dg Chest 2 View  06/22/2012  *RADIOLOGY REPORT*  Clinical Data: Weakness, cough, shortness of breath, history hypertension, diabetes, CHF, coronary artery disease, stroke  CHEST - 2 VIEW  Comparison: 08/03/2011  Findings: Enlargement of cardiac silhouette post CABG. Atherosclerotic ossification aorta. Low lung volumes with mild right basilar atelectasis. New atelectasis versus consolidation left lower lobe. Question small left pleural effusion. Mild chronic central peribronchial thickening. No pneumothorax. Osseous demineralization with advanced left glenohumeral degenerative changes.  IMPRESSION: Enlargement of cardiac silhouette with pulmonary vascular congestion post CABG. New left lower lobe atelectasis versus consolidation and mild right basilar atelectasis.   Original Report Authenticated By: Ulyses Southward, M.D.    Ct Head Wo Contrast  06/23/2012  *RADIOLOGY REPORT*  Clinical Data: Listing to right, new right tremor, confusion  CT HEAD WITHOUT CONTRAST  Technique:  Contiguous axial images were obtained from the base of the skull through the vertex without contrast.  Comparison: 12/15/2008  Findings: Generalized atrophy. Normal ventricular morphology. No midline shift or mass effect. Tiny old lacunar infarct left basal ganglia. Small vessel chronic ischemic changes of deep cerebral white matter. Streak artifacts on initial images, which repeat imaging was performed. No intracranial hemorrhage, mass lesion or evidence of acute infarction. No extra-axial fluid collections. Scattered sinus opacification of fluid. Atherosclerotic calcifications at skull base. Bones diffusely demineralized.  IMPRESSION: Atrophy with small vessel chronic ischemic changes deep cerebral white matter. Tiny old lacunar infarct left basal ganglia. No acute intracranial abnormalities. Diffuse  sinus disease changes.   Original Report Authenticated By: Ulyses Southward, M.D.     Scheduled Meds: . amLODipine  5 mg Oral Daily  . atorvastatin  80 mg Oral Daily  . aztreonam  1 g Intravenous Q8H  . benazepril  20 mg Oral Daily  . furosemide  80 mg Oral Daily  . insulin aspart  0-5 Units Subcutaneous QHS  . insulin aspart  0-9 Units Subcutaneous TID WC  . insulin glargine  60 Units Subcutaneous BID  . levothyroxine  50 mcg Oral QAC breakfast  . multivitamin  1 capsule Oral Daily  . pantoprazole  40 mg Oral Daily  . [START ON 06/24/2012] potassium chloride SA  20 mEq Oral Daily  . vancomycin  1,000 mg Intravenous Q24H  . warfarin  7.5 mg Oral ONCE-1800  . Warfarin - Pharmacist Dosing Inpatient   Does not apply q1800   Continuous Infusions: . sodium chloride 10 mL/hr at 06/22/12 2354      Time spent: 25 minutes    Trenisha Lafavor  [facility] Pager [phone] If 7PM-7AM, please contact night-coverage at [email or web address], password [facility] 06/23/2012, 11:36 AM  LOS: 1 day

## 2012-06-23 NOTE — Progress Notes (Signed)
ANTICOAGULATION CONSULT NOTE - Initial Consult  Pharmacy Consult for Coumadin Indication: atrial fibrillation  Allergies  Allergen Reactions  . Heparin Other (See Comments)    HIT  . Moxifloxacin Other (See Comments)    Per MAR  . Penicillins Other (See Comments)    Per [facility]    Patient Measurements: Height: 5\' 10"  (177.8 cm) Weight: 208 lb 12.4 oz (94.7 kg) IBW/kg (Calculated) : 73  Vital Signs: Temp: 98.9 F (37.2 C) (05/10 0613) Temp src: Oral (05/10 0613) BP: 151/92 mmHg (05/10 0613) Pulse Rate: 95 (05/10 0613)  Labs:  Recent Labs  06/22/12 1907 06/23/12 0530  HGB 13.3 12.6*  HCT 40.6 39.0  PLT 140* 136*  LABPROT  --  18.5*  INR  --  1.59*  CREATININE 2.08* 2.04*    Estimated Creatinine Clearance: 29.5 ml/min (by C-G formula based on Cr of 2.04).   Medical History: Past Medical History  Diagnosis Date  . CHF (congestive heart failure)     secondary to diastolic function. Echo 4/09: EF 55%  . CAD (coronary artery disease)     status post CABG in 1996  . Paroxysmal atrial fibrillation   . Diabetes mellitus     type was not specified  . HTN (hypertension)   . Hyperlipidemia   . Chronic renal insufficiency     with a base line creatinine of 1.6  . Cerebrovascular accident     Hx of it, Left sided facial droop  . Depression   . Carotid bruit     s/p carotids in 2006, showed mild hard plaque in the bulbs and proximal bifurcations, left greater that right. Both ICAs take steep posterior dives. Right ICA 0-39% stenosis; left ICA 40-59% stenosis.  Carrotids 9/20: 0-39%  . BPH (benign prostatic hyperplasia)     Hx of it.  . S/P cholecystectomy   . Fall     C2 fracture/ 12/15/08  . Dementia     Assessment: 9 YOM with hx of afib on coumadin PTA, pharmacy is consulted to dose coumadin. INR 1.59 this morning. PTA dose 5mg /7.5mg  alternating every other day. Last dose was 7.5mg  on 5/8, hgb 12.9, plt 139  Goal of Therapy:  INR 2-3 Monitor platelets by  anticoagulation protocol: Yes   Plan:  - Coumadin 7.5mg  po x 1 - f/u daily PT/INR  Bayard Hugger, PharmD, BCPS  Clinical Pharmacist  Pager: [phone]   06/23/2012,7:48 AM

## 2012-06-23 NOTE — Progress Notes (Signed)
Hypoglycemic Event  CBG: 63  Treatment: 1 cup orange juice  Symptoms: none  Follow-up CBG: Time:0626 CBG Result:81  Possible Reasons for Event: lantus 60 units given last night  Comments/MD notified:T. Claiborne Billings NP    Wayne Montoya  Remember to initiate Hypoglycemia Order Set & complete

## 2012-06-23 NOTE — Progress Notes (Signed)
Hypoglycemic Event  CBG: 65  Treatment: 1 cup orange juice  Symptoms: none  Follow-up CBG: Time:2116 CBG Result:89  Possible Reasons for Event: poor appetite  Comments/MD notified:T. Claiborne Billings NP, hold lantus tonight    Venita Sheffield  Remember to initiate Hypoglycemia Order Set & complete

## 2012-06-23 NOTE — Progress Notes (Signed)
  Courtesy note: Patient well know to me from clinic.  Admitted with probable PNA and mild associated HF. CXR reviewed and has significant airspace disease in LLL and lingula. Agree with abx. BNP up significantly. Would diurese as renal function and BP tolerate.  If CXR not clearing may be worth considering non-contrast chest CT to further evaluate.  Appreciate IM care.  Daniel Bensimhon,MD 10:13 AM

## 2012-06-23 NOTE — Progress Notes (Signed)
Admitted pt to rm [id number] from ED via stretcher, oriented to room, call bell placed within reach, pt oriented only to self, bed alarm on. Admission assessment done, orders carried out. Will continue to monitor.

## 2012-06-24 LAB — CBC
MCH: 26.8 pg (ref 26.0–34.0)
Platelets: 145 10*3/uL — ABNORMAL LOW (ref 150–400)
RBC: 4.71 MIL/uL (ref 4.22–5.81)
WBC: 8.6 10*3/uL (ref 4.0–10.5)

## 2012-06-24 LAB — BASIC METABOLIC PANEL
Calcium: 9 mg/dL (ref 8.4–10.5)
GFR calc Af Amer: 31 mL/min — ABNORMAL LOW (ref >90)
GFR calc non Af Amer: 27 mL/min — ABNORMAL LOW (ref >90)
Glucose, Bld: 77 mg/dL (ref 70–99)
Sodium: 138 mEq/L (ref 135–145)

## 2012-06-24 LAB — PROTIME-INR
INR: 1.51 — ABNORMAL HIGH (ref 0.00–1.49)
Prothrombin Time: 17.8 seconds — ABNORMAL HIGH (ref 11.6–15.2)

## 2012-06-24 LAB — GLUCOSE, CAPILLARY: Glucose-Capillary: 106 mg/dL — ABNORMAL HIGH (ref 70–99)

## 2012-06-24 LAB — URINE CULTURE

## 2012-06-24 MED ORDER — WARFARIN SODIUM 10 MG PO TABS
10.0000 mg | ORAL_TABLET | Freq: Once | ORAL | Status: AC
  Administered 2012-06-24: 10 mg via ORAL
  Filled 2012-06-24: qty 1

## 2012-06-24 MED ORDER — ISOSORBIDE MONONITRATE ER 30 MG PO TB24
30.0000 mg | ORAL_TABLET | Freq: Every day | ORAL | Status: DC
  Administered 2012-06-24 – 2012-06-29 (×6): 30 mg via ORAL
  Filled 2012-06-24 (×6): qty 1

## 2012-06-24 MED ORDER — HYDRALAZINE HCL 25 MG PO TABS
25.0000 mg | ORAL_TABLET | Freq: Three times a day (TID) | ORAL | Status: DC
  Administered 2012-06-24 – 2012-06-29 (×15): 25 mg via ORAL
  Filled 2012-06-24 (×17): qty 1

## 2012-06-24 NOTE — Progress Notes (Addendum)
ANTICOAGULATION CONSULT NOTE - Follow Up Consult  Pharmacy Consult for Coumadin Indication: atrial fibrillation  Allergies  Allergen Reactions  . Heparin Other (See Comments)    HIT  . Moxifloxacin Other (See Comments)    Per MAR  . Penicillins Other (See Comments)    Per [facility]    Patient Measurements: Height: 5\' 10"  (177.8 cm) Weight: 210 lb 5.1 oz (95.4 kg) (bed scale) IBW/kg (Calculated) : 73  Vital Signs: Temp: 97.9 F (36.6 C) (05/11 0456) Temp src: Oral (05/11 0456) BP: 132/79 mmHg (05/11 0456) Pulse Rate: 56 (05/11 0456)  Labs:  Recent Labs  06/22/12 1907 06/23/12 0530 06/24/12 0434  HGB 13.3 12.6* 12.6*  HCT 40.6 39.0 39.4  PLT 140* 136* 145*  LABPROT  --  18.5* 17.8*  INR  --  1.59* 1.51*  CREATININE 2.08* 2.04* 2.08*    Estimated Creatinine Clearance: 29 ml/min (by C-G formula based on Cr of 2.08).   Medical History: Past Medical History  Diagnosis Date  . CHF (congestive heart failure)     secondary to diastolic function. Echo 4/09: EF 55%  . CAD (coronary artery disease)     status post CABG in 1996  . Paroxysmal atrial fibrillation   . Diabetes mellitus     type was not specified  . HTN (hypertension)   . Hyperlipidemia   . Chronic renal insufficiency     with a base line creatinine of 1.6  . Cerebrovascular accident     Hx of it, Left sided facial droop  . Depression   . Carotid bruit     s/p carotids in 2006, showed mild hard plaque in the bulbs and proximal bifurcations, left greater that right. Both ICAs take steep posterior dives. Right ICA 0-39% stenosis; left ICA 40-59% stenosis.  Carrotids 9/20: 0-39%  . BPH (benign prostatic hyperplasia)     Hx of it.  . S/P cholecystectomy   . Fall     C2 fracture/ 12/15/08  . Dementia     Assessment: 31 YOM with hx of afib on coumadin PTA, pharmacy is consulted to dose coumadin. INR 1.51 this morning. PTA dose 5mg /7.5mg  alternating every other day. Patient had a missed dose prior to coming  that accounts for her subtherapeutic level. No bleeding noted.  Goal of Therapy:  INR 2-3 Monitor platelets by anticoagulation protocol: Yes   Plan:  - Coumadin 10 mg po x 1 - f/u daily PT/INR  Vania Rea. Darin Engels.D. Clinical Pharmacist Pager [phone] Phone [phone] 06/24/2012 9:36 AM

## 2012-06-24 NOTE — Progress Notes (Addendum)
[facility] PROGRESS NOTE  Wayne Montoya YNW:[id number] DOB: 1922-08-07 DOA: 06/22/2012 PCP: Junious Dresser, MD  Brief narrative;  79 y/o male with CHF ( follows with heart failure clinic), CAD s/p CABG, paroxysmal a fib on coumadin, CVA, DM, HTN, CKD, was admitted for worsening SOB , fever and some confusion with findings of pneumonia and CHF.   Assessment/Plan:  HCAP  On empiric coverage with vanco and aztreonam  No blood cx sent on admission  Tylenol prn for fever   chronic diastolic CHF . Elevated pro BNP and pulmonary vascular congestion on CXR   diuresing with IV lasix 40 mg bid. On 80 mg po daily at home.  Hold ARB given renal insufficiency. No recent baseline renal fn known. Add metoprolol  Monitor strict I/O and daily weight  Appreciate cardiology evaluation.   2D echo results show severely reduced EF of 30-35%. Comments on possible vegetation on aortic valve. Will discuss with cardiology. Add imdur and  hydralazine  CKD ( possibly stage 3) no recent baseline. As per son renal functio has been worsening. Continue lasix for now and monitor. Hold ARB   DM  continue SSI. Patient on quite high dose of lantus ( 60 units bid). Will reduce to 40 bid but FSG still low this am.  A1C 6.6. Will hold lantus for now and continue only SSI.  AMS  Likely due to pneumonia. Clearing as per son. Patient has been quite weak for some time.head CT negative. PT eval requested   afib  rate controlled. On coumadin.INR sub therapeutic . Dosing per pharmacy   Hypertension  continue lasix, zaroxolyn, , increase dose of amlod. added low dose BB.   hypothyroidism  Continue synthroid   Code Status: DNR Family Communication:discussed with son  Disposition Plan: PT Eval  Consultants:  Dr Clarise Cruz Procedures:  none Antibiotics:  IV vanco and aztreonam HPI/Subjective:  Admission H&P reviewed   Objective: Filed Vitals:   06/24/12 0456 06/24/12 0822 06/24/12 1000 06/24/12 1341   BP: 132/79  140/56 128/72  Pulse: 56  62 74  Temp: 97.9 F (36.6 C)   98.2 F (36.8 C)  TempSrc: Oral   Oral  Resp: 20   20  Height:      Weight: 95.4 kg (210 lb 5.1 oz)     SpO2: 99% 98%  100%    Intake/Output Summary (Last 24 hours) at 06/24/12 1737 Last data filed at 06/24/12 1256  Gross per 24 hour  Intake    780 ml  Output   1350 ml  Net   -570 ml   Filed Weights   06/22/12 1810 06/23/12 0613 06/24/12 0456  Weight: 97.07 kg (214 lb) 94.7 kg (208 lb 12.4 oz) 95.4 kg (210 lb 5.1 oz)    Exam:  General: Elderly male lying in bed in NAD, appears fatigued  HEENT: no pallor, moist mucosa  Chest: b/l basal crackles , improved today Cardiovascular: NS1&S2, no murmurs  Abd: soft, NT, ND, BS+  EXT: 1+ pitting edema b/l  CNS: AAOX2  Data Reviewed: Basic Metabolic Panel:  Recent Labs Lab 06/22/12 1907 06/23/12 0530 06/24/12 0434  NA 139 139 138  K 3.9 3.2* 3.5  CL 101 101 98  CO2 26 29 27   GLUCOSE 85 57* 77  BUN 26* 27* 32*  CREATININE 2.08* 2.04* 2.08*  CALCIUM 9.0 8.6 9.0   Liver Function Tests: No results found for this basename: AST, ALT, ALKPHOS, BILITOT, PROT, ALBUMIN,  in the last 168 hours No results  found for this basename: LIPASE, AMYLASE,  in the last 168 hours No results found for this basename: AMMONIA,  in the last 168 hours CBC:  Recent Labs Lab 06/22/12 1907 06/23/12 0530 06/24/12 0434  WBC 12.4* 11.0* 8.6  NEUTROABS 10.3*  --   --   HGB 13.3 12.6* 12.6*  HCT 40.6 39.0 39.4  MCV 81.9 81.9 83.7  PLT 140* 136* 145*   Cardiac Enzymes: No results found for this basename: CKTOTAL, CKMB, CKMBINDEX, TROPONINI,  in the last 168 hours BNP (last 3 results)  Recent Labs  06/22/12 1907  PROBNP 4692.0*   CBG:  Recent Labs Lab 06/23/12 1122 06/23/12 1623 06/23/12 2114 06/23/12 2138 06/24/12 1038  GLUCAP 113* 105* 67* 89 100*    Recent Results (from the past 240 hour(s))  URINE CULTURE     Status: None   Collection Time     06/22/12  9:39 PM      Result Value Range Status   Specimen Description URINE, CLEAN CATCH   Final   Special Requests NONE   Final   Culture  Setup Time 06/22/2012 23:25   Final   Colony Count NO GROWTH   Final   Culture NO GROWTH   Final   Report Status 06/24/2012 FINAL   Final  MRSA PCR SCREENING     Status: None   Collection Time    06/22/12 11:04 PM      Result Value Range Status   MRSA by PCR NEGATIVE  NEGATIVE Final   Comment:            The GeneXpert MRSA Assay (FDA     approved for NASAL specimens     only), is one component of a     comprehensive MRSA colonization     surveillance program. It is not     intended to diagnose MRSA     infection nor to guide or     monitor treatment for     MRSA infections.     Studies: Dg Chest 2 View  06/22/2012  *RADIOLOGY REPORT*  Clinical Data: Weakness, cough, shortness of breath, history hypertension, diabetes, CHF, coronary artery disease, stroke  CHEST - 2 VIEW  Comparison: 08/03/2011  Findings: Enlargement of cardiac silhouette post CABG. Atherosclerotic ossification aorta. Low lung volumes with mild right basilar atelectasis. New atelectasis versus consolidation left lower lobe. Question small left pleural effusion. Mild chronic central peribronchial thickening. No pneumothorax. Osseous demineralization with advanced left glenohumeral degenerative changes.  IMPRESSION: Enlargement of cardiac silhouette with pulmonary vascular congestion post CABG. New left lower lobe atelectasis versus consolidation and mild right basilar atelectasis.   Original Report Authenticated By: Ulyses Southward, M.D.    Ct Head Wo Contrast  06/23/2012  *RADIOLOGY REPORT*  Clinical Data: Listing to right, new right tremor, confusion  CT HEAD WITHOUT CONTRAST  Technique:  Contiguous axial images were obtained from the base of the skull through the vertex without contrast.  Comparison: 12/15/2008  Findings: Generalized atrophy. Normal ventricular morphology. No midline  shift or mass effect. Tiny old lacunar infarct left basal ganglia. Small vessel chronic ischemic changes of deep cerebral white matter. Streak artifacts on initial images, which repeat imaging was performed. No intracranial hemorrhage, mass lesion or evidence of acute infarction. No extra-axial fluid collections. Scattered sinus opacification of fluid. Atherosclerotic calcifications at skull base. Bones diffusely demineralized.  IMPRESSION: Atrophy with small vessel chronic ischemic changes deep cerebral white matter. Tiny old lacunar infarct left basal ganglia. No acute intracranial  abnormalities. Diffuse sinus disease changes.   Original Report Authenticated By: Ulyses Southward, M.D.     Scheduled Meds: . amLODipine  10 mg Oral Daily  . atorvastatin  80 mg Oral Daily  . aztreonam  1 g Intravenous Q8H  . furosemide  40 mg Intravenous BID  . insulin aspart  0-5 Units Subcutaneous QHS  . insulin aspart  0-9 Units Subcutaneous TID WC  . levothyroxine  50 mcg Oral QAC breakfast  . metoprolol tartrate  12.5 mg Oral BID  . multivitamin  1 capsule Oral Daily  . pantoprazole  40 mg Oral Daily  . potassium chloride SA  20 mEq Oral Daily  . vancomycin  1,000 mg Intravenous Q24H  . Warfarin - Pharmacist Dosing Inpatient   Does not apply q1800   Continuous Infusions: . sodium chloride 10 mL/hr at 06/22/12 2354      Time spent: 25 minutes    Carron Jaggi  [facility] Pager [phone] If 7PM-7AM, please contact night-coverage at [email or web address], password [facility] 06/24/2012, 5:37 PM  LOS: 2 days

## 2012-06-24 NOTE — Evaluation (Signed)
Physical Therapy Evaluation Patient Details Name: Wayne Montoya MRN: [id number] DOB: 09-02-1922 Today's Date: 06/24/2012 Time: 7829-5621 PT Time Calculation (min): 25 min  PT Assessment / Plan / Recommendation Clinical Impression  Pt is an 79 y/o male admitted for pneumonia and CHF.  Pt will benefit from skilled acute PT services to increase strength and mobility and increase functional independence.    PT Assessment  Patient needs continued PT services    Follow Up Recommendations  SNF    Does the patient have the potential to tolerate intense rehabilitation      Barriers to Discharge None      Equipment Recommendations  None recommended by PT    Recommendations for Other Services     Frequency Min 3X/week    Precautions / Restrictions Precautions Precautions: Fall Restrictions Weight Bearing Restrictions: No   Pertinent Vitals/Pain no c/o pain.  Pt on 2L O2, spO2 98% throughout session      Mobility  Bed Mobility Supine to Sit: 4: Min assist;HOB flat Details for Bed Mobility Assistance: requires lifting assist at trunk Transfers Sit to Stand: 4: Min assist Stand Pivot Transfers: 4: Min assist Details for Transfer Assistance: min A with RW, pt requires cues for safe hand placement Ambulation/Gait Ambulation/Gait Assistance: 4: Min assist Ambulation Distance (Feet): 15 Feet Assistive device: Rolling walker Ambulation/Gait Assistance Details: small step length, decreased cadence, steadying assist at trunk due to small LOB unable to self correct Stairs: No Wheelchair Mobility Wheelchair Mobility: No    Exercises     PT Diagnosis: Generalized weakness;Difficulty walking  PT Problem List: Decreased strength;Decreased activity tolerance;Decreased balance;Decreased mobility;Cardiopulmonary status limiting activity;Decreased safety awareness PT Treatment Interventions: Gait training;DME instruction;Patient/family education;Functional mobility training;Wheelchair  mobility training;Therapeutic activities;Therapeutic exercise;Balance training;Neuromuscular re-education   PT Goals Acute Rehab PT Goals PT Goal Formulation: With patient Time For Goal Achievement: 07/01/12 Potential to Achieve Goals: Good Pt will go Supine/Side to Sit: with supervision Pt will Transfer Bed to Chair/Chair to Bed: with supervision Pt will Ambulate: 51 - 150 feet;with supervision  Visit Information  Last PT Received On: 06/24/12 Assistance Needed: +1    Subjective Data  Subjective: I'll do what I can Patient Stated Goal: get out of bed   Prior Functioning  Home Living Lives With: Other (Comment) Available Help at Discharge: Skilled Nursing Facility Type of Home: Skilled Nursing Facility Home Access: Level entry Home Layout: One level Prior Function Level of Independence: Independent with assistive device(s)    Cognition  Cognition Overall Cognitive Status: No family/caregiver present to determine baseline cognitive functioning    Extremity/Trunk Assessment Right Lower Extremity Assessment RLE ROM/Strength/Tone: Within functional levels Trunk Assessment Trunk Assessment: Normal   Balance Dynamic Standing Balance Dynamic Standing - Balance Support: Bilateral upper extremity supported;During functional activity Dynamic Standing - Level of Assistance: 4: Min assist  End of Session PT - End of Session Equipment Utilized During Treatment: Gait belt;Oxygen Activity Tolerance: Patient limited by fatigue Patient left: in chair;with call bell/phone within reach;with chair alarm set;with nursing in room Nurse Communication: Mobility status  GP     Laney Bagshaw 06/24/2012, 8:28 AM

## 2012-06-24 NOTE — Progress Notes (Signed)
  Echocardiogram 2D Echocardiogram has been performed.  Wayne Montoya 06/24/2012, 12:22 PM

## 2012-06-25 ENCOUNTER — Inpatient Hospital Stay (HOSPITAL_COMMUNITY): Payer: Medicare Other

## 2012-06-25 DIAGNOSIS — G459 Transient cerebral ischemic attack, unspecified: Secondary | ICD-10-CM

## 2012-06-25 DIAGNOSIS — I679 Cerebrovascular disease, unspecified: Secondary | ICD-10-CM

## 2012-06-25 DIAGNOSIS — J189 Pneumonia, unspecified organism: Principal | ICD-10-CM

## 2012-06-25 DIAGNOSIS — I5032 Chronic diastolic (congestive) heart failure: Secondary | ICD-10-CM

## 2012-06-25 DIAGNOSIS — J90 Pleural effusion, not elsewhere classified: Secondary | ICD-10-CM

## 2012-06-25 DIAGNOSIS — I5021 Acute systolic (congestive) heart failure: Secondary | ICD-10-CM

## 2012-06-25 DIAGNOSIS — I509 Heart failure, unspecified: Secondary | ICD-10-CM

## 2012-06-25 LAB — GLUCOSE, CAPILLARY
Glucose-Capillary: 101 mg/dL — ABNORMAL HIGH (ref 70–99)
Glucose-Capillary: 127 mg/dL — ABNORMAL HIGH (ref 70–99)

## 2012-06-25 LAB — PROTIME-INR
INR: 1.78 — ABNORMAL HIGH (ref 0.00–1.49)
Prothrombin Time: 20.1 seconds — ABNORMAL HIGH (ref 11.6–15.2)

## 2012-06-25 LAB — COMPREHENSIVE METABOLIC PANEL
Albumin: 3 g/dL — ABNORMAL LOW (ref 3.5–5.2)
Alkaline Phosphatase: 66 U/L (ref 39–117)
BUN: 34 mg/dL — ABNORMAL HIGH (ref 6–23)
Chloride: 98 mEq/L (ref 96–112)
Creatinine, Ser: 1.86 mg/dL — ABNORMAL HIGH (ref 0.50–1.35)
GFR calc Af Amer: 36 mL/min — ABNORMAL LOW (ref >90)
GFR calc non Af Amer: 31 mL/min — ABNORMAL LOW (ref >90)
Glucose, Bld: 94 mg/dL (ref 70–99)
Potassium: 3.7 mEq/L (ref 3.5–5.1)
Total Bilirubin: 0.6 mg/dL (ref 0.3–1.2)

## 2012-06-25 LAB — LACTATE DEHYDROGENASE: LDH: 338 U/L — ABNORMAL HIGH (ref 94–250)

## 2012-06-25 MED ORDER — WARFARIN SODIUM 7.5 MG PO TABS
7.5000 mg | ORAL_TABLET | ORAL | Status: DC
  Filled 2012-06-25: qty 1

## 2012-06-25 MED ORDER — FUROSEMIDE 10 MG/ML IJ SOLN
40.0000 mg | Freq: Once | INTRAMUSCULAR | Status: AC
  Administered 2012-06-25: 40 mg via INTRAVENOUS

## 2012-06-25 MED ORDER — ADULT MULTIVITAMIN W/MINERALS CH
1.0000 | ORAL_TABLET | Freq: Every day | ORAL | Status: DC
  Administered 2012-06-25 – 2012-06-29 (×5): 1 via ORAL
  Filled 2012-06-25 (×5): qty 1

## 2012-06-25 MED ORDER — QUETIAPINE FUMARATE 25 MG PO TABS
25.0000 mg | ORAL_TABLET | Freq: Every day | ORAL | Status: DC
  Administered 2012-06-25 – 2012-06-28 (×4): 25 mg via ORAL
  Filled 2012-06-25 (×5): qty 1

## 2012-06-25 MED ORDER — FUROSEMIDE 10 MG/ML IJ SOLN
80.0000 mg | Freq: Two times a day (BID) | INTRAMUSCULAR | Status: AC
  Administered 2012-06-25: 80 mg via INTRAVENOUS
  Filled 2012-06-25: qty 4

## 2012-06-25 MED ORDER — ASPIRIN EC 325 MG PO TBEC
325.0000 mg | DELAYED_RELEASE_TABLET | Freq: Every day | ORAL | Status: DC
  Administered 2012-06-25 – 2012-06-27 (×3): 325 mg via ORAL
  Filled 2012-06-25 (×4): qty 1

## 2012-06-25 MED ORDER — SENNOSIDES-DOCUSATE SODIUM 8.6-50 MG PO TABS
1.0000 | ORAL_TABLET | Freq: Two times a day (BID) | ORAL | Status: DC
  Administered 2012-06-25 – 2012-06-29 (×8): 1 via ORAL
  Filled 2012-06-25 (×11): qty 1

## 2012-06-25 MED ORDER — WARFARIN SODIUM 5 MG PO TABS: 5.0000 mg | ORAL_TABLET | ORAL | Status: DC

## 2012-06-25 NOTE — Clinical Documentation Improvement (Signed)
CHF DOCUMENTATION CLARIFICATION QUERY  THIS DOCUMENT IS NOT A PERMANENT PART OF THE MEDICAL RECORD  TO RESPOND TO THE THIS QUERY, FOLLOW THE INSTRUCTIONS BELOW:  1. If needed, update documentation for the patient's encounter via the notes activity.  2. Access this query again and click edit on the In [employer].  3. After updating, or not, click F2 to complete all highlighted (required) fields concerning your review. Select "additional documentation in the medical record" OR "no additional documentation provided".  4. Click Sign note button.  5. The deficiency will fall out of your In Basket *Please let us know if you are not able to complete this workflow by phone or e-mail (listed below).  Please update your documentation within the medical record to reflect your response to this query.                                                                                    06/25/12  Dear Dr. Gonzella Lex,   In a better effort to capture your patient's severity of illness, reflect appropriate length of stay and utilization of resources, a review of the patient medical record has revealed the following indicators the diagnosis of Heart Failure.   PLEASE CLARIFY ACUITY OF CHF BASED ON CLINICAL INDICATORS BELOW TO BETTER DESCRIBE PATIENT'S SEVERITY OF ILLNESS AND RISK OF MORTALITY.  THANK YOU.   Possible Clinical Conditions? - Chronic Systolic Congestive Heart Failure - Chronic Diastolic Congestive Heart Failure - Chronic Systolic & Diastolic Congestive Heart Failure - Acute Systolic Congestive Heart Failure - Acute Diastolic Congestive Heart Failure - Acute Systolic & Diastolic Congestive Heart Failure - Acute on Chronic Systolic Congestive Heart Failure - Acute on Chronic Diastolic Congestive Heart Failure - Acute on Chronic Systolic & Diastolic  Congestive Heart Failure - Other Condition (please specify)   Supporting Information: - Risk Factors: hx Chronic Diastolic CHF, HCAP on admit -  Signs & Symptoms:  SOB, "volume overloaded" - Diagnostics: BNP: 4692, EF: 30-35%, CXR: pulmonary vascular congestion, Left pleural effusion, ECHO: new Systolic CHF Treatment: 5/12: Lasix 40mg  IV x1, then Lasix 80mg  IV bid  Reviewed: additional documentation in the medical record  Thank You,  Beverley Fiedler RN BSN Clinical Documentation Specialist: Tele Contact:  [phone]  Health Information Management [place]

## 2012-06-25 NOTE — Clinical Documentation Improvement (Signed)
GENERIC DOCUMENTATION CLARIFICATION QUERY  THIS DOCUMENT IS NOT A PERMANENT PART OF THE MEDICAL RECORD  TO RESPOND TO THE THIS QUERY, FOLLOW THE INSTRUCTIONS BELOW:  1. If needed, update documentation for the patient's encounter via the notes activity.  2. Access this query again and click edit on the In [employer].  3. After updating, or not, click F2 to complete all highlighted (required) fields concerning your review. Select "additional documentation in the medical record" OR "no additional documentation provided".  4. Click Sign note button.  5. The deficiency will fall out of your In Basket *Please let us know if you are not able to complete this workflow by phone or e-mail (listed below).  Please update your documentation within the medical record to reflect your response to this query.                                                                                        06/25/12   Dear Dr. Gonzella Lex,   In a better effort to capture your patient's severity of illness, reflect appropriate length of stay and utilization of resources, a review of the patient medical record has revealed the following indicators.    PLEASE CLARIFY DIAGNOSIS IN NOTES AND DC SUMMARY...  - Per Consult note patient has Acute Systolic CHF. If you agree please add to documentation to clarify patient's Severity of Illness and Risk of Mortality.  Thank you.    You may use possible, probable, or suspect with inpatient documentation. possible, probable, suspected diagnoses MUST be documented at the time of discharge  Reviewed: additional documentation in the medical record  Thank You,  Beverley Fiedler RN BSN  Clinical Documentation Specialist: Tele Contact:  [phone]  Health Information Management [place]

## 2012-06-25 NOTE — Consult Note (Addendum)
Advanced Heart Failure Team Consult Note  Referring Physician: Dr. Gonzella Lex Primary Physician: Dr. Orvan Falconer Primary Cardiologist: Dr. [name]   Reason for Consultation: New systolic HF  HPI:    Wayne Montoya is a 79 year old white male patient with atrial fibrillation, HIT, HTN, previous CVA, PUD, CRI (1.5-2.0) and CHF secondary to diastolic dysfunction. He has CAD and is s/p CABG in 1999. 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   He was admitted to [facility] for worsening SOB, fever and confusion.  He was diagnosed with HCAP and started on IV vanc and aztreonam.  CXR showed atelectasis vs consolidation as well as pulmonary vascular congestion with left pleuarl effusion.  Pertinent labs on admit included: proBNP 4692, Cr 2.08, BUN 26, WBC 12.4.  Repeat echo showed new systolic HF with EF 30-35%, mod LVH with moderate concentric hypertrophy.  LA mod dilated.  RA mildly dilated.    The heart failure team has been asked to evaluated for new systolic heart failure and left sided pleural effusion.   Remains a bit confused. Denies dyspnea. Still coughing up thick green sputum. No fevers, orthopnea or PND. I/Os even over night. Weight up 2 pounds. BMET pending.  CXR with persistent large L effusion.    Review of Systems: [y] = yes, [ ]  = no   General: Weight gain [ ] ; Weight loss [ ] ; Anorexia [ ] ; Fatigue [ ] ; Fever Cove.Etienne ]; Chills [ ] ; Weakness Cove.Etienne ]  Cardiac: Chest pain/pressure [ ] ; Resting SOB [ ] ; Exertional SOB Cove.Etienne ]; Orthopnea [ ] ; Pedal Edema [ ] ; Palpitations [ ] ; Syncope [ ] ; Presyncope [ ] ; Paroxysmal nocturnal dyspnea[ ]   Pulmonary: Cough [ ] ; Wheezing[ ] ; Hemoptysis[ ] ; Sputum [ y]; Snoring [ ]   GI: Vomiting[ ] ; Dysphagia[ ] ; Melena[ ] ; Hematochezia [ ] ; Heartburn[ ] ; Abdominal pain [ ] ; Constipation [ ] ; Diarrhea [ ] ; BRBPR [ ]   GU: Hematuria[ ] ; Dysuria [ ] ; Nocturia[ ]   Vascular: Pain in legs with walking [ ] ; Pain in feet with lying flat [ ] ;  Non-healing sores [ ] ; Stroke [ ] ; TIA [ ] ; Slurred speech [ ] ;  Neuro: Headaches[ ] ; Vertigo[ ] ; Seizures[ ] ; Paresthesias[ ] ;Blurred vision [ ] ; Diplopia [ ] ; Vision changes [ ]   Ortho/Skin: Arthritis [ ] ; Joint pain [ ] ; Muscle pain [ ] ; Joint swelling [ ] ; Back Pain [ ] ; Rash [ ]   Psych: Depression[ ] ; Anxiety[ ]   Heme: Bleeding problems [ ] ; Clotting disorders [ ] ; Anemia [ ]   Endocrine: Diabetes [ ] ; Thyroid dysfunction[ ]   Home Medications Prior to Admission medications   Medication Sig Start Date End Date Taking? Authorizing Provider  acetaminophen (TYLENOL) 500 MG tablet Take 1,000 mg by mouth every 4 (four) hours as needed for pain or fever.   Yes Historical Provider, MD  alum & mag hydroxide-simeth (MAALOX/MYLANTA) 200-200-20 MG/5ML suspension Take 30 mLs by mouth every 6 (six) hours as needed for indigestion.   Yes Historical Provider, MD  amLODipine-benazepril (LOTREL) 5-20 MG per capsule Take 1 capsule by mouth daily.     Yes Historical Provider, MD  anti-nausea (EMETROL) solution Take 30 mLs by mouth every 30 (thirty) minutes as needed for nausea (up to 4 times a day).   Yes Historical Provider, MD  Artificial Tears 0.1-0.3 % SOLN Place 1 drop into both eyes 3 (three) times daily.    Yes Historical Provider, MD  atorvastatin (LIPITOR) 80 MG tablet Take 80 mg by mouth daily.  07/29/10  Yes Dolores Patty, MD  furosemide (LASIX) 80 MG tablet Take 1 tablet (80 mg total) by mouth daily. 11/29/10  Yes Dolores Patty, MD  insulin aspart (NOVOLOG) 100 UNIT/ML injection Inject 12 Units into the skin 3 (three) times daily with meals as needed for high blood sugar (hold if CBG is less than 100).   Yes Historical Provider, MD  insulin glargine (LANTUS) 100 UNIT/ML injection Inject 60 Units into the skin 2 (two) times daily.    Yes Historical Provider, MD  levothyroxine (SYNTHROID, LEVOTHROID) 50 MCG tablet Take 50 mcg by mouth daily before breakfast.   Yes Historical Provider, MD   loperamide (IMODIUM) 2 MG capsule Take 2 mg by mouth 4 (four) times daily as needed for diarrhea or loose stools.   Yes Historical Provider, MD  magnesium hydroxide (MILK OF MAGNESIA) 400 MG/5ML suspension Take 30 mLs by mouth 2 (two) times daily as needed for constipation.   Yes Historical Provider, MD  metolazone (ZAROXOLYN) 2.5 MG tablet Take 2.5 mg by mouth daily as needed (when weight is above 220 pounds.). 30 minutes prior to lasix  when weight is above 220 pounds.   Yes Historical Provider, MD  Multiple Vitamin (MULTIVITAMIN) capsule Take 1 capsule by mouth daily.    Yes Historical Provider, MD  omeprazole (PRILOSEC) 20 MG capsule Take 20 mg by mouth daily.     Yes Historical Provider, MD  potassium chloride SA (K-DUR,KLOR-CON) 20 MEQ tablet Take 20 mEq by mouth daily.    Yes Historical Provider, MD  sodium phosphate (FLEET) enema Place 1 enema rectally as needed (if constipation not relieved by milk of magnesia). follow package directions   Yes Historical Provider, MD  warfarin (COUMADIN) 5 MG tablet Take 5 mg by mouth every other day.   Yes Historical Provider, MD  warfarin (COUMADIN) 7.5 MG tablet Take 7.5 mg by mouth every other day.   Yes Historical Provider, MD  NON FORMULARY Diabetic test strips and lancets. Any brand. Check 4 times a day     Historical Provider, MD    Past Medical History: Past Medical History  Diagnosis Date  . CHF (congestive heart failure)     secondary to diastolic function. Echo 4/09: EF 55%  . CAD (coronary artery disease)     status post CABG in 1996  . Paroxysmal atrial fibrillation   . Diabetes mellitus     type was not specified  . HTN (hypertension)   . Hyperlipidemia   . Chronic renal insufficiency     with a base line creatinine of 1.6  . Cerebrovascular accident     Hx of it, Left sided facial droop  . Depression   . Carotid bruit     s/p carotids in 2006, showed mild hard plaque in the bulbs and proximal bifurcations, left greater that  right. Both ICAs take steep posterior dives. Right ICA 0-39% stenosis; left ICA 40-59% stenosis.  Carrotids 9/20: 0-39%  . BPH (benign prostatic hyperplasia)     Hx of it.  . S/P cholecystectomy   . Fall     C2 fracture/ 12/15/08  . Dementia     Past Surgical History: Past Surgical History  Procedure Laterality Date  . Coronary artery bypass graft      Family History: History reviewed. No pertinent family history.  Social History: History   Social History  . Marital Status: Widowed    Spouse Name: N/A    Number of Children: N/A  .  Years of Education: N/A   Social History Main Topics  . Smoking status: Former Smoker    Quit date: 06/22/1972  . Smokeless tobacco: None  . Alcohol Use: No  . Drug Use: No  . Sexually Active: None   Other Topics Concern  . None   Social History Narrative   Single     Allergies:  Allergies  Allergen Reactions  . Heparin Other (See Comments)    HIT  . Moxifloxacin Other (See Comments)    Per MAR  . Penicillins Other (See Comments)    Per MAR    Objective:    Vital Signs:   Temp:  [97.5 F (36.4 C)-98.2 F (36.8 C)] 97.5 F (36.4 C) (05/12 0441) Pulse Rate:  [72-81] 81 (05/12 0441) Resp:  [20] 20 (05/12 0441) BP: (119-147)/(68-72) 147/68 mmHg (05/12 0441) SpO2:  [93 %-100 %] 93 % (05/12 0441) Weight:  [96.2 kg (212 lb 1.3 oz)] 96.2 kg (212 lb 1.3 oz) (05/12 0441) Last BM Date: 06/21/12  Weight change: Filed Weights   06/23/12 0613 06/24/12 0456 06/25/12 0441  Weight: 94.7 kg (208 lb 12.4 oz) 95.4 kg (210 lb 5.1 oz) 96.2 kg (212 lb 1.3 oz)    Intake/Output:   Intake/Output Summary (Last 24 hours) at 06/25/12 1021 Last data filed at 06/25/12 0929  Gross per 24 hour  Intake    860 ml  Output    375 ml  Net    485 ml     Physical Exam: General:  Elderly. Lying in bed No resp difficulty HEENT: normal Neck: supple. JVP to jaw . Carotids 2+ bilat; no bruits. No lymphadenopathy or thryomegaly appreciated. Cor: PMI  nonpalpable. Distant HS Irregular rate & rhythm. No rubs, gallops or murmurs. Lungs: clear. Decreased L base.  Abdomen: soft, nontender, nondistended. No hepatosplenomegaly. No bruits or masses. Good bowel sounds. Extremities: no cyanosis, clubbing, rash, edema Neuro: alert. follwos commands  moves all 4 extremities w/o difficulty. Affect pleasant  Telemetry: AF 100  Labs: Basic Metabolic Panel:  Recent Labs Lab 06/22/12 1907 06/23/12 0530 06/24/12 0434  NA 139 139 138  K 3.9 3.2* 3.5  CL 101 101 98  CO2 26 29 27   GLUCOSE 85 57* 77  BUN 26* 27* 32*  CREATININE 2.08* 2.04* 2.08*  CALCIUM 9.0 8.6 9.0    Liver Function Tests: No results found for this basename: AST, ALT, ALKPHOS, BILITOT, PROT, ALBUMIN,  in the last 168 hours No results found for this basename: LIPASE, AMYLASE,  in the last 168 hours No results found for this basename: AMMONIA,  in the last 168 hours  CBC:  Recent Labs Lab 06/22/12 1907 06/23/12 0530 06/24/12 0434  WBC 12.4* 11.0* 8.6  NEUTROABS 10.3*  --   --   HGB 13.3 12.6* 12.6*  HCT 40.6 39.0 39.4  MCV 81.9 81.9 83.7  PLT 140* 136* 145*    Cardiac Enzymes: No results found for this basename: CKTOTAL, CKMB, CKMBINDEX, TROPONINI,  in the last 168 hours  BNP: BNP (last 3 results)  Recent Labs  06/22/12 1907  PROBNP 4692.0*    CBG:  Recent Labs Lab 06/23/12 2138 06/24/12 1038 06/24/12 1630 06/24/12 2054 06/25/12 0631  GLUCAP 89 100* 106* 95 92    Coagulation Studies:  Recent Labs  06/23/12 0530 06/24/12 0434 06/25/12 0450  LABPROT 18.5* 17.8* 20.1*  INR 1.59* 1.51* 1.78*    Other results: EKG: 06/22/12. AF 109 IVCD  No ST-T wave abnormalities.    Imaging:  Dg Chest 2 View  06/25/2012  *RADIOLOGY REPORT*  Clinical Data: Shortness of breath.  Left pleural effusion.  CHEST - 2 VIEW  Comparison: 06/22/2012  Findings: AP and lateral views of the chest show cardiomegaly with vascular congestion.  Lung volumes remain low.   Asymmetric elevation left hemidiaphragm noted with left base collapse / consolidation.  There is associated left pleural effusion. Telemetry leads overlie the chest. Bones are diffusely demineralized.  Degenerative changes are noted in the left shoulder.  IMPRESSION: Stable.  Low volume film with cardiomegaly and vascular congestion.  Left base collapse / consolidation with effusion.   Original Report Authenticated By: Kennith Center, M.D.      Medications:     Current Medications: . atorvastatin  80 mg Oral Daily  . aztreonam  1 g Intravenous Q8H  . furosemide  40 mg Intravenous BID  . hydrALAZINE  25 mg Oral Q8H  . insulin aspart  0-5 Units Subcutaneous QHS  . insulin aspart  0-9 Units Subcutaneous TID WC  . isosorbide mononitrate  30 mg Oral Daily  . levothyroxine  50 mcg Oral QAC breakfast  . metoprolol tartrate  12.5 mg Oral BID  . multivitamin with minerals  1 tablet Oral Daily  . pantoprazole  40 mg Oral Daily  . potassium chloride SA  20 mEq Oral Daily  . vancomycin  1,000 mg Intravenous Q24H  . [START ON 06/26/2012] warfarin  5 mg Oral Q48H  . warfarin  7.5 mg Oral Q48H  . Warfarin - Pharmacist Dosing Inpatient   Does not apply q1800    Infusions: . sodium chloride 10 mL/hr at 06/22/12 2354     Assessment:   1. Healthcare associated (SNF) acquired PNA 2. L pleural effusion - ?parapneumonic 3. Acute systolic HF - EF 16-10% 4. Chronic diastolic HF 5. Chronic AF on coumadin 6. Chronic renal failure 7. CAD s/p CABG 8. DM2 9. Dementia, early 38. DNR/DNI 11. H/o HIT   Plan/Discussion:    I think major issue remains his pneumonia and associated pleural effusion (? Empyema). Effusion not improving with diuresis. Will ask pulmonary to see (we spoke with them)  with regard to diagnostic and therapeutic thoracentesis. May need CT at some point. Send sputum culture. Hold coumadin now for possible thoracentesis. Has h/o HIT so would place SCDs while INR is low.   Reduced EF  is new. Etiology unclear. Does not appear to be ischemic. Volume status does appear up but not overly symptomatic. Will increase lasix to 80 IV bid for one day. Watch renal function very closely. Continue hydralazine and low dose b-blocker as tolerated. No ACE/ARB due to renal failure.   Length of Stay: 3  Arvilla Meres 06/25/2012, 10:21 AM  Advanced Heart Failure Team Pager [phone] (M-F; 7a - 4p)  Please contact [facility] for night-coverage after hours (4p -7a ) and weekends on [email or web address]

## 2012-06-25 NOTE — Progress Notes (Signed)
Pt son called me to the room concerned about pts worsening confusion.  Upon assessment noticed pt could not form words and was not making any sense.  VS stable, called rapid response, code stroke, and Dr. Gonzella Lex.  Pt taken to CT for a stat CT scan of the head.  Will continue to monitor.

## 2012-06-25 NOTE — Progress Notes (Signed)
Clinical Social Worker spoke with Research scientist (physical sciences) at [facility] to review PT recommendations.  Wayne Montoya stated pt is ok to return.  CSW submitted PT and MD clinicals for ALF review.  CSW to continue to follow and assist as needed.   Angelia Mould, MSW, [place] [phone]

## 2012-06-25 NOTE — Consult Note (Signed)
PULMONARY  / CRITICAL CARE MEDICINE  Name: Wayne Montoya MRN: [id number] DOB: 08-09-1922    ADMISSION DATE:  06/22/2012 CONSULTATION DATE:  5/12  REFERRING MD :  Bensimhon PRIMARY SERVICE:  [facility]  CHIEF COMPLAINT:  Pleural effusion  BRIEF PATIENT DESCRIPTION: 79 yo male SNF resident with mult medical problems including CHF, AFib on coumadin, chronic renal insufficiency, hx CVA admitted by [facility] 5/9 with HCAP and acute on chronic diastolic CHF after presenting with fevers, SOB and confusion.  Rx with abx for HCAP and diuresis but remained dyspneic and CXR showed L pleural effusion for which PCCM was consulted.   SIGNIFICANT EVENTS / STUDIES:  2D echo 5/11>> EF 30-35%. Mod LVH, mod/severely reduced systolic function, diffuse hypokinesis, mod dilated LA   LINES / TUBES: none  CULTURES: Urine 5/9>>> neg   ANTIBIOTICS: Aztreonam 5/9>>> Vanc 5/9>>>  HISTORY OF PRESENT ILLNESS:  79 yo male SNF resident with mult medical problems including CHF, chronic renal insufficiency, hx CVA admitted by [facility] 5/9 with HCAP and acute on chronic diastolic CHF after presenting with fevers, SOB and confusion.  Rx with abx for HCAP and diuresis but remained dyspneic and CXR showed L pleural effusion for which PCCM was consulted.  -- Doesn't know why he's here, "I dont even know why they brought me here!".  Angry, wants to go back to SNF.   PAST MEDICAL HISTORY :  Past Medical History  Diagnosis Date  . CHF (congestive heart failure)     secondary to diastolic function. Echo 4/09: EF 55%  . CAD (coronary artery disease)     status post CABG in 1996  . Paroxysmal atrial fibrillation   . Diabetes mellitus     type was not specified  . HTN (hypertension)   . Hyperlipidemia   . Chronic renal insufficiency     with a base line creatinine of 1.6  . Cerebrovascular accident     Hx of it, Left sided facial droop  . Depression   . Carotid bruit     s/p carotids in 2006, showed mild hard plaque in the bulbs  and proximal bifurcations, left greater that right. Both ICAs take steep posterior dives. Right ICA 0-39% stenosis; left ICA 40-59% stenosis.  Carrotids 9/20: 0-39%  . BPH (benign prostatic hyperplasia)     Hx of it.  . S/P cholecystectomy   . Fall     C2 fracture/ 12/15/08  . Dementia    Past Surgical History  Procedure Laterality Date  . Coronary artery bypass graft     Prior to Admission medications   Medication Sig Start Date End Date Taking? Authorizing Provider  acetaminophen (TYLENOL) 500 MG tablet Take 1,000 mg by mouth every 4 (four) hours as needed for pain or fever.   Yes Historical Provider, MD  alum & mag hydroxide-simeth (MAALOX/MYLANTA) 200-200-20 MG/5ML suspension Take 30 mLs by mouth every 6 (six) hours as needed for indigestion.   Yes Historical Provider, MD  amLODipine-benazepril (LOTREL) 5-20 MG per capsule Take 1 capsule by mouth daily.     Yes Historical Provider, MD  anti-nausea (EMETROL) solution Take 30 mLs by mouth every 30 (thirty) minutes as needed for nausea (up to 4 times a day).   Yes Historical Provider, MD  Artificial Tears 0.1-0.3 % SOLN Place 1 drop into both eyes 3 (three) times daily.    Yes Historical Provider, MD  atorvastatin (LIPITOR) 80 MG tablet Take 80 mg by mouth daily. 07/29/10  Yes Dolores Patty, MD  furosemide (LASIX) 80 MG tablet Take 1 tablet (80 mg total) by mouth daily. 11/29/10  Yes Dolores Patty, MD  insulin aspart (NOVOLOG) 100 UNIT/ML injection Inject 12 Units into the skin 3 (three) times daily with meals as needed for high blood sugar (hold if CBG is less than 100).   Yes Historical Provider, MD  insulin glargine (LANTUS) 100 UNIT/ML injection Inject 60 Units into the skin 2 (two) times daily.    Yes Historical Provider, MD  levothyroxine (SYNTHROID, LEVOTHROID) 50 MCG tablet Take 50 mcg by mouth daily before breakfast.   Yes Historical Provider, MD  loperamide (IMODIUM) 2 MG capsule Take 2 mg by mouth 4 (four) times daily as  needed for diarrhea or loose stools.   Yes Historical Provider, MD  magnesium hydroxide (MILK OF MAGNESIA) 400 MG/5ML suspension Take 30 mLs by mouth 2 (two) times daily as needed for constipation.   Yes Historical Provider, MD  metolazone (ZAROXOLYN) 2.5 MG tablet Take 2.5 mg by mouth daily as needed (when weight is above 220 pounds.). 30 minutes prior to lasix  when weight is above 220 pounds.   Yes Historical Provider, MD  Multiple Vitamin (MULTIVITAMIN) capsule Take 1 capsule by mouth daily.    Yes Historical Provider, MD  omeprazole (PRILOSEC) 20 MG capsule Take 20 mg by mouth daily.     Yes Historical Provider, MD  potassium chloride SA (K-DUR,KLOR-CON) 20 MEQ tablet Take 20 mEq by mouth daily.    Yes Historical Provider, MD  sodium phosphate (FLEET) enema Place 1 enema rectally as needed (if constipation not relieved by milk of magnesia). follow package directions   Yes Historical Provider, MD  warfarin (COUMADIN) 5 MG tablet Take 5 mg by mouth every other day.   Yes Historical Provider, MD  warfarin (COUMADIN) 7.5 MG tablet Take 7.5 mg by mouth every other day.   Yes Historical Provider, MD  NON FORMULARY Diabetic test strips and lancets. Any brand. Check 4 times a day     Historical Provider, MD   Allergies  Allergen Reactions  . Heparin Other (See Comments)    HIT  . Moxifloxacin Other (See Comments)    Per MAR  . Penicillins Other (See Comments)    Per MAR    FAMILY HISTORY:  History reviewed. No pertinent family history. SOCIAL HISTORY:  reports that he quit smoking about 40 years ago. He does not have any smokeless tobacco history on file. He reports that he does not drink alcohol or use illicit drugs.  REVIEW OF SYSTEMS:   C/o mild SOB, cough, abd "fullness".  Denies chest pain, cough, hemoptysis.  All other systems reviewed and were neg.   VITAL SIGNS: Temp:  [97.5 F (36.4 C)-98.2 F (36.8 C)] 97.5 F (36.4 C) (05/12 0441) Pulse Rate:  [72-81] 81 (05/12 0441) Resp:   [20] 20 (05/12 0441) BP: (119-147)/(68-72) 147/68 mmHg (05/12 0441) SpO2:  [93 %-100 %] 93 % (05/12 0441) Weight:  [212 lb 1.3 oz (96.2 kg)] 212 lb 1.3 oz (96.2 kg) (05/12 0441)  PHYSICAL EXAMINATION: General:  Chronically ill appearing male, mildly agitated Neuro:  Awake, alert, confused at times, MAE HEENT:  Mm moist, +JVD Cardiovascular:  s1s2 rrr Lungs:  resps even, non labored on [place], diminished throughout Abdomen:  Round, mildly distended, non tender Ext: warm and dry, 1+ BLE edema    Recent Labs Lab 06/22/12 1907 06/23/12 0530 06/24/12 0434  NA 139 139 138  K 3.9 3.2* 3.5  CL 101 101  98  CO2 26 29 27   BUN 26* 27* 32*  CREATININE 2.08* 2.04* 2.08*  GLUCOSE 85 57* 77    Recent Labs Lab 06/22/12 1907 06/23/12 0530 06/24/12 0434  HGB 13.3 12.6* 12.6*  HCT 40.6 39.0 39.4  WBC 12.4* 11.0* 8.6  PLT 140* 136* 145*   Dg Chest 2 View  06/25/2012  *RADIOLOGY REPORT*  Clinical Data: Shortness of breath.  Left pleural effusion.  CHEST - 2 VIEW  Comparison: 06/22/2012  Findings: AP and lateral views of the chest show cardiomegaly with vascular congestion.  Lung volumes remain low.  Asymmetric elevation left hemidiaphragm noted with left base collapse / consolidation.  There is associated left pleural effusion. Telemetry leads overlie the chest. Bones are diffusely demineralized.  Degenerative changes are noted in the left shoulder.  IMPRESSION: Stable.  Low volume film with cardiomegaly and vascular congestion.  Left base collapse / consolidation with effusion.   Original Report Authenticated By: Kennith Center, M.D.     ASSESSMENT / PLAN:  Acute systolic CHF L pleural effusion  HCAP - improving, afebrile, WBC improved L pleural effusion - ?parapneumonic v CHF.  Appears slightly improved from 5/9 CXR with diuresis. And now asymptomatic (in setting delirium / dementia)  PLAN -  - he gets agitated very easy, afebrile, ON RA and no distress, not a clinical syndrome for  empyema -son concerned agitation for thora and safety , i agree as of now with above circumstances -repeat pcxr in am with continued diuresis -if declines or fevers, will tap - Coumadin on hold in case needs tap - cont diuresis per Dr. [name]  - Cont abx for HCAP  - f/u CXR as neg balance obtained -add IS, as some of what we see may be ATX -son also feels would not even stay still for Ct to ensure no loculations, which was an option offered: another approach to avoid throa  Will follow up in am   [facility], NP 06/25/2012  11:29 AM Pager: [phone] or [phone]  *Care during the described time interval was provided by me and/or other providers on the critical care team. I have reviewed this patient's available data, including medical history, events of note, physical examination and test results as part of my evaluation.   I have fully examined this patient and agree with above findings.   ' And edited in full  Mcarthur Rossetti. Tyson Alias, MD, FACP Pgr: [phone] [facility]

## 2012-06-25 NOTE — Progress Notes (Signed)
Patient reported to be more confused during the day and ? Slurred speech and word finding difficulty noted by his son.  Code stroke called. patient does have some word finding difficulty and speech slightly slurred but improving. No focal neurological deficit noted on exam. Vitals stable. Stat CT ordered.  patient seen by neurology. Will follow head CT. UA ordered.

## 2012-06-25 NOTE — Progress Notes (Signed)
Clinical Social Work Department BRIEF PSYCHOSOCIAL ASSESSMENT 06/25/2012  Patient:  Wayne Montoya, Wayne Montoya     Account Number:  [id number]     Admit date:  06/22/2012  Clinical Social Worker:  Margaree Mackintosh  Date/Time:  06/25/2012 11:31 AM  Referred by:  Physician  Date Referred:  06/25/2012 Referred for  SNF Placement   Other Referral:   Pt admitted from [facility].   Interview type:  Family Other interview type:   Pt not currently fully oriented.    PSYCHOSOCIAL DATA Living Status:  FACILITY Admitted from facility:  OTHER Level of care:  Assisted Living Primary support name:  Kaydn Kumpf: [phone] Primary support relationship to patient:  CHILD, ADULT Degree of support available:   [employer] in [place].    CURRENT CONCERNS Current Concerns  Post-Acute Placement   Other Concerns:    SOCIAL WORK ASSESSMENT / PLAN Clinical Social Worker received referral for potential SNF placement.  CSW reviewed chart and spoke with pt's son via phone.  CSW introduced self, explained role, and provided support.  CSW reviewed PT recommendations for SNF.  At this time, Son would like pt to return to [facility] "because he (pt) has dementia and I don't want to throw a SNF in the mix and cause more confusion".  Son reports he has been in communication with Inetta Fermo at [facility] and shared that pt will required PT/OT; ALF agreeable.  CSW to continue to follow and assist as needed.   Assessment/plan status:  Information/Referral to [facility] Other assessment/ plan:   Information/referral to community resources:   SNF  ALF    PATIENT'S/FAMILY'S RESPONSE TO PLAN OF CARE: Pt currently unable to fully participate in assessment due to medical condition. Son thanked CSW for intervention.

## 2012-06-25 NOTE — Consult Note (Signed)
Referring Physician: Dhungel    Chief Complaint: Acute onset confusion  HPI: Wayne Montoya is an 79 y.o. male admitted with pneumonia and fever.  Has a history of dementia felt to be secondary to an episode of hypoxia that occurred about 3 years ago.  Was confused on admission but today had acute worsening of his confusion while working with PT.  Speech was slurred and patient seemed to have a "word salad" per son.  Code stroke was called at that time.    Date last known well: 06/25/2012 Time last known well: 1700 tPA Given: No: Not felt to be a stroke  Past Medical History  Diagnosis Date  . CHF (congestive heart failure)     secondary to diastolic function. Echo 4/09: EF 55%  . CAD (coronary artery disease)     status post CABG in 1996  . Paroxysmal atrial fibrillation   . Diabetes mellitus     type was not specified  . HTN (hypertension)   . Hyperlipidemia   . Chronic renal insufficiency     with a base line creatinine of 1.6  . Cerebrovascular accident     Hx of it, Left sided facial droop  . Depression   . Carotid bruit     s/p carotids in 2006, showed mild hard plaque in the bulbs and proximal bifurcations, left greater that right. Both ICAs take steep posterior dives. Right ICA 0-39% stenosis; left ICA 40-59% stenosis.  Carrotids 9/20: 0-39%  . BPH (benign prostatic hyperplasia)     Hx of it.  . S/P cholecystectomy   . Fall     C2 fracture/ 12/15/08  . Dementia     Past Surgical History  Procedure Laterality Date  . Coronary artery bypass graft      Family history:  Patient's brother died with CHF and had DM as well.  Father had diabetes.  He has a son with hyperlipidemia. He had another son that died with multi-system cancer.    Social History:  reports that he quit smoking about 40 years ago. He does not have any smokeless tobacco history on file. He reports that he does not drink alcohol or use illicit drugs.  Allergies:  Allergies  Allergen Reactions  .  Heparin Other (See Comments)    HIT  . Moxifloxacin Other (See Comments)    Per MAR  . Penicillins Other (See Comments)    Per MAR    Medications:  I have reviewed the patient's current medications. Prior to Admission:  Prescriptions prior to admission  Medication Sig Dispense Refill  . acetaminophen (TYLENOL) 500 MG tablet Take 1,000 mg by mouth every 4 (four) hours as needed for pain or fever.      Marland Kitchen alum & mag hydroxide-simeth (MAALOX/MYLANTA) 200-200-20 MG/5ML suspension Take 30 mLs by mouth every 6 (six) hours as needed for indigestion.      Marland Kitchen amLODipine-benazepril (LOTREL) 5-20 MG per capsule Take 1 capsule by mouth daily.        Marland Kitchen anti-nausea (EMETROL) solution Take 30 mLs by mouth every 30 (thirty) minutes as needed for nausea (up to 4 times a day).      . Artificial Tears 0.1-0.3 % SOLN Place 1 drop into both eyes 3 (three) times daily.       Marland Kitchen atorvastatin (LIPITOR) 80 MG tablet Take 80 mg by mouth daily.      . furosemide (LASIX) 80 MG tablet Take 1 tablet (80 mg total) by mouth daily.      Marland Kitchen  insulin aspart (NOVOLOG) 100 UNIT/ML injection Inject 12 Units into the skin 3 (three) times daily with meals as needed for high blood sugar (hold if CBG is less than 100).      . insulin glargine (LANTUS) 100 UNIT/ML injection Inject 60 Units into the skin 2 (two) times daily.       Marland Kitchen levothyroxine (SYNTHROID, LEVOTHROID) 50 MCG tablet Take 50 mcg by mouth daily before breakfast.      . loperamide (IMODIUM) 2 MG capsule Take 2 mg by mouth 4 (four) times daily as needed for diarrhea or loose stools.      . magnesium hydroxide (MILK OF MAGNESIA) 400 MG/5ML suspension Take 30 mLs by mouth 2 (two) times daily as needed for constipation.      . metolazone (ZAROXOLYN) 2.5 MG tablet Take 2.5 mg by mouth daily as needed (when weight is above 220 pounds.). 30 minutes prior to lasix  when weight is above 220 pounds.      . Multiple Vitamin (MULTIVITAMIN) capsule Take 1 capsule by mouth daily.        Marland Kitchen omeprazole (PRILOSEC) 20 MG capsule Take 20 mg by mouth daily.        . potassium chloride SA (K-DUR,KLOR-CON) 20 MEQ tablet Take 20 mEq by mouth daily.       . sodium phosphate (FLEET) enema Place 1 enema rectally as needed (if constipation not relieved by milk of magnesia). follow package directions      . warfarin (COUMADIN) 5 MG tablet Take 5 mg by mouth every other day.      . warfarin (COUMADIN) 7.5 MG tablet Take 7.5 mg by mouth every other day.      . NON FORMULARY Diabetic test strips and lancets. Any brand. Check 4 times a day        Scheduled: . atorvastatin  80 mg Oral Daily  . aztreonam  1 g Intravenous Q8H  . furosemide  80 mg Intravenous BID  . hydrALAZINE  25 mg Oral Q8H  . insulin aspart  0-5 Units Subcutaneous QHS  . insulin aspart  0-9 Units Subcutaneous TID WC  . isosorbide mononitrate  30 mg Oral Daily  . levothyroxine  50 mcg Oral QAC breakfast  . metoprolol tartrate  12.5 mg Oral BID  . multivitamin with minerals  1 tablet Oral Daily  . pantoprazole  40 mg Oral Daily  . potassium chloride SA  20 mEq Oral Daily  . QUEtiapine  25 mg Oral QHS  . senna-docusate  1 tablet Oral BID  . vancomycin  1,000 mg Intravenous Q24H    ROS: History obtained from the son  General ROS: negative for - chills, fatigue, fever, night sweats, weight gain or weight loss Psychological ROS: memory difficulties Ophthalmic ROS: negative for - blurry vision, double vision, eye pain or loss of vision ENT ROS: negative for - epistaxis, nasal discharge, oral lesions, sore throat, tinnitus or vertigo Allergy and Immunology ROS: negative for - hives or itchy/watery eyes Hematological and Lymphatic ROS: negative for - bleeding problems, bruising or swollen lymph nodes Endocrine ROS: negative for - galactorrhea, hair pattern changes, polydipsia/polyuria or temperature intolerance Respiratory ROS: negative for - cough, hemoptysis, shortness of breath or wheezing Cardiovascular ROS: negative  for - chest pain, dyspnea on exertion, edema or irregular heartbeat Gastrointestinal ROS: negative for - abdominal pain, diarrhea, hematemesis, nausea/vomiting or stool incontinence Genito-Urinary ROS: negative for - dysuria, hematuria, incontinence or urinary frequency/urgency Musculoskeletal ROS: negative for - joint swelling or  muscular weakness Neurological ROS: as noted in HPI Dermatological ROS: negative for rash and skin lesion changes  Physical Examination: Blood pressure 127/75, pulse 90, temperature 98.9 F (37.2 C), temperature source Axillary, resp. rate 18, height 5\' 10"  (1.778 m), weight 96.2 kg (212 lb 1.3 oz), SpO2 92.00%.  Neurologic Examination: Mental Status: Alert.  Follows commands.  Speech fluent with few paraphasic error and dysarthria noted.   Cranial Nerves: II: Discs flat bilaterally; Visual fields grossly normal, pupils equal, round, reactive to light and accommodation III,IV, VI: ptosis not present, extra-ocular motions intact bilaterally V,VII: smile symmetric, facial light touch sensation normal bilaterally VIII: hearing normal bilaterally IX,X: gag reflex present XI: bilateral shoulder shrug XII: midline tongue extension Motor: Right : Upper extremity   5/5    Left:     Upper extremity   5/5  Lower extremity   5/5     Lower extremity   5/5 Tone and bulk:normal tone throughout; no atrophy noted Sensory: Pinprick and light touch intact throughout, bilaterally Deep Tendon Reflexes: 2+ and symmetric with absent AJ's bilaterally Plantars: Right: upgoing   Left: downgoing Cerebellar: normal finger-to-nose and normal heel-to-shin test Gait: Unable to test CV: pulses palpable throughout    Laboratory Studies:  Basic Metabolic Panel:  Recent Labs Lab 06/22/12 1907 06/23/12 0530 06/24/12 0434 06/25/12 1059  NA 139 139 138 136  K 3.9 3.2* 3.5 3.7  CL 101 101 98 98  CO2 26 29 27 25   GLUCOSE 85 57* 77 94  BUN 26* 27* 32* 34*  CREATININE 2.08* 2.04*  2.08* 1.86*  CALCIUM 9.0 8.6 9.0 9.3    Liver Function Tests:  Recent Labs Lab 06/25/12 1059  AST 17  ALT 10  ALKPHOS 66  BILITOT 0.6  PROT 7.0  ALBUMIN 3.0*   No results found for this basename: LIPASE, AMYLASE,  in the last 168 hours No results found for this basename: AMMONIA,  in the last 168 hours  CBC:  Recent Labs Lab 06/22/12 1907 06/23/12 0530 06/24/12 0434  WBC 12.4* 11.0* 8.6  NEUTROABS 10.3*  --   --   HGB 13.3 12.6* 12.6*  HCT 40.6 39.0 39.4  MCV 81.9 81.9 83.7  PLT 140* 136* 145*    Cardiac Enzymes: No results found for this basename: CKTOTAL, CKMB, CKMBINDEX, TROPONINI,  in the last 168 hours  BNP: No components found with this basename: POCBNP,   CBG:  Recent Labs Lab 06/24/12 2054 06/25/12 0631 06/25/12 1136 06/25/12 1634 06/25/12 1736  GLUCAP 95 92 101* 84 78    Microbiology: Results for orders placed during the hospital encounter of 06/22/12  URINE CULTURE     Status: None   Collection Time    06/22/12  9:39 PM      Result Value Range Status   Specimen Description URINE, CLEAN CATCH   Final   Special Requests NONE   Final   Culture  Setup Time 06/22/2012 23:25   Final   Colony Count NO GROWTH   Final   Culture NO GROWTH   Final   Report Status 06/24/2012 FINAL   Final  MRSA PCR SCREENING     Status: None   Collection Time    06/22/12 11:04 PM      Result Value Range Status   MRSA by PCR NEGATIVE  NEGATIVE Final   Comment:            The GeneXpert MRSA Assay (FDA     approved for NASAL specimens  only), is one component of a     comprehensive MRSA colonization     surveillance program. It is not     intended to diagnose MRSA     infection nor to guide or     monitor treatment for     MRSA infections.    Coagulation Studies:  Recent Labs  06/23/12 0530 06/24/12 0434 06/25/12 0450  LABPROT 18.5* 17.8* 20.1*  INR 1.59* 1.51* 1.78*    Urinalysis:  Recent Labs Lab 06/22/12 2139  COLORURINE YELLOW  LABSPEC  1.016  PHURINE 5.5  GLUCOSEU NEGATIVE  HGBUR MODERATE*  BILIRUBINUR NEGATIVE  KETONESUR NEGATIVE  PROTEINUR >300*  UROBILINOGEN 1.0  NITRITE NEGATIVE  LEUKOCYTESUR NEGATIVE    Lipid Panel:    Component Value Date/Time   CHOL 94 09/18/2007 0939   TRIG 95 09/18/2007 0939   HDL 35.0* 09/18/2007 0939   CHOLHDL 2.7 CALC 09/18/2007 0939   VLDL 19 09/18/2007 0939   LDLCALC 40 09/18/2007 0939    HgbA1C:  Lab Results  Component Value Date   HGBA1C 6.6* 06/23/2012    Urine Drug Screen:   No results found for this basename: labopia, cocainscrnur, labbenz, amphetmu, thcu, labbarb    Alcohol Level: No results found for this basename: ETH,  in the last 168 hours  Other results: EKG: (5/9) atrial fibrillation  Imaging: Dg Chest 2 View  06/25/2012  *RADIOLOGY REPORT*  Clinical Data: Shortness of breath.  Left pleural effusion.  CHEST - 2 VIEW  Comparison: 06/22/2012  Findings: AP and lateral views of the chest show cardiomegaly with vascular congestion.  Lung volumes remain low.  Asymmetric elevation left hemidiaphragm noted with left base collapse / consolidation.  There is associated left pleural effusion. Telemetry leads overlie the chest. Bones are diffusely demineralized.  Degenerative changes are noted in the left shoulder.  IMPRESSION: Stable.  Low volume film with cardiomegaly and vascular congestion.  Left base collapse / consolidation with effusion.   Original Report Authenticated By: Kennith Center, M.D.    Ct Head Wo Contrast  06/25/2012  *RADIOLOGY REPORT*  Clinical Data: Code stroke, a fascia.  CT HEAD WITHOUT CONTRAST  Technique:  Contiguous axial images were obtained from the base of the skull through the vertex without contrast.  Comparison: 06/23/2012  Findings: There is atrophy and chronic small vessel disease changes.  Old lacunar infarct in the left basal ganglia. No acute intracranial abnormality.  Specifically, no hemorrhage, hydrocephalus, mass lesion, acute infarction, or  significant intracranial injury.  No acute calvarial abnormality.  Extensive paranasal sinus disease throughout the paranasal sinuses. Mastoids are clear.  IMPRESSION: No acute intracranial abnormality.  Atrophy, chronic microvascular disease.  Severe diffuse paranasal sinus disease.  Critical Value/emergent results were called by telephone at the time of interpretation on 06/25/2012 at 6:09 p.m. to Dr. Thad Ranger, who verbally acknowledged these results.   Original Report Authenticated By: Charlett Nose, M.D.     Assessment: 79 y.o. male with multiple risk factors and a history of dementia who experienced an acute onset of worsening mental status today.  No focality on examination.  Head CT reviewed and shows no acute changes.  Speech improving.  Concerned that symptoms may actually be related to the baseline dementia.  Patient with a superimposed infection.  Patient also with multiple risk factors for stroke and on no current anticoagulation.  A1c on 5/10 was 6.6.    Stroke Risk Factors - atrial fibrillation, diabetes mellitus, hyperlipidemia and hypertension  Plan: 1. Fasting lipid panel 2.  MRI of the brain without contrast 3. Continued therapy 4. Prophylactic therapy-Unclear why Coumadin was discontinued but will therefore start ASA.   5. Risk factor modification 6. Telemetry monitoring 7. Frequent neuro checks 8. Agree with continued medical therapy   Thana Farr, MD [facility] [phone] 06/25/2012, 6:12 PM

## 2012-06-25 NOTE — Clinical Documentation Improvement (Signed)
CKD DOCUMENTATION CLARIFICATION QUERY   THIS DOCUMENT IS NOT A PERMANENT PART OF THE MEDICAL RECORD  TO RESPOND TO THE THIS QUERY, FOLLOW THE INSTRUCTIONS BELOW:  1. If needed, update documentation for the patient's encounter via the notes activity.  2. Access this query again and click edit on the In [employer].  3. After updating, or not, click F2 to complete all highlighted (required) fields concerning your review. Select "additional documentation in the medical record" OR "no additional documentation provided".  4. Click Sign note button.  5. The deficiency will fall out of your In Basket *Please let us know if you are not able to complete this workflow by phone or e-mail (listed below).  Please update your documentation within the medical record to reflect your response to this query.                                                                                        06/25/12   Dear Dr. Gonzella Lex,   In a better effort to capture your patient's severity of illness, reflect appropriate length of stay and utilization of resources, a review of the patient medical record has revealed the following indicators.     Possible Clinical Conditions?  - CKD Stage I -  GFR > OR = 90 - CKD Stage II - GFR 60-80 - CKD Stage III - GFR 30-59 - CKD Stage IV - GFR 15-29 - CKD Stage V - GFR < 15 - ESRD (End Stage Renal Disease) - Other condition  Supporting Information: - Risk Factors:  "CKD" in Notes - BUN/Creatinine level: 34/1.86 - Calculated GFR: 31/36 - Treatment: NS IV 10-20 ml/hr, monitor renal function while on Lasix  You may use possible, probable, or suspect with inpatient documentation. possible, probable, suspected diagnoses MUST be documented at the time of discharge  Reviewed: addendum made to progress note from 06/24/12   Thank You,  Beverley Fiedler RN BSN Clinical Documentation Specialist: Tele Contact:  [phone]  Health Information Management [place]

## 2012-06-25 NOTE — Progress Notes (Signed)
[facility] PROGRESS NOTE  Wayne Montoya ZOX:[id number] DOB: Nov 17, 1922 DOA: 06/22/2012 PCP: Junious Dresser, MD  Brief narrative;  79 y/o male with CHF ( follows with heart failure clinic), CAD s/p CABG, paroxysmal a fib on coumadin, CVA, DM, HTN, CKD, was admitted for worsening SOB , fever and some confusion with findings of pneumonia and CHF.  Assessment/Plan:  HCAP  On empiric coverage with vanco and aztreonam  No blood cx sent on admission  Tylenol prn for fever . Remains afebrile and leukocytosis has resolved. Repeat chest x-ray however still shows left-sided consolidation with effusion. Pulmonary consulted for diagnostic and therapeutic thoracentesis.  chronic diastolic CHF .  Elevated pro BNP and pulmonary vascular congestion on CXR  diuresing with IV lasix 40 mg bid. Increase goes to 80 mg twice a day today On 80 mg po daily at home.  Hold ARB given renal insufficiency. No recent baseline renal fn known. Added metoprolol and hydralazine Monitor strict I/O and daily weight  Appreciate cardiology evaluation.  2D echo results show severely reduced EF of 30-35%. Comments on possible vegetation on aortic valve. Discussed with cardiology. Low EF appears to be new. Recommend to follow with increased diuresis.  CKD  no recent baseline. As per son renal function has been worsening. Continue lasix for now and monitor. Hold ARB   DM  continue SSI. Patient on quite high dose of lantus ( 60 units bid). Will reduced to 40 bid but FSG still low yesterdau  A1C 6.6. Will hold lantus for now and continue only SSI.   AMS  Likely due to pneumonia. Clearing as per son. Patient has been quite weak for some time.head CT negative. PT eval   afib  rate controlled. On coumadin.INR sub therapeutic . Holding dose given possible thoracentesis. SCD ordered  Hypertension  continue lasix, zaroxolyn, , increased dose of amlod. added low dose BB.   hypothyroidism  Continue synthroid   Code  Status: DNR  Family Communication:discussed with son and updated Disposition Plan: PT Eval    Consultants:  Dr Clarise Cruz Pulmonary   Procedures:  none Antibiotics:  IV vanco and aztreonam   HPI/Subjective: Patient seen and examined this morning. Informs his breathing to be unchanged. Quite hard of hearing  Objective: Filed Vitals:   06/24/12 1000 06/24/12 1341 06/24/12 2058 06/25/12 0441  BP: 140/56 128/72 119/70 147/68  Pulse: 62 74 72 81  Temp:  98.2 F (36.8 C) 98 F (36.7 C) 97.5 F (36.4 C)  TempSrc:  Oral Oral Oral  Resp:  20 20 20   Height:      Weight:    96.2 kg (212 lb 1.3 oz)  SpO2:  100% 100% 93%    Intake/Output Summary (Last 24 hours) at 06/25/12 1113 Last data filed at 06/25/12 0929  Gross per 24 hour  Intake    860 ml  Output    375 ml  Net    485 ml   Filed Weights   06/23/12 0613 06/24/12 0456 06/25/12 0441  Weight: 94.7 kg (208 lb 12.4 oz) 95.4 kg (210 lb 5.1 oz) 96.2 kg (212 lb 1.3 oz)    Exam:   General: Elderly male lying in bed in NAD, appears fatigued  HEENT: no pallor, moist mucosa  Chest: b/l basal crackles , improved today  Cardiovascular: NS1&S2, no murmurs Abd: soft, NT, ND, BS+  EXT: 1+ pitting edema b/l  CNS: AAOX2  Data Reviewed: Basic Metabolic Panel:  Recent Labs Lab 06/22/12 1907 06/23/12 0530 06/24/12 0434  NA 139 139 138  K 3.9 3.2* 3.5  CL 101 101 98  CO2 26 29 27   GLUCOSE 85 57* 77  BUN 26* 27* 32*  CREATININE 2.08* 2.04* 2.08*  CALCIUM 9.0 8.6 9.0   Liver Function Tests: No results found for this basename: AST, ALT, ALKPHOS, BILITOT, PROT, ALBUMIN,  in the last 168 hours No results found for this basename: LIPASE, AMYLASE,  in the last 168 hours No results found for this basename: AMMONIA,  in the last 168 hours CBC:  Recent Labs Lab 06/22/12 1907 06/23/12 0530 06/24/12 0434  WBC 12.4* 11.0* 8.6  NEUTROABS 10.3*  --   --   HGB 13.3 12.6* 12.6*  HCT 40.6 39.0 39.4  MCV 81.9 81.9 83.7  PLT  140* 136* 145*   Cardiac Enzymes: No results found for this basename: CKTOTAL, CKMB, CKMBINDEX, TROPONINI,  in the last 168 hours BNP (last 3 results)  Recent Labs  06/22/12 1907  PROBNP 4692.0*   CBG:  Recent Labs Lab 06/23/12 2138 06/24/12 1038 06/24/12 1630 06/24/12 2054 06/25/12 0631  GLUCAP 89 100* 106* 95 92    Recent Results (from the past 240 hour(s))  URINE CULTURE     Status: None   Collection Time    06/22/12  9:39 PM      Result Value Range Status   Specimen Description URINE, CLEAN CATCH   Final   Special Requests NONE   Final   Culture  Setup Time 06/22/2012 23:25   Final   Colony Count NO GROWTH   Final   Culture NO GROWTH   Final   Report Status 06/24/2012 FINAL   Final  MRSA PCR SCREENING     Status: None   Collection Time    06/22/12 11:04 PM      Result Value Range Status   MRSA by PCR NEGATIVE  NEGATIVE Final   Comment:            The GeneXpert MRSA Assay (FDA     approved for NASAL specimens     only), is one component of a     comprehensive MRSA colonization     surveillance program. It is not     intended to diagnose MRSA     infection nor to guide or     monitor treatment for     MRSA infections.     Studies: Dg Chest 2 View  06/25/2012  *RADIOLOGY REPORT*  Clinical Data: Shortness of breath.  Left pleural effusion.  CHEST - 2 VIEW  Comparison: 06/22/2012  Findings: AP and lateral views of the chest show cardiomegaly with vascular congestion.  Lung volumes remain low.  Asymmetric elevation left hemidiaphragm noted with left base collapse / consolidation.  There is associated left pleural effusion. Telemetry leads overlie the chest. Bones are diffusely demineralized.  Degenerative changes are noted in the left shoulder.  IMPRESSION: Stable.  Low volume film with cardiomegaly and vascular congestion.  Left base collapse / consolidation with effusion.   Original Report Authenticated By: Kennith Center, M.D.     Scheduled Meds: . atorvastatin   80 mg Oral Daily  . aztreonam  1 g Intravenous Q8H  . furosemide  40 mg Intravenous Once  . furosemide  80 mg Intravenous BID  . hydrALAZINE  25 mg Oral Q8H  . insulin aspart  0-5 Units Subcutaneous QHS  . insulin aspart  0-9 Units Subcutaneous TID WC  . isosorbide mononitrate  30 mg Oral Daily  .  levothyroxine  50 mcg Oral QAC breakfast  . metoprolol tartrate  12.5 mg Oral BID  . multivitamin with minerals  1 tablet Oral Daily  . pantoprazole  40 mg Oral Daily  . potassium chloride SA  20 mEq Oral Daily  . vancomycin  1,000 mg Intravenous Q24H   Continuous Infusions: . sodium chloride 10 mL/hr at 06/22/12 2354      Time spent: 25 minutes    Morio Widen  [facility] Pager [phone] If 7PM-7AM, please contact night-coverage at [email or web address], password [facility] 06/25/2012, 11:13 AM  LOS: 3 days

## 2012-06-25 NOTE — Progress Notes (Signed)
Physical Therapy Treatment Patient Details Name: Wayne Montoya MRN: [id number] DOB: [birth year] Today's Date: 06/25/2012 Time: 5284-1324 PT Time Calculation (min): 22 min  PT Assessment / Plan / Recommendation Comments on Treatment Session  Pt needs much encouragement and extra time to agree to walk and he self limits distance. With son's encouragement, he ultmately agreed to return to bed to rest. Pt will benefit from continued PT with HHPT at discharge    Follow Up Recommendations  Home health PT     Does the patient have the potential to tolerate intense rehabilitation     Barriers to Discharge        Equipment Recommendations  None recommended by PT    Recommendations for Other Services    Frequency Min 3X/week   Plan Discharge plan needs to be updated;Frequency remains appropriate    Precautions / Restrictions Precautions Precautions: Fall Restrictions Weight Bearing Restrictions: No   Pertinent Vitals/Pain No c/o pain    Mobility  Bed Mobility Sit to Supine: 4: Min assist Details for Bed Mobility Assistance: needs assist to lift legs onto bed Transfers Transfers: Sit to Stand;Stand to Sit Sit to Stand: 4: Min assist Stand to Sit: 4: Min assist Stand Pivot Transfers: 4: Min assist Details for Transfer Assistance: needs enocuragement ot paricipate especially after first attempt Ambulation/Gait Ambulation/Gait Assistance: 4: Min assist Ambulation Distance (Feet): 20 Feet Assistive device: Rolling walker Ambulation/Gait Assistance Details: pt needed encouragement to continue. He self limits distance Gait Pattern: Decreased step length - right;Decreased step length - left;Step-to pattern;Trunk flexed Gait velocity: decreased General Gait Details: pt needs encouragement to walk; pt appears to have fear of falling Stairs: No Wheelchair Mobility Wheelchair Mobility: No    Exercises General Exercises - Lower Extremity Long Arc Quad: AROM;Both;10 reps;Seated Hip  Flexion/Marching: AROM;10 reps;Seated   PT Diagnosis:    PT Problem List:   PT Treatment Interventions:     PT Goals Acute Rehab PT Goals PT Goal Formulation: With patient Time For Goal Achievement: 07/01/12 Potential to Achieve Goals: Good Pt will go Supine/Side to Sit: with supervision Pt will Transfer Bed to Chair/Chair to Bed: with supervision PT Transfer Goal: Bed to Chair/Chair to Bed - Progress: Progressing toward goal Pt will Ambulate: 51 - 150 feet;with supervision PT Goal: Ambulate - Progress: Progressing toward goal  Visit Information  Last PT Received On: 06/25/12 Assistance Needed: +1    Subjective Data  Subjective: Pt said he did not want to walk out into the hallway because he was afraid he would fall Patient Stated Goal: to go back to ALF with HHPT per son   Cognition  Cognition Arousal/Alertness: Awake/alert Behavior During Therapy: WFL for tasks assessed/performed (pt resistant to continue walking) Overall Cognitive Status: History of cognitive impairments - at baseline    Balance     End of Session PT - End of Session Equipment Utilized During Treatment: Gait belt Patient left: in bed;with family/visitor present Nurse Communication: Mobility status   GP    Rosey Bath K. [place], [place] [phone] 06/25/2012, 3:59 PM

## 2012-06-25 NOTE — Progress Notes (Addendum)
ANTICOAGULATION CONSULT NOTE - Follow Up Consult  Pharmacy Consult for Coumadin Indication: atrial fibrillation  Allergies  Allergen Reactions  . Heparin Other (See Comments)    HIT  . Moxifloxacin Other (See Comments)    Per MAR  . Penicillins Other (See Comments)    Per [facility]    Patient Measurements: Height: 5\' 10"  (177.8 cm) Weight: 212 lb 1.3 oz (96.2 kg) (bed scale) IBW/kg (Calculated) : 73  Vital Signs: Temp: 97.5 F (36.4 C) (05/12 0441) Temp src: Oral (05/12 0441) BP: 147/68 mmHg (05/12 0441) Pulse Rate: 81 (05/12 0441)  Labs:  Recent Labs  06/22/12 1907 06/23/12 0530 06/24/12 0434 06/25/12 0450  HGB 13.3 12.6* 12.6*  --   HCT 40.6 39.0 39.4  --   PLT 140* 136* 145*  --   LABPROT  --  18.5* 17.8* 20.1*  INR  --  1.59* 1.51* 1.78*  CREATININE 2.08* 2.04* 2.08*  --     Estimated Creatinine Clearance: 29.1 ml/min (by C-G formula based on Cr of 2.08).   Medical History: Past Medical History  Diagnosis Date  . CHF (congestive heart failure)     secondary to diastolic function. Echo 4/09: EF 55%  . CAD (coronary artery disease)     status post CABG in 1996  . Paroxysmal atrial fibrillation   . Diabetes mellitus     type was not specified  . HTN (hypertension)   . Hyperlipidemia   . Chronic renal insufficiency     with a base line creatinine of 1.6  . Cerebrovascular accident     Hx of it, Left sided facial droop  . Depression   . Carotid bruit     s/p carotids in 2006, showed mild hard plaque in the bulbs and proximal bifurcations, left greater that right. Both ICAs take steep posterior dives. Right ICA 0-39% stenosis; left ICA 40-59% stenosis.  Carrotids 9/20: 0-39%  . BPH (benign prostatic hyperplasia)     Hx of it.  . S/P cholecystectomy   . Fall     C2 fracture/ 12/15/08  . Dementia     Assessment: 79 YOM with hx of afib - CVR on metoprolol, on coumadin PTA, pharmacy is consulted to dose coumadin. INR 1.78 this morning < goal 2-3. No cbc  today but has been stable thus far, no bleeding noted.  PTA dose 5mg /7.5mg  alternating every other day. Patient had a missed dose prior to coming that accounts for his subtherapeutic level. INR trending up after Coumadin 10mg  last pm and suspect it it to continue to rise.    Goal of Therapy:  INR 2-3 Monitor platelets by anticoagulation protocol: Yes   Plan:  Restart home Coumadin dose 7.5mg  alt with 5mg  Daily INR   Leota Sauers Pharm.D. CPP, BCPS Clinical Pharmacist [phone] 06/25/2012 8:44 AM    Addendum: HF rounds -new orders to hold warfarin in anticipation of possible thoracentesis or other invasive procedures.  Sheppard Coil PharmD., BCPS Clinical Pharmacist Pager [phone] 06/25/2012 10:36 AM

## 2012-06-26 ENCOUNTER — Inpatient Hospital Stay (HOSPITAL_COMMUNITY): Payer: Medicare Other

## 2012-06-26 DIAGNOSIS — R404 Transient alteration of awareness: Secondary | ICD-10-CM

## 2012-06-26 DIAGNOSIS — J189 Pneumonia, unspecified organism: Secondary | ICD-10-CM

## 2012-06-26 DIAGNOSIS — R410 Disorientation, unspecified: Secondary | ICD-10-CM | POA: Diagnosis present

## 2012-06-26 DIAGNOSIS — R0902 Hypoxemia: Secondary | ICD-10-CM

## 2012-06-26 LAB — RPR: RPR Ser Ql: NONREACTIVE

## 2012-06-26 LAB — GLUCOSE, CAPILLARY
Glucose-Capillary: 119 mg/dL — ABNORMAL HIGH (ref 70–99)
Glucose-Capillary: 214 mg/dL — ABNORMAL HIGH (ref 70–99)

## 2012-06-26 LAB — URINE MICROSCOPIC-ADD ON

## 2012-06-26 LAB — LIPID PANEL
HDL: 26 mg/dL — ABNORMAL LOW (ref >39)
LDL Cholesterol: 89 mg/dL (ref 0–99)

## 2012-06-26 LAB — URINALYSIS, ROUTINE W REFLEX MICROSCOPIC
Hgb urine dipstick: NEGATIVE
Specific Gravity, Urine: 1.015 (ref 1.005–1.030)
Urobilinogen, UA: 1 mg/dL (ref 0.0–1.0)

## 2012-06-26 LAB — APTT: aPTT: 57 seconds — ABNORMAL HIGH (ref 24–37)

## 2012-06-26 LAB — PROTIME-INR
INR: 2.04 — ABNORMAL HIGH (ref 0.00–1.49)
Prothrombin Time: 22.2 s — ABNORMAL HIGH (ref 11.6–15.2)

## 2012-06-26 LAB — BASIC METABOLIC PANEL
Calcium: 9 mg/dL (ref 8.4–10.5)
Creatinine, Ser: 2.1 mg/dL — ABNORMAL HIGH (ref 0.50–1.35)
GFR calc Af Amer: 31 mL/min — ABNORMAL LOW (ref >90)
GFR calc non Af Amer: 27 mL/min — ABNORMAL LOW (ref >90)

## 2012-06-26 MED ORDER — POTASSIUM CHLORIDE CRYS ER 20 MEQ PO TBCR
40.0000 meq | EXTENDED_RELEASE_TABLET | Freq: Once | ORAL | Status: AC
  Administered 2012-06-26: 40 meq via ORAL
  Filled 2012-06-26: qty 2

## 2012-06-26 MED ORDER — FUROSEMIDE 10 MG/ML IJ SOLN: 40.0000 mg | Freq: Two times a day (BID) | INTRAMUSCULAR | Status: DC

## 2012-06-26 MED ORDER — FUROSEMIDE 10 MG/ML IJ SOLN
40.0000 mg | Freq: Two times a day (BID) | INTRAMUSCULAR | Status: DC
  Administered 2012-06-26 – 2012-06-28 (×5): 40 mg via INTRAVENOUS
  Filled 2012-06-26 (×7): qty 4

## 2012-06-26 NOTE — Progress Notes (Signed)
[facility] PROGRESS NOTE  ADRICK KESTLER WUJ:[id number] DOB: 08-23-1922 DOA: 06/22/2012 PCP: Junious Dresser, MD  Brief narrative;  79 y/o male with CHF ( follows with heart failure clinic), CAD s/p CABG, paroxysmal a fib on coumadin, CVA, DM, HTN, CKD, was admitted for worsening SOB , fever and some confusion with findings of pneumonia and CHF.   Assessment/Plan:   HCAP  On empiric coverage with vanco and aztreonam since admission No blood cx sent on admission  Tylenol prn for fever . Remains afebrile and leukocytosis has resolved. Repeat chest x-ray however still shows left-sided consolidation with effusion. Pulmonary consulted for diagnostic and therapeutic thoracentesis. Held given elevated INR. wil reassess tomorrow.  chronic diastolic CHF .  Elevated pro BNP and pulmonary vascular congestion on CXR  diuresing with IV lasix 40 mg bid. Increased dose  to 80 mg twice a day today. On 80 mg po daily at home.  Hold ARB given renal insufficiency. No recent baseline renal fn known. Added metoprolol and hydralazine  Monitor strict I/O and daily weight  Appreciate cardiology evaluation.  2D echo results show severely reduced EF of 30-35%. Comments on possible vegetation on aortic valve. Discussed with cardiology. Low EF appears to be new. Recommend to follow with increased diuresis.   CKD  no recent baseline. As per son renal function has been worsening. Continue lasix for now and monitor. Hold ARB .  DM  continue SSI. Patient on quite high dose of lantus ( 60 units bid). reduced to 40 bid but FSG still low yesterdau  A1C 6.6. Will hold lantus for now and continue only SSI.   AMS  Present on admission ad presumed due to pneumonia. Had a code stroke called on 5/12. No  focal neurological deficit. seemS to have word finding difficulty with delirium. Head CT and MRI brain negative. Appreciate neuro recommendations. It appears patient has some underlying dementia which has been acute  worsened with change in environment and underlying infection. Check TSH, B12 and RPR.  Added bedtime seroquel for agitation.   afib  rate controlled. On coumadin . Holding dose given possible thoracentesis. SCD ordered . INR therapeutic.  Hypertension  continue lasix, zaroxolyn, , increased dose of amlod. added low dose BB.   hypothyroidism  Continue synthroid   Code Status: DNR   Family Communication:discussed with son and updated   Disposition Plan: PT Eval   Consultants:  Dr Clarise Cruz  Pulmonary   Procedures:  None. Possible left thoracentesis if left pleural effusion not improved with diuresis   Antibiotics:  IV vanco and aztreonam ( 5/9>>)   HPI/Subjective:  Patient seen and examined this morning. appears quite confused.   Objective: Filed Vitals:   06/25/12 2340 06/25/12 2343 06/26/12 0415 06/26/12 0512  BP: 139/83 139/83 138/71 135/62  Pulse:   91 89  Temp:   97.5 F (36.4 C) 98.6 F (37 C)  TempSrc:   Oral Oral  Resp:   20 16  Height:      Weight:   92.9 kg (204 lb 12.9 oz)   SpO2:   97% 95%    Intake/Output Summary (Last 24 hours) at 06/26/12 1337 Last data filed at 06/26/12 1119  Gross per 24 hour  Intake    570 ml  Output   1500 ml  Net   -930 ml   Filed Weights   06/24/12 0456 06/25/12 0441 06/26/12 0415  Weight: 95.4 kg (210 lb 5.1 oz) 96.2 kg (212 lb 1.3 oz) 92.9 kg (204 lb  12.9 oz)    Exam:  General: Elderly male lying in bed in NAD, confused  HEENT: no pallor, moist mucosa  Chest: clear breath sounds b/l Cardiovascular: NS1&S2, no murmurs  Abd: soft, NT, ND, BS+  EXT: trace  pitting edema b/l  CNS: AAOX2, pleasantly confused   Data Reviewed: Basic Metabolic Panel:  Recent Labs Lab 06/22/12 1907 06/23/12 0530 06/24/12 0434 06/25/12 1059 06/26/12 0330  NA 139 139 138 136 137  K 3.9 3.2* 3.5 3.7 3.5  CL 101 101 98 98 99  CO2 26 29 27 25 27   GLUCOSE 85 57* 77 94 119*  BUN 26* 27* 32* 34* 36*  CREATININE 2.08* 2.04*  2.08* 1.86* 2.10*  CALCIUM 9.0 8.6 9.0 9.3 9.0   Liver Function Tests:  Recent Labs Lab 06/25/12 1059  AST 17  ALT 10  ALKPHOS 66  BILITOT 0.6  PROT 7.0  ALBUMIN 3.0*   No results found for this basename: LIPASE, AMYLASE,  in the last 168 hours No results found for this basename: AMMONIA,  in the last 168 hours CBC:  Recent Labs Lab 06/22/12 1907 06/23/12 0530 06/24/12 0434  WBC 12.4* 11.0* 8.6  NEUTROABS 10.3*  --   --   HGB 13.3 12.6* 12.6*  HCT 40.6 39.0 39.4  MCV 81.9 81.9 83.7  PLT 140* 136* 145*   Cardiac Enzymes: No results found for this basename: CKTOTAL, CKMB, CKMBINDEX, TROPONINI,  in the last 168 hours BNP (last 3 results)  Recent Labs  06/22/12 1907  PROBNP 4692.0*   CBG:  Recent Labs Lab 06/25/12 1136 06/25/12 1634 06/25/12 1736 06/25/12 2305 06/26/12 0539  GLUCAP 101* 84 78 127* 119*    Recent Results (from the past 240 hour(s))  URINE CULTURE     Status: None   Collection Time    06/22/12  9:39 PM      Result Value Range Status   Specimen Description URINE, CLEAN CATCH   Final   Special Requests NONE   Final   Culture  Setup Time 06/22/2012 23:25   Final   Colony Count NO GROWTH   Final   Culture NO GROWTH   Final   Report Status 06/24/2012 FINAL   Final  MRSA PCR SCREENING     Status: None   Collection Time    06/22/12 11:04 PM      Result Value Range Status   MRSA by PCR NEGATIVE  NEGATIVE Final   Comment:            The GeneXpert MRSA Assay (FDA     approved for NASAL specimens     only), is one component of a     comprehensive MRSA colonization     surveillance program. It is not     intended to diagnose MRSA     infection nor to guide or     monitor treatment for     MRSA infections.     Studies: Dg Chest 2 View  06/25/2012  *RADIOLOGY REPORT*  Clinical Data: Shortness of breath.  Left pleural effusion.  CHEST - 2 VIEW  Comparison: 06/22/2012  Findings: AP and lateral views of the chest show cardiomegaly with  vascular congestion.  Lung volumes remain low.  Asymmetric elevation left hemidiaphragm noted with left base collapse / consolidation.  There is associated left pleural effusion. Telemetry leads overlie the chest. Bones are diffusely demineralized.  Degenerative changes are noted in the left shoulder.  IMPRESSION: Stable.  Low volume film with  cardiomegaly and vascular congestion.  Left base collapse / consolidation with effusion.   Original Report Authenticated By: Kennith Center, M.D.    Ct Head Wo Contrast  06/25/2012  *RADIOLOGY REPORT*  Clinical Data: Code stroke, a fascia.  CT HEAD WITHOUT CONTRAST  Technique:  Contiguous axial images were obtained from the base of the skull through the vertex without contrast.  Comparison: 06/23/2012  Findings: There is atrophy and chronic small vessel disease changes.  Old lacunar infarct in the left basal ganglia. No acute intracranial abnormality.  Specifically, no hemorrhage, hydrocephalus, mass lesion, acute infarction, or significant intracranial injury.  No acute calvarial abnormality.  Extensive paranasal sinus disease throughout the paranasal sinuses. Mastoids are clear.  IMPRESSION: No acute intracranial abnormality.  Atrophy, chronic microvascular disease.  Severe diffuse paranasal sinus disease.  Critical Value/emergent results were called by telephone at the time of interpretation on 06/25/2012 at 6:09 p.m. to Dr. Thad Ranger, who verbally acknowledged these results.   Original Report Authenticated By: Charlett Nose, M.D.    Mr Brain Wo Contrast  06/26/2012  *RADIOLOGY REPORT*  Clinical Data: Diabetic hypertensive patient with confusion and slurred speech.  MRI HEAD WITHOUT CONTRAST  Technique:  Multiplanar, multiecho pulse sequences of the brain and surrounding structures were obtained according to standard protocol without intravenous contrast.  Comparison: 06/25/2012 CT.  No comparison MR.  Findings: No acute infarct.  No intracranial hemorrhage.  Remote  bilateral basal ganglia infarcts.  Prominent small vessel disease type changes.  Global atrophy without hydrocephalus.  No intracranial mass lesion detected on this unenhanced exam.  Major intracranial vascular structures are patent.  Ectatic intracranial vasculature with atherosclerotic type changes.  Marked paranasal sinus opacification/mucosal thickening.  Partial opacification mastoid air cells and middle ear cavities bilaterally.  Dens fracture has not healed (noted on 05/04/2009 CT).  IMPRESSION: No acute infarct.  No intracranial hemorrhage.  Remote bilateral basal ganglia infarcts.  Prominent small vessel disease type changes.  Global atrophy without hydrocephalus.  Marked paranasal sinus opacification/mucosal thickening.  Partial opacification mastoid air cells and middle ear cavities bilaterally.  Dens fracture has not healed (noted on 05/04/2009 CT).   Original Report Authenticated By: Lacy Duverney, M.D.    Dg Chest Port 1 View  06/26/2012  *RADIOLOGY REPORT*  Clinical Data: Shortness of breath and pleural effusion.  PORTABLE CHEST - 1 VIEW  Comparison: 06/25/2012  Findings: There is relatively stable left lower lobe consolidation with probable associated left pleural effusion.  Venous hypertensive changes noted without overt airspace edema.  Heart size is stable.  IMPRESSION: Stable left lower lobe consolidation with probable component of left pleural fluid.   Original Report Authenticated By: Irish Lack, M.D.     Scheduled Meds: . aspirin EC  325 mg Oral Daily  . atorvastatin  80 mg Oral Daily  . aztreonam  1 g Intravenous Q8H  . hydrALAZINE  25 mg Oral Q8H  . insulin aspart  0-5 Units Subcutaneous QHS  . insulin aspart  0-9 Units Subcutaneous TID WC  . isosorbide mononitrate  30 mg Oral Daily  . levothyroxine  50 mcg Oral QAC breakfast  . metoprolol tartrate  12.5 mg Oral BID  . multivitamin with minerals  1 tablet Oral Daily  . pantoprazole  40 mg Oral Daily  . potassium chloride  SA  20 mEq Oral Daily  . QUEtiapine  25 mg Oral QHS  . senna-docusate  1 tablet Oral BID  . vancomycin  1,000 mg Intravenous Q24H   Continuous Infusions: .  sodium chloride 10 mL/hr at 06/22/12 2354      Time spent: 25 minutes    Eddie North  [facility] Pager [phone] If 7PM-7AM, please contact night-coverage at [email or web address], password [facility] 06/26/2012, 1:37 PM  LOS: 4 days

## 2012-06-26 NOTE — Progress Notes (Signed)
ANTIBIOTIC CONSULT NOTE - Follow up  Pharmacy Consult for Vancomycin/aztreonam Indication: rule out pneumonia  Allergies  Allergen Reactions  . Heparin Other (See Comments)    HIT  . Moxifloxacin Other (See Comments)    Per MAR  . Penicillins Other (See Comments)    Per [facility]    Patient Measurements: Height: 5\' 10"  (177.8 cm) Weight: 204 lb 12.9 oz (92.9 kg) (bedscale) IBW/kg (Calculated) : 73  Vital Signs: Temp: 98.8 F (37.1 C) (05/13 2150) Temp src: Oral (05/13 2150) BP: 144/94 mmHg (05/13 2150) Pulse Rate: 95 (05/13 2150) Intake/Output from previous day: 05/12 0701 - 05/13 0700 In: 750 [P.O.:400; IV Piggyback:350] Out: 1025 [Urine:1025] Intake/Output from this shift:    Labs:  Recent Labs  06/24/12 0434 06/25/12 1059 06/26/12 0330  WBC 8.6  --   --   HGB 12.6*  --   --   PLT 145*  --   --   CREATININE 2.08* 1.86* 2.10*   Estimated Creatinine Clearance: 28.4 ml/min (by C-G formula based on Cr of 2.1).  Recent Labs  06/26/12 2110  VANCOTROUGH 16.4     Microbiology: Recent Results (from the past 720 hour(s))  URINE CULTURE     Status: None   Collection Time    06/22/12  9:39 PM      Result Value Range Status   Specimen Description URINE, CLEAN CATCH   Final   Special Requests NONE   Final   Culture  Setup Time 06/22/2012 23:25   Final   Colony Count NO GROWTH   Final   Culture NO GROWTH   Final   Report Status 06/24/2012 FINAL   Final  MRSA PCR SCREENING     Status: None   Collection Time    06/22/12 11:04 PM      Result Value Range Status   MRSA by PCR NEGATIVE  NEGATIVE Final   Comment:            The GeneXpert MRSA Assay (FDA     approved for NASAL specimens     only), is one component of a     comprehensive MRSA colonization     surveillance program. It is not     intended to diagnose MRSA     infection nor to guide or     monitor treatment for     MRSA infections.    Medical History: Past Medical History  Diagnosis Date  . CHF  (congestive heart failure)     secondary to diastolic function. Echo 4/09: EF 55%  . CAD (coronary artery disease)     status post CABG in 1996  . Paroxysmal atrial fibrillation   . Diabetes mellitus     type was not specified  . HTN (hypertension)   . Hyperlipidemia   . Chronic renal insufficiency     with a base line creatinine of 1.6  . Cerebrovascular accident     Hx of it, Left sided facial droop  . Depression   . Carotid bruit     s/p carotids in 2006, showed mild hard plaque in the bulbs and proximal bifurcations, left greater that right. Both ICAs take steep posterior dives. Right ICA 0-39% stenosis; left ICA 40-59% stenosis.  Carrotids 9/20: 0-39%  . BPH (benign prostatic hyperplasia)     Hx of it.  . S/P cholecystectomy   . Fall     C2 fracture/ 12/15/08  . Dementia    Assessment: 79 y/o male patient presented with fever  and productive cough and started on vancomycin and azactam (patient with penicillin/avelox allergy) for empiric treatment of pneumonia. Now receiving day #4 of treatment. Patient remains afebrile, wbc wnl , renal fxn stable, and all cx ngtd. Obtained trough today, which is at goal. Continue same regimen.  Goal of Therapy:  Vancomycin trough level 15-20 mcg/ml  Plan:  Continue vanc 1g q24 and azactam 1g iv q8h, will continue to monitor.  Verlene Mayer, PharmD, BCPS Pager [phone]  06/26/2012,10:16 PM

## 2012-06-26 NOTE — Progress Notes (Signed)
Subjective: Patient with no complaints.  Spoke with som who feels there is some confusion but patient improved from yesterday.  MRI of the brain reviewed and shows no acute changes.    Objective: Current vital signs: BP 135/62  Pulse 89  Temp(Src) 98.6 F (37 C) (Oral)  Resp 16  Ht 5\' 10"  (1.778 m)  Wt 92.9 kg (204 lb 12.9 oz)  BMI 29.39 kg/m2  SpO2 95% Vital signs in last 24 hours: Temp:  [97.5 F (36.4 C)-98.9 F (37.2 C)] 98.6 F (37 C) (05/13 0512) Pulse Rate:  [89-91] 89 (05/13 0512) Resp:  [16-20] 16 (05/13 0512) BP: (101-139)/(62-83) 135/62 mmHg (05/13 0512) SpO2:  [90 %-97 %] 95 % (05/13 0512) Weight:  [92.9 kg (204 lb 12.9 oz)] 92.9 kg (204 lb 12.9 oz) (05/13 0415)  Intake/Output from previous day: 05/12 0701 - 05/13 0700 In: 750 [P.O.:400; IV Piggyback:350] Out: 1025 [Urine:1025] Intake/Output this shift: Total I/O In: 120 [P.O.:120] Out: 475 [Urine:475] Nutritional status: Carb Control  Neurologic Exam: Mental Status:  Alert. Follows commands. Speech fluent.  Oriented to place and year.   Cranial Nerves:  II: Discs flat bilaterally; Visual fields grossly normal, pupils equal, round, reactive to light and accommodation  III,IV, VI: ptosis not present, extra-ocular motions intact bilaterally  V,VII: smile symmetric, facial light touch sensation normal bilaterally  VIII: hearing normal bilaterally  IX,X: gag reflex present  XI: bilateral shoulder shrug  XII: midline tongue extension  Motor:  Right : Upper extremity 5/5         Left: Upper extremity 5/5   Lower extremity 5/5       Lower extremity 5/5  Tone and bulk:normal tone throughout; no atrophy noted  Sensory: Pinprick and light touch intact throughout, bilaterally  Deep Tendon Reflexes: 2+ and symmetric with absent AJ's bilaterally  Plantars:  Right: upgoing Left: downgoing  Cerebellar:  normal finger-to-nose and normal heel-to-shin test  Gait: Unable to test  CV: pulses palpable throughout     Lab Results: Basic Metabolic Panel:  Recent Labs Lab 06/22/12 1907 06/23/12 0530 06/24/12 0434 06/25/12 1059 06/26/12 0330  NA 139 139 138 136 137  K 3.9 3.2* 3.5 3.7 3.5  CL 101 101 98 98 99  CO2 26 29 27 25 27   GLUCOSE 85 57* 77 94 119*  BUN 26* 27* 32* 34* 36*  CREATININE 2.08* 2.04* 2.08* 1.86* 2.10*  CALCIUM 9.0 8.6 9.0 9.3 9.0    Liver Function Tests:  Recent Labs Lab 06/25/12 1059  AST 17  ALT 10  ALKPHOS 66  BILITOT 0.6  PROT 7.0  ALBUMIN 3.0*   No results found for this basename: LIPASE, AMYLASE,  in the last 168 hours No results found for this basename: AMMONIA,  in the last 168 hours  CBC:  Recent Labs Lab 06/22/12 1907 06/23/12 0530 06/24/12 0434  WBC 12.4* 11.0* 8.6  NEUTROABS 10.3*  --   --   HGB 13.3 12.6* 12.6*  HCT 40.6 39.0 39.4  MCV 81.9 81.9 83.7  PLT 140* 136* 145*    Cardiac Enzymes: No results found for this basename: CKTOTAL, CKMB, CKMBINDEX, TROPONINI,  in the last 168 hours  Lipid Panel:  Recent Labs Lab 06/26/12 0442  CHOL 141  TRIG 128  HDL 26*  CHOLHDL 5.4  VLDL 26  LDLCALC 89    CBG:  Recent Labs Lab 06/25/12 1136 06/25/12 1634 06/25/12 1736 06/25/12 2305 06/26/12 0539  GLUCAP 101* 84 78 127* 119*    Microbiology:  Results for orders placed during the hospital encounter of 06/22/12  URINE CULTURE     Status: None   Collection Time    06/22/12  9:39 PM      Result Value Range Status   Specimen Description URINE, CLEAN CATCH   Final   Special Requests NONE   Final   Culture  Setup Time 06/22/2012 23:25   Final   Colony Count NO GROWTH   Final   Culture NO GROWTH   Final   Report Status 06/24/2012 FINAL   Final  MRSA PCR SCREENING     Status: None   Collection Time    06/22/12 11:04 PM      Result Value Range Status   MRSA by PCR NEGATIVE  NEGATIVE Final   Comment:            The GeneXpert MRSA Assay (FDA     approved for NASAL specimens     only), is one component of a     comprehensive  MRSA colonization     surveillance program. It is not     intended to diagnose MRSA     infection nor to guide or     monitor treatment for     MRSA infections.    Coagulation Studies:  Recent Labs  06/24/12 0434 06/25/12 0450 06/26/12 0330  LABPROT 17.8* 20.1* 22.2*  INR 1.51* 1.78* 2.04*    Imaging: Dg Chest 2 View  06/25/2012  *RADIOLOGY REPORT*  Clinical Data: Shortness of breath.  Left pleural effusion.  CHEST - 2 VIEW  Comparison: 06/22/2012  Findings: AP and lateral views of the chest show cardiomegaly with vascular congestion.  Lung volumes remain low.  Asymmetric elevation left hemidiaphragm noted with left base collapse / consolidation.  There is associated left pleural effusion. Telemetry leads overlie the chest. Bones are diffusely demineralized.  Degenerative changes are noted in the left shoulder.  IMPRESSION: Stable.  Low volume film with cardiomegaly and vascular congestion.  Left base collapse / consolidation with effusion.   Original Report Authenticated By: Kennith Center, M.D.    Ct Head Wo Contrast  06/25/2012  *RADIOLOGY REPORT*  Clinical Data: Code stroke, a fascia.  CT HEAD WITHOUT CONTRAST  Technique:  Contiguous axial images were obtained from the base of the skull through the vertex without contrast.  Comparison: 06/23/2012  Findings: There is atrophy and chronic small vessel disease changes.  Old lacunar infarct in the left basal ganglia. No acute intracranial abnormality.  Specifically, no hemorrhage, hydrocephalus, mass lesion, acute infarction, or significant intracranial injury.  No acute calvarial abnormality.  Extensive paranasal sinus disease throughout the paranasal sinuses. Mastoids are clear.  IMPRESSION: No acute intracranial abnormality.  Atrophy, chronic microvascular disease.  Severe diffuse paranasal sinus disease.  Critical Value/emergent results were called by telephone at the time of interpretation on 06/25/2012 at 6:09 p.m. to Dr. Thad Ranger, who  verbally acknowledged these results.   Original Report Authenticated By: Charlett Nose, M.D.    Mr Brain Wo Contrast  06/26/2012  *RADIOLOGY REPORT*  Clinical Data: Diabetic hypertensive patient with confusion and slurred speech.  MRI HEAD WITHOUT CONTRAST  Technique:  Multiplanar, multiecho pulse sequences of the brain and surrounding structures were obtained according to standard protocol without intravenous contrast.  Comparison: 06/25/2012 CT.  No comparison MR.  Findings: No acute infarct.  No intracranial hemorrhage.  Remote bilateral basal ganglia infarcts.  Prominent small vessel disease type changes.  Global atrophy without hydrocephalus.  No intracranial mass lesion  detected on this unenhanced exam.  Major intracranial vascular structures are patent.  Ectatic intracranial vasculature with atherosclerotic type changes.  Marked paranasal sinus opacification/mucosal thickening.  Partial opacification mastoid air cells and middle ear cavities bilaterally.  Dens fracture has not healed (noted on 05/04/2009 CT).  IMPRESSION: No acute infarct.  No intracranial hemorrhage.  Remote bilateral basal ganglia infarcts.  Prominent small vessel disease type changes.  Global atrophy without hydrocephalus.  Marked paranasal sinus opacification/mucosal thickening.  Partial opacification mastoid air cells and middle ear cavities bilaterally.  Dens fracture has not healed (noted on 05/04/2009 CT).   Original Report Authenticated By: Lacy Duverney, M.D.    Dg Chest Port 1 View  06/26/2012  *RADIOLOGY REPORT*  Clinical Data: Shortness of breath and pleural effusion.  PORTABLE CHEST - 1 VIEW  Comparison: 06/25/2012  Findings: There is relatively stable left lower lobe consolidation with probable associated left pleural effusion.  Venous hypertensive changes noted without overt airspace edema.  Heart size is stable.  IMPRESSION: Stable left lower lobe consolidation with probable component of left pleural fluid.   Original  Report Authenticated By: Irish Lack, M.D.     Medications:  I have reviewed the patient's current medications. Scheduled: . aspirin EC  325 mg Oral Daily  . atorvastatin  80 mg Oral Daily  . aztreonam  1 g Intravenous Q8H  . furosemide  40 mg Intravenous BID  . hydrALAZINE  25 mg Oral Q8H  . insulin aspart  0-5 Units Subcutaneous QHS  . insulin aspart  0-9 Units Subcutaneous TID WC  . isosorbide mononitrate  30 mg Oral Daily  . levothyroxine  50 mcg Oral QAC breakfast  . metoprolol tartrate  12.5 mg Oral BID  . multivitamin with minerals  1 tablet Oral Daily  . pantoprazole  40 mg Oral Daily  . potassium chloride SA  20 mEq Oral Daily  . potassium chloride  40 mEq Oral Once  . QUEtiapine  25 mg Oral QHS  . senna-docusate  1 tablet Oral BID  . vancomycin  1,000 mg Intravenous Q24H    Assessment/Plan: Patient improved today.  No evidence of acute infarct on imaging.  On ASA.  Recommendations: 1.  No further neurologic intervention is recommended at this time.  If further questions arise, please call or page at that time.  Thank you for allowing neurology to participate in the care of this patient.   LOS: 4 days   Thana Farr, MD [facility] [phone] 06/26/2012  2:19 PM

## 2012-06-26 NOTE — Progress Notes (Signed)
Wayne Montoya seems to be doing much better. His speech and language are appropriate at this time and his neurological examination is unchanged. Awaiting MRI brain. Neurology to follow up in the morning.  Wayne Montoya ,MD [facility]

## 2012-06-26 NOTE — Progress Notes (Addendum)
Advanced Heart Failure Rounding Note   Subjective:    [name] is a 79 year old white male patient with atrial fibrillation, HIT, HTN, previous CVA, PUD, CRI (1.5-2.0) and CHF secondary to diastolic dysfunction. He has CAD and is s/p CABG in 1999. 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  Admitted with fevers, sob and volume overload. Found to have PNA and mild HF.   CXR with large L effusion and possible infiltrate. Echo showed new systolic HF with EF 30-35%, mod LVH with moderate concentric hypertrophy. LA mod dilated. RA mildly dilated.    Last night got delirious and code stroke called. W/u non-focal. Continues to diurese well. Weight down 10 pounds. Renal function at baseline. Remains on RA. P/CCM considering thoracentesis when his mental status improves.   Much clearer today. Feels "terrible". Breathing Ok. On 2L. No fevers   Objective:   Weight Range:  Vital Signs:   Temp:  [97.5 F (36.4 C)-98.9 F (37.2 C)] 98.6 F (37 C) (05/13 0512) Pulse Rate:  [81-91] 89 (05/13 0512) Resp:  [16-20] 16 (05/13 0512) BP: (101-139)/(62-83) 135/62 mmHg (05/13 0512) SpO2:  [90 %-97 %] 95 % (05/13 0512) Weight:  [92.9 kg (204 lb 12.9 oz)] 92.9 kg (204 lb 12.9 oz) (05/13 0415) Last BM Date: 06/21/12  Weight change: Filed Weights   06/24/12 0456 06/25/12 0441 06/26/12 0415  Weight: 95.4 kg (210 lb 5.1 oz) 96.2 kg (212 lb 1.3 oz) 92.9 kg (204 lb 12.9 oz)    Intake/Output:   Intake/Output Summary (Last 24 hours) at 06/26/12 1335 Last data filed at 06/26/12 1119  Gross per 24 hour  Intake    570 ml  Output   1500 ml  Net   -930 ml     Physical Exam: General: Elderly. Lying in bed No resp difficulty.  HEENT: normal  Neck: supple. JVP to jaw . Carotids 2+ bilat; no bruits. No lymphadenopathy or thryomegaly appreciated.  Cor: PMI nonpalpable. Distant HS Irregular rate & rhythm. No rubs, gallops or murmurs.  Lungs: clear. Decreased L base.   Abdomen: soft, nontender, nondistended. No hepatosplenomegaly. No bruits or masses. Good bowel sounds.  Extremities: no cyanosis, clubbing, rash, edema  Neuro: alert. Conversant moves all 4 extremities w/o difficulty.  Telemetry: = AF 100  Labs: Basic Metabolic Panel:  Recent Labs Lab 06/22/12 1907 06/23/12 0530 06/24/12 0434 06/25/12 1059 06/26/12 0330  NA 139 139 138 136 137  K 3.9 3.2* 3.5 3.7 3.5  CL 101 101 98 98 99  CO2 26 29 27 25 27   GLUCOSE 85 57* 77 94 119*  BUN 26* 27* 32* 34* 36*  CREATININE 2.08* 2.04* 2.08* 1.86* 2.10*  CALCIUM 9.0 8.6 9.0 9.3 9.0    Liver Function Tests:  Recent Labs Lab 06/25/12 1059  AST 17  ALT 10  ALKPHOS 66  BILITOT 0.6  PROT 7.0  ALBUMIN 3.0*   No results found for this basename: LIPASE, AMYLASE,  in the last 168 hours No results found for this basename: AMMONIA,  in the last 168 hours  CBC:  Recent Labs Lab 06/22/12 1907 06/23/12 0530 06/24/12 0434  WBC 12.4* 11.0* 8.6  NEUTROABS 10.3*  --   --   HGB 13.3 12.6* 12.6*  HCT 40.6 39.0 39.4  MCV 81.9 81.9 83.7  PLT 140* 136* 145*    Cardiac Enzymes: No results found for this basename: CKTOTAL, CKMB, CKMBINDEX, TROPONINI,  in the last 168 hours  BNP: BNP (last  3 results)  Recent Labs  06/22/12 1907  PROBNP 4692.0*     Imaging: Dg Chest 2 View  06/25/2012  *RADIOLOGY REPORT*  Clinical Data: Shortness of breath.  Left pleural effusion.  CHEST - 2 VIEW  Comparison: 06/22/2012  Findings: AP and lateral views of the chest show cardiomegaly with vascular congestion.  Lung volumes remain low.  Asymmetric elevation left hemidiaphragm noted with left base collapse / consolidation.  There is associated left pleural effusion. Telemetry leads overlie the chest. Bones are diffusely demineralized.  Degenerative changes are noted in the left shoulder.  IMPRESSION: Stable.  Low volume film with cardiomegaly and vascular congestion.  Left base collapse / consolidation with  effusion.   Original Report Authenticated By: Kennith Center, M.D.    Ct Head Wo Contrast  06/25/2012  *RADIOLOGY REPORT*  Clinical Data: Code stroke, a fascia.  CT HEAD WITHOUT CONTRAST  Technique:  Contiguous axial images were obtained from the base of the skull through the vertex without contrast.  Comparison: 06/23/2012  Findings: There is atrophy and chronic small vessel disease changes.  Old lacunar infarct in the left basal ganglia. No acute intracranial abnormality.  Specifically, no hemorrhage, hydrocephalus, mass lesion, acute infarction, or significant intracranial injury.  No acute calvarial abnormality.  Extensive paranasal sinus disease throughout the paranasal sinuses. Mastoids are clear.  IMPRESSION: No acute intracranial abnormality.  Atrophy, chronic microvascular disease.  Severe diffuse paranasal sinus disease.  Critical Value/emergent results were called by telephone at the time of interpretation on 06/25/2012 at 6:09 p.m. to Dr. Thad Ranger, who verbally acknowledged these results.   Original Report Authenticated By: Charlett Nose, M.D.    Mr Brain Wo Contrast  06/26/2012  *RADIOLOGY REPORT*  Clinical Data: Diabetic hypertensive patient with confusion and slurred speech.  MRI HEAD WITHOUT CONTRAST  Technique:  Multiplanar, multiecho pulse sequences of the brain and surrounding structures were obtained according to standard protocol without intravenous contrast.  Comparison: 06/25/2012 CT.  No comparison MR.  Findings: No acute infarct.  No intracranial hemorrhage.  Remote bilateral basal ganglia infarcts.  Prominent small vessel disease type changes.  Global atrophy without hydrocephalus.  No intracranial mass lesion detected on this unenhanced exam.  Major intracranial vascular structures are patent.  Ectatic intracranial vasculature with atherosclerotic type changes.  Marked paranasal sinus opacification/mucosal thickening.  Partial opacification mastoid air cells and middle ear cavities  bilaterally.  Dens fracture has not healed (noted on 05/04/2009 CT).  IMPRESSION: No acute infarct.  No intracranial hemorrhage.  Remote bilateral basal ganglia infarcts.  Prominent small vessel disease type changes.  Global atrophy without hydrocephalus.  Marked paranasal sinus opacification/mucosal thickening.  Partial opacification mastoid air cells and middle ear cavities bilaterally.  Dens fracture has not healed (noted on 05/04/2009 CT).   Original Report Authenticated By: Lacy Duverney, M.D.    Dg Chest Port 1 View  06/26/2012  *RADIOLOGY REPORT*  Clinical Data: Shortness of breath and pleural effusion.  PORTABLE CHEST - 1 VIEW  Comparison: 06/25/2012  Findings: There is relatively stable left lower lobe consolidation with probable associated left pleural effusion.  Venous hypertensive changes noted without overt airspace edema.  Heart size is stable.  IMPRESSION: Stable left lower lobe consolidation with probable component of left pleural fluid.   Original Report Authenticated By: Irish Lack, M.D.      Medications:     Scheduled Medications: . aspirin EC  325 mg Oral Daily  . atorvastatin  80 mg Oral Daily  . aztreonam  1 g  Intravenous Q8H  . hydrALAZINE  25 mg Oral Q8H  . insulin aspart  0-5 Units Subcutaneous QHS  . insulin aspart  0-9 Units Subcutaneous TID WC  . isosorbide mononitrate  30 mg Oral Daily  . levothyroxine  50 mcg Oral QAC breakfast  . metoprolol tartrate  12.5 mg Oral BID  . multivitamin with minerals  1 tablet Oral Daily  . pantoprazole  40 mg Oral Daily  . potassium chloride SA  20 mEq Oral Daily  . QUEtiapine  25 mg Oral QHS  . senna-docusate  1 tablet Oral BID  . vancomycin  1,000 mg Intravenous Q24H    Infusions: . sodium chloride 10 mL/hr at 06/22/12 2354    PRN Medications: acetaminophen, acetaminophen, alum & mag hydroxide-simeth, hydrALAZINE, HYDROmorphone (DILAUDID) injection, metolazone, ondansetron (ZOFRAN) IV, ondansetron, oxyCODONE,  zolpidem   Assessment:   1. Healthcare associated (SNF) acquired PNA  2. L pleural effusion - ?parapneumonic  3. Acute systolic HF - EF 16-10%  4. Chronic diastolic HF  5. Chronic AF on coumadin  6. Chronic renal failure  7. CAD s/p CABG  8. DM2  9. Dementia, early  55. DNR/DNI  11. H/o HIT 12. Acute delirium 13. Hypokalemia  Plan/Discussion:     Much less delirious today. Volume status improving. Renal function stable. Will continue IV lasix at least one more day. Pulmonary following for thoracentesis when INR down. Appreciate their input. No ACE-I or b-blocker yet due to renal failure and lung disease. supp K+.   Roque Schill,MD 1:40 PM  Length of Stay: 4  Advanced Heart Failure Team Pager [phone] (M-F; 7a - 4p)  Please contact [facility] for night-coverage after hours (4p -7a ) and weekends on [email or web address]

## 2012-06-26 NOTE — Consult Note (Signed)
PULMONARY  / CRITICAL CARE MEDICINE  Name: Wayne MCCLANAHAN MRN: [id number] DOB: May 21, 1922    ADMISSION DATE:  06/22/2012 CONSULTATION DATE:  5/12  REFERRING MD :  Bensimhon PRIMARY SERVICE:  [facility]  CHIEF COMPLAINT:  Pleural effusion  BRIEF PATIENT DESCRIPTION:  79 yo male from SNF with fever, dyspnea, and confusion from HCAP, acute on chronic diastolic CHF.  Found to have Lt pleural effusion on CXR, and PCCM consulted to further assess.   SIGNIFICANT EVENTS / STUDIES:  5/11 - 2D echo>> EF 30-35%. Mod LVH, mod/severely reduced systolic function, diffuse hypokinesis, mod dilated LA  5/12 - Code stroke called in PM, sx resolving, Neurology following 5/13 - Resolved agitation, afebrile, reduced WBC  CULTURES: Urine 5/9>>> neg   ANTIBIOTICS: Aztreonam 5/9>>> Vanc 5/9>>>  SUBJECTIVE:    Code stroke called last night.  Denies chest pain.  Breathing better.  Still has cough, but not as much sputum  VITAL SIGNS: Temp:  [97.5 F (36.4 C)-98.9 F (37.2 C)] 98.6 F (37 C) (05/13 0512) Pulse Rate:  [81-91] 89 (05/13 0512) Resp:  [16-20] 16 (05/13 0512) BP: (101-139)/(62-83) 135/62 mmHg (05/13 0512) SpO2:  [90 %-97 %] 95 % (05/13 0512) Weight:  [204 lb 12.9 oz (92.9 kg)] 204 lb 12.9 oz (92.9 kg) (05/13 0415) 2 liters [place]  PHYSICAL EXAMINATION: General: No distress Neuro: Sleepy, wakes up easily, follows commands HEENT:  No sinus tenderness Cardiovascular: irregular Lungs: decreased breath sounds at bases Lt > Rt, rales at Lt base Abdomen: soft, non tender Ext: no edema   Recent Labs Lab 06/24/12 0434 06/25/12 1059 06/26/12 0330  NA 138 136 137  K 3.5 3.7 3.5  CL 98 98 99  CO2 27 25 27   BUN 32* 34* 36*  CREATININE 2.08* 1.86* 2.10*  GLUCOSE 77 94 119*    Recent Labs Lab 06/22/12 1907 06/23/12 0530 06/24/12 0434  HGB 13.3 12.6* 12.6*  HCT 40.6 39.0 39.4  WBC 12.4* 11.0* 8.6  PLT 140* 136* 145*   Dg Chest 2 View  06/25/2012  *RADIOLOGY REPORT*  Clinical Data:  Shortness of breath.  Left pleural effusion.  CHEST - 2 VIEW  Comparison: 06/22/2012  Findings: AP and lateral views of the chest show cardiomegaly with vascular congestion.  Lung volumes remain low.  Asymmetric elevation left hemidiaphragm noted with left base collapse / consolidation.  There is associated left pleural effusion. Telemetry leads overlie the chest. Bones are diffusely demineralized.  Degenerative changes are noted in the left shoulder.  IMPRESSION: Stable.  Low volume film with cardiomegaly and vascular congestion.  Left base collapse / consolidation with effusion.   Original Report Authenticated By: Kennith Center, M.D.    Ct Head Wo Contrast  06/25/2012  *RADIOLOGY REPORT*  Clinical Data: Code stroke, a fascia.  CT HEAD WITHOUT CONTRAST  Technique:  Contiguous axial images were obtained from the base of the skull through the vertex without contrast.  Comparison: 06/23/2012  Findings: There is atrophy and chronic small vessel disease changes.  Old lacunar infarct in the left basal ganglia. No acute intracranial abnormality.  Specifically, no hemorrhage, hydrocephalus, mass lesion, acute infarction, or significant intracranial injury.  No acute calvarial abnormality.  Extensive paranasal sinus disease throughout the paranasal sinuses. Mastoids are clear.  IMPRESSION: No acute intracranial abnormality.  Atrophy, chronic microvascular disease.  Severe diffuse paranasal sinus disease.  Critical Value/emergent results were called by telephone at the time of interpretation on 06/25/2012 at 6:09 p.m. to Dr. Thad Ranger, who verbally acknowledged these  results.   Original Report Authenticated By: Charlett Nose, M.D.    Dg Chest Port 1 View  06/26/2012  *RADIOLOGY REPORT*  Clinical Data: Shortness of breath and pleural effusion.  PORTABLE CHEST - 1 VIEW  Comparison: 06/25/2012  Findings: There is relatively stable left lower lobe consolidation with probable associated left pleural effusion.  Venous  hypertensive changes noted without overt airspace edema.  Heart size is stable.  IMPRESSION: Stable left lower lobe consolidation with probable component of left pleural fluid.   Original Report Authenticated By: Irish Lack, M.D.     ASSESSMENT / PLAN:  HCAP with Lt pleural effusion. Clinically improving, and has stable findings on CXR.  Family reluctant to have interventions at this time. Plan: -continue Abx per primary team -will re-assess with bedside u/s 5/14, and then decide if thoracentesis warranted -f/u CXR later this week  Acute hypoxic respiratory failure 2nd to HCAP, pleural effusion, and acute on chronic systolic/diastolic CHF. Pt is DNR/DNI. Plan: -oxygen to keep SpO2 > 92%  Acute on chronic systolic/diastolic CHF. Hx of A fib on coumadin. Plan: -per primary team and cardiology -negative fluid balance as tolerated -?if he is a good candidate for continued coumadin use  Hx of CVA, dementia with acute change in mental status 5/13. Plan: -per neurology and primary team  Canary Brim, NP-C [facility] Pgr: [phone] or [phone]  Coralyn Helling, MD [facility] Pulmonary/Critical Care 06/26/2012, 1:59 PM Pager:  [phone] After 3pm call: [phone]

## 2012-06-26 NOTE — Progress Notes (Addendum)
Pharmacy Consult Note  Pharmacy Consult for Coumadin Indication: atrial fibrillation  Pharmacy Consult for Vancomycin/aztreonam  Indication: HCAP  Allergies  Allergen Reactions  . Heparin Other (See Comments)    HIT  . Moxifloxacin Other (See Comments)    Per MAR  . Penicillins Other (See Comments)    Per [facility]    Patient Measurements: Height: 5\' 10"  (177.8 cm) Weight: 204 lb 12.9 oz (92.9 kg) (bedscale) IBW/kg (Calculated) : 73  Vital Signs: Temp: 98.6 F (37 C) (05/13 0512) Temp src: Oral (05/13 0512) BP: 135/62 mmHg (05/13 0512) Pulse Rate: 89 (05/13 0512)  Labs:  Recent Labs  06/24/12 0434 06/25/12 0450 06/25/12 1059 06/26/12 0330  HGB 12.6*  --   --   --   HCT 39.4  --   --   --   PLT 145*  --   --   --   APTT  --   --   --  57*  LABPROT 17.8* 20.1*  --  22.2*  INR 1.51* 1.78*  --  2.04*  CREATININE 2.08*  --  1.86* 2.10*    Estimated Creatinine Clearance: 28.4 ml/min (by C-G formula based on Cr of 2.1).   Medical History: Past Medical History  Diagnosis Date  . CHF (congestive heart failure)     secondary to diastolic function. Echo 4/09: EF 55%  . CAD (coronary artery disease)     status post CABG in 1996  . Paroxysmal atrial fibrillation   . Diabetes mellitus     type was not specified  . HTN (hypertension)   . Hyperlipidemia   . Chronic renal insufficiency     with a base line creatinine of 1.6  . Cerebrovascular accident     Hx of it, Left sided facial droop  . Depression   . Carotid bruit     s/p carotids in 2006, showed mild hard plaque in the bulbs and proximal bifurcations, left greater that right. Both ICAs take steep posterior dives. Right ICA 0-39% stenosis; left ICA 40-59% stenosis.  Carrotids 9/20: 0-39%  . BPH (benign prostatic hyperplasia)     Hx of it.  . S/P cholecystectomy   . Fall     C2 fracture/ 12/15/08  . Dementia     Assessment: 83 YOM with hx of afib - CVR on metoprolol, on coumadin PTA, pharmacy was consulted  to dose coumadin but now the medication is on hold for possible procedure. INR further elevated this morning at 2.0 this morning. No cbc today but has been stable thus far, no bleeding noted.   PTA dose 5mg /7.5mg  alternating every other day.   HCAP: patient continues on broad antibiotics with vancomycin and aztreonam day #4. No fevers overnight, wbc on 5/11 was normal. Scr continues to be elevated but close to baseline, modest uop at 0.63ml/kg/hr. CXR slightly improved.  5/9 urine - ng  Goal of Therapy:  INR 2-3 Vancomycin trough 15-20 Monitor platelets by anticoagulation protocol: Yes   Plan:  Continue to hold warfarin  Daily INR  Continue aztreonam at current dose Will check vancomycin trough tonight to ensure appropriate dosing  Sheppard Coil PharmD., BCPS Clinical Pharmacist Pager [phone] 06/26/2012 9:01 AM

## 2012-06-27 ENCOUNTER — Inpatient Hospital Stay (HOSPITAL_COMMUNITY): Payer: Medicare Other

## 2012-06-27 LAB — BASIC METABOLIC PANEL
CO2: 30 mEq/L (ref 19–32)
Calcium: 9.1 mg/dL (ref 8.4–10.5)
Glucose, Bld: 172 mg/dL — ABNORMAL HIGH (ref 70–99)
Potassium: 3.9 mEq/L (ref 3.5–5.1)
Sodium: 139 mEq/L (ref 135–145)

## 2012-06-27 LAB — GLUCOSE, CAPILLARY
Glucose-Capillary: 168 mg/dL — ABNORMAL HIGH (ref 70–99)
Glucose-Capillary: 191 mg/dL — ABNORMAL HIGH (ref 70–99)
Glucose-Capillary: 188 mg/dL — ABNORMAL HIGH (ref 70–99)
Glucose-Capillary: 184 mg/dL — ABNORMAL HIGH (ref 70–99)

## 2012-06-27 MED ORDER — WARFARIN - PHARMACIST DOSING INPATIENT
Freq: Every day | Status: DC
  Administered 2012-06-28: 18:00:00

## 2012-06-27 MED ORDER — WARFARIN SODIUM 7.5 MG PO TABS
7.5000 mg | ORAL_TABLET | Freq: Once | ORAL | Status: AC
  Administered 2012-06-27: 7.5 mg via ORAL
  Filled 2012-06-27: qty 1

## 2012-06-27 NOTE — Progress Notes (Signed)
Physical Therapy Treatment Patient Details Name: Wayne Montoya MRN: [id number] DOB: 1922/10/17 Today's Date: 06/27/2012 Time: 4742-5956 PT Time Calculation (min): 27 min  PT Assessment / Plan / Recommendation Comments on Treatment Session  Pt cont's to require encouragement to participate in therapy but he was able to increase ambulation distance today.  Fatigues quickly.   Strongly encouraged pt to increase OOB activity (meals OOB & ambulating 2-3x's/day with Nsing).  Spoke to Nurse tech Re: increasing activity.  At this date, pt would strongly benefit from ST-SNF to maximize functional recovery & mobility.      Follow Up Recommendations  SNF (pt from ALF)     Does the patient have the potential to tolerate intense rehabilitation     Barriers to Discharge        Equipment Recommendations  None recommended by PT    Recommendations for Other Services    Frequency Min 3X/week   Plan      Precautions / Restrictions Restrictions Weight Bearing Restrictions: No   Pertinent Vitals/Pain Pt on 2L 02 via [place] entire session.      Mobility  Bed Mobility Bed Mobility: Supine to Sit;Sitting - Scoot to Edge of Bed Supine to Sit: 4: Min assist;HOB elevated;With rails Sitting - Scoot to Edge of Bed: 5: Supervision Details for Bed Mobility Assistance: Cues for technique.  (A) to lift shoulders/trunk to sitting upright.  Encouragement to perform as independently as possible.   Transfers Transfers: Sit to Stand;Stand to Sit Sit to Stand: 4: Min assist;With upper extremity assist;From bed;From toilet Stand to Sit: 4: Min assist;With upper extremity assist;With armrests;To chair/3-in-1;To toilet Details for Transfer Assistance: Cues for hand placement &  body positioning before sitting.  (A) for balance & controlled descent.  Ambulation/Gait Ambulation/Gait Assistance: 4: Min assist Ambulation Distance (Feet): 30 Feet Assistive device: Rolling walker Ambulation/Gait Assistance Details: (A) due  to mild unsteadiness.  Pt with slow gait speed, small steps, & fatigues quickly.  Cues for safe use of RW, tall posture.   Gait Pattern: Step-through pattern;Decreased stride length;Decreased step length - left;Decreased step length - right;Trunk flexed (decreased floor clearance) Gait velocity: decreased General Gait Details: Encouragement to increase distance.  Fatigues quickly.   Stairs: No Wheelchair Mobility Wheelchair Mobility: No      PT Goals Acute Rehab PT Goals Time For Goal Achievement: 07/01/12 Potential to Achieve Goals: Good Pt will go Supine/Side to Sit: with supervision PT Goal: Supine/Side to Sit - Progress: Not met Pt will Transfer Bed to Chair/Chair to Bed: with supervision Pt will Ambulate: 51 - 150 feet;with supervision PT Goal: Ambulate - Progress: Progressing toward goal  Visit Information  Last PT Received On: 06/27/12 Assistance Needed: +1    Subjective Data  Subjective: "I can't walk"   Cognition  Cognition Arousal/Alertness: Awake/alert Behavior During Therapy: WFL for tasks assessed/performed Overall Cognitive Status: History of cognitive impairments - at baseline    Balance  Balance Balance Assessed: Yes Static Standing Balance Static Standing - Balance Support: Bilateral upper extremity supported;During functional activity Static Standing - Level of Assistance: 5: Stand by assistance  End of Session PT - End of Session Equipment Utilized During Treatment: Gait belt Activity Tolerance: Patient limited by fatigue Patient left: in chair;with call bell/phone within reach Nurse Communication: Mobility status     Verdell Face, [place] [phone] 06/27/2012  Agree with above assessment.  Lewis Shock, PT, DPT Pager #: [phone] Office #: [phone]

## 2012-06-27 NOTE — Progress Notes (Signed)
Advanced Heart Failure Rounding Note   Subjective:    Wayne Montoya is a 79 year old white male patient with atrial fibrillation, HIT, HTN, previous CVA, PUD, CRI (1.5-2.0) and CHF secondary to diastolic dysfunction. He has CAD and is s/p CABG in 1999. 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  Admitted with fevers, sob and volume overload. Found to have PNA and mild HF.   CXR with large L effusion and possible infiltrate. Echo showed new systolic HF with EF 30-35%, mod LVH with moderate concentric hypertrophy. LA mod dilated. RA mildly dilated.    Thoracentesis on hold per PCCM as pleural effusion is stable and u/s showed minimal effusion.   Continues on IV lasix. Weight stable. I/O - 500cc Breathing Ok. On 2L. No fevers. Confusion resolved.    Objective:    Vital Signs:   Temp:  [97.5 F (36.4 C)-99.4 F (37.4 C)] 97.5 F (36.4 C) (05/14 1319) Pulse Rate:  [67-95] 67 (05/14 1319) Resp:  [18-20] 20 (05/14 1319) BP: (123-154)/(57-94) 123/57 mmHg (05/14 1319) SpO2:  [92 %-98 %] 95 % (05/14 1319) Weight:  [93.4 kg (205 lb 14.6 oz)] 93.4 kg (205 lb 14.6 oz) (05/14 0622) Last BM Date: 06/27/12  Weight change: Filed Weights   06/25/12 0441 06/26/12 0415 06/27/12 0622  Weight: 96.2 kg (212 lb 1.3 oz) 92.9 kg (204 lb 12.9 oz) 93.4 kg (205 lb 14.6 oz)    Intake/Output:   Intake/Output Summary (Last 24 hours) at 06/27/12 1610 Last data filed at 06/27/12 1300  Gross per 24 hour  Intake    420 ml  Output    950 ml  Net   -530 ml     Physical Exam: General: Elderly. Sitting in chair.  No resp difficulty.  HEENT: normal  Neck: supple. JVP 7-8 . Carotids 2+ bilat; no bruits. No lymphadenopathy or thryomegaly appreciated.  Cor: PMI nonpalpable. Distant HS Irregular rate & rhythm. No rubs, gallops or murmurs.  Lungs: clear. Decreased L base.  Abdomen: soft, nontender, nondistended. No hepatosplenomegaly. No bruits or masses. Good bowel sounds.   Extremities: no cyanosis, clubbing, rash, edema  Neuro: alert. Conversant moves all 4 extremities w/o difficulty.  Telemetry: = AF 100  Labs: Basic Metabolic Panel:  Recent Labs Lab 06/23/12 0530 06/24/12 0434 06/25/12 1059 06/26/12 0330 06/27/12 0545  NA 139 138 136 137 139  K 3.2* 3.5 3.7 3.5 3.9  CL 101 98 98 99 101  CO2 29 27 25 27 30   GLUCOSE 57* 77 94 119* 172*  BUN 27* 32* 34* 36* 35*  CREATININE 2.04* 2.08* 1.86* 2.10* 1.91*  CALCIUM 8.6 9.0 9.3 9.0 9.1    Liver Function Tests:  Recent Labs Lab 06/25/12 1059  AST 17  ALT 10  ALKPHOS 66  BILITOT 0.6  PROT 7.0  ALBUMIN 3.0*   No results found for this basename: LIPASE, AMYLASE,  in the last 168 hours No results found for this basename: AMMONIA,  in the last 168 hours  CBC:  Recent Labs Lab 06/22/12 1907 06/23/12 0530 06/24/12 0434  WBC 12.4* 11.0* 8.6  NEUTROABS 10.3*  --   --   HGB 13.3 12.6* 12.6*  HCT 40.6 39.0 39.4  MCV 81.9 81.9 83.7  PLT 140* 136* 145*    Cardiac Enzymes: No results found for this basename: CKTOTAL, CKMB, CKMBINDEX, TROPONINI,  in the last 168 hours  BNP: BNP (last 3 results)  Recent Labs  06/22/12 1907  PROBNP 4692.0*  Imaging: Ct Head Wo Contrast  06/25/2012   *RADIOLOGY REPORT*  Clinical Data: Code stroke, a fascia.  CT HEAD WITHOUT CONTRAST  Technique:  Contiguous axial images were obtained from the base of the skull through the vertex without contrast.  Comparison: 06/23/2012  Findings: There is atrophy and chronic small vessel disease changes.  Old lacunar infarct in the left basal ganglia. No acute intracranial abnormality.  Specifically, no hemorrhage, hydrocephalus, mass lesion, acute infarction, or significant intracranial injury.  No acute calvarial abnormality.  Extensive paranasal sinus disease throughout the paranasal sinuses. Mastoids are clear.  IMPRESSION: No acute intracranial abnormality.  Atrophy, chronic microvascular disease.  Severe diffuse  paranasal sinus disease.  Critical Value/emergent results were called by telephone at the time of interpretation on 06/25/2012 at 6:09 p.m. to Dr. Thad Ranger, who verbally acknowledged these results.   Original Report Authenticated By: Charlett Nose, M.D.   Mr Brain Wo Contrast  06/26/2012   *RADIOLOGY REPORT*  Clinical Data: Diabetic hypertensive patient with confusion and slurred speech.  MRI HEAD WITHOUT CONTRAST  Technique:  Multiplanar, multiecho pulse sequences of the brain and surrounding structures were obtained according to standard protocol without intravenous contrast.  Comparison: 06/25/2012 CT.  No comparison MR.  Findings: No acute infarct.  No intracranial hemorrhage.  Remote bilateral basal ganglia infarcts.  Prominent small vessel disease type changes.  Global atrophy without hydrocephalus.  No intracranial mass lesion detected on this unenhanced exam.  Major intracranial vascular structures are patent.  Ectatic intracranial vasculature with atherosclerotic type changes.  Marked paranasal sinus opacification/mucosal thickening.  Partial opacification mastoid air cells and middle ear cavities bilaterally.  Dens fracture has not healed (noted on 05/04/2009 CT).  IMPRESSION: No acute infarct.  No intracranial hemorrhage.  Remote bilateral basal ganglia infarcts.  Prominent small vessel disease type changes.  Global atrophy without hydrocephalus.  Marked paranasal sinus opacification/mucosal thickening.  Partial opacification mastoid air cells and middle ear cavities bilaterally.  Dens fracture has not healed (noted on 05/04/2009 CT).   Original Report Authenticated By: Lacy Duverney, M.D.   Dg Chest Port 1 View  06/26/2012   *RADIOLOGY REPORT*  Clinical Data: Shortness of breath and pleural effusion.  PORTABLE CHEST - 1 VIEW  Comparison: 06/25/2012  Findings: There is relatively stable left lower lobe consolidation with probable associated left pleural effusion.  Venous hypertensive changes noted  without overt airspace edema.  Heart size is stable.  IMPRESSION: Stable left lower lobe consolidation with probable component of left pleural fluid.   Original Report Authenticated By: Irish Lack, M.D.     Medications:     Scheduled Medications: . aspirin EC  325 mg Oral Daily  . atorvastatin  80 mg Oral Daily  . aztreonam  1 g Intravenous Q8H  . furosemide  40 mg Intravenous BID  . hydrALAZINE  25 mg Oral Q8H  . insulin aspart  0-5 Units Subcutaneous QHS  . insulin aspart  0-9 Units Subcutaneous TID WC  . isosorbide mononitrate  30 mg Oral Daily  . levothyroxine  50 mcg Oral QAC breakfast  . metoprolol tartrate  12.5 mg Oral BID  . multivitamin with minerals  1 tablet Oral Daily  . pantoprazole  40 mg Oral Daily  . potassium chloride SA  20 mEq Oral Daily  . QUEtiapine  25 mg Oral QHS  . senna-docusate  1 tablet Oral BID  . vancomycin  1,000 mg Intravenous Q24H  . warfarin  7.5 mg Oral ONCE-1800  . Warfarin - Pharmacist Dosing  Inpatient   Does not apply q1800    Infusions: . sodium chloride 10 mL/hr at 06/22/12 2354    PRN Medications: acetaminophen, acetaminophen, alum & mag hydroxide-simeth, hydrALAZINE, HYDROmorphone (DILAUDID) injection, metolazone, ondansetron (ZOFRAN) IV, ondansetron, oxyCODONE, zolpidem   Assessment:   1. Healthcare associated (SNF) acquired PNA  2. L pleural effusion - ?parapneumonic  3. Acute systolic HF - EF 40-98%  4. Chronic diastolic HF  5. Chronic AF on coumadin  6. Chronic renal failure  7. CAD s/p CABG  8. DM2  9. Dementia, early  81. DNR/DNI  11. H/o HIT 12. Acute delirium 13. Hypokalemia  Plan/Discussion:    Volume status improving.  Cr stable therefore will continue IV diuresis one more day.  No ACE-I due to renal failure.  Per pulmonary, hold off on thoracentesis at this time.  As CXR still with diffuse opacity with check non-contrast CT chest. Unable to afford NOAC. Will resume coumadin.   Length of Stay: 5  Kamaljit Hizer,MD 4:13 PM  Advanced Heart Failure Team Pager [phone] (M-F; 7a - 4p)  Please contact [facility] for night-coverage after hours (4p -7a ) and weekends on [email or web address]

## 2012-06-27 NOTE — Progress Notes (Signed)
[facility] PROGRESS NOTE  Wayne Montoya WUJ:[id number] DOB: October 01, 1922 DOA: 06/22/2012 PCP: Junious Dresser, MD  Assessment/Plan: Principal Problem:   HCAP (healthcare-associated pneumonia) Active Problems:   DIABETES MELLITUS, TYPE II   HYPERTENSION   Chronic diastolic heart failure   Hypoxemia   Right sided weakness   Acute systolic heart failure   Pleural effusion, left   Pleural effusion   TIA (transient ischemic attack)   Acute delirium    Brief narrative;  79 y/o male with CHF ( follows with heart failure clinic), CAD s/p CABG, paroxysmal a fib on coumadin, CVA, DM, HTN, CKD, was admitted for worsening SOB , fever and some confusion with findings of pneumonia and CHF.   Assessment/Plan:  HCAP  On empiric coverage with vanco and aztreonam since admission  No blood cx sent on admission  Tylenol prn for fever . Remains afebrile and leukocytosis has resolved. Repeat chest x-ray however still shows left-sided consolidation with large effusion. Pulmonary consulted for diagnostic and therapeutic thoracentesis.  re-assess with bedside u/s 5/14, and then decide if thoracentesis warranted   chronic diastolic CHF .  Elevated pro BNP and pulmonary vascular congestion on CXR  diuresing with IV lasix 40 mg bid. Increased dose to 80 mg twice a day. On 80 mg po daily at home. Continue IV Lasix today and then switched to by mouth per  cardiology Hold ARB given renal insufficiency. No recent baseline renal fn known. Added metoprolol and hydralazine  Monitor strict I/O and daily weight  Appreciate cardiology evaluation.  2D echo results show severely reduced EF of 30-35%. Comments on possible vegetation on aortic valve. Discussed with cardiology. Low EF appears to be new. Recommend to follow with increased diuresis.   CKD  no recent baseline. As per son renal function has been worsening. Continue lasix for now and monitor. Hold ARB .   DM  continue SSI. Patient on quite high  dose of lantus ( 60 units bid). reduced to 40 bid but FSG still low yesterdau  A1C 6.6. Will hold lantus for now and continue only SSI. s   AMS  Present on admission ad presumed due to pneumonia.  Had a code stroke called on 5/12. No focal neurological deficit. seemS to have word finding difficulty with delirium. Head CT and MRI brain negative. Appreciate neuro recommendations. It appears patient has some underlying dementia which has been acute worsened with change in environment and underlying infection. Check TSH, B12 and RPR.  Added bedtime seroquel for agitation.    afib  rate controlled. On coumadin . Holding dose given possible thoracentesis. SCD ordered . INR therapeutic.   Hypertension  continue lasix, zaroxolyn, , increased dose of amlod. added low dose BB.   hypothyroidism  Continue synthroid    Code Status: DNR  Family Communication:discussed with son and updated  Disposition Plan: PT Eval  Consultants:  Dr Clarise Cruz  Pulmonary Procedures:  None. Possible left thoracentesis if left pleural effusion not improved with diuresis Antibiotics:  IV vanco and aztreonam ( 5/9>>)   HPI/Subjective:  Patient seen and examined this morning. Patient says not feeling well but unable to specify what sx he is having    Objective: Filed Vitals:   06/26/12 0512 06/26/12 1432 06/26/12 2150 06/27/12 0622  BP: 135/62 134/72 144/94 154/80  Pulse: 89 93 95 77  Temp: 98.6 F (37 C) 98.2 F (36.8 C) 98.8 F (37.1 C) 99.4 F (37.4 C)  TempSrc: Oral Oral Oral Oral  Resp: 16 18 18  18  Height:      Weight:    93.4 kg (205 lb 14.6 oz)  SpO2: 95% 94% 98% 92%    Intake/Output Summary (Last 24 hours) at 06/27/12 0824 Last data filed at 06/27/12 0500  Gross per 24 hour  Intake    240 ml  Output   1175 ml  Net   -935 ml    Exam:  General: Elderly. Lying in bed No resp difficulty.  HEENT: normal  Neck: supple. JVP to jaw . Carotids 2+ bilat; no bruits. No lymphadenopathy or  thryomegaly appreciated.  Cor: PMI nonpalpable. Distant HS Irregular rate & rhythm. No rubs, gallops or murmurs.  Lungs: clear. Decreased L base.  Abdomen: soft, nontender, nondistended. No hepatosplenomegaly. No bruits or masses. Good bowel sounds.  Extremities: no cyanosis, clubbing, rash, edema  Neuro: alert. Conversant moves all 4 extremities w/o difficulty.       Data Reviewed: Basic Metabolic Panel:  Recent Labs Lab 06/23/12 0530 06/24/12 0434 06/25/12 1059 06/26/12 0330 06/27/12 0545  NA 139 138 136 137 139  K 3.2* 3.5 3.7 3.5 3.9  CL 101 98 98 99 101  CO2 29 27 25 27 30   GLUCOSE 57* 77 94 119* 172*  BUN 27* 32* 34* 36* 35*  CREATININE 2.04* 2.08* 1.86* 2.10* 1.91*  CALCIUM 8.6 9.0 9.3 9.0 9.1    Liver Function Tests:  Recent Labs Lab 06/25/12 1059  AST 17  ALT 10  ALKPHOS 66  BILITOT 0.6  PROT 7.0  ALBUMIN 3.0*   No results found for this basename: LIPASE, AMYLASE,  in the last 168 hours No results found for this basename: AMMONIA,  in the last 168 hours  CBC:  Recent Labs Lab 06/22/12 1907 06/23/12 0530 06/24/12 0434  WBC 12.4* 11.0* 8.6  NEUTROABS 10.3*  --   --   HGB 13.3 12.6* 12.6*  HCT 40.6 39.0 39.4  MCV 81.9 81.9 83.7  PLT 140* 136* 145*    Cardiac Enzymes: No results found for this basename: CKTOTAL, CKMB, CKMBINDEX, TROPONINI,  in the last 168 hours BNP (last 3 results)  Recent Labs  06/22/12 1907  PROBNP 4692.0*     CBG:  Recent Labs Lab 06/26/12 0539 06/26/12 1628 06/26/12 2145 06/27/12 0614 06/27/12 0805  GLUCAP 119* 214* 148* 168* 191*    Recent Results (from the past 240 hour(s))  URINE CULTURE     Status: None   Collection Time    06/22/12  9:39 PM      Result Value Range Status   Specimen Description URINE, CLEAN CATCH   Final   Special Requests NONE   Final   Culture  Setup Time 06/22/2012 23:25   Final   Colony Count NO GROWTH   Final   Culture NO GROWTH   Final   Report Status 06/24/2012 FINAL    Final  MRSA PCR SCREENING     Status: None   Collection Time    06/22/12 11:04 PM      Result Value Range Status   MRSA by PCR NEGATIVE  NEGATIVE Final   Comment:            The GeneXpert MRSA Assay (FDA     approved for NASAL specimens     only), is one component of a     comprehensive MRSA colonization     surveillance program. It is not     intended to diagnose MRSA     infection nor to guide or  monitor treatment for     MRSA infections.     Studies: Dg Chest 2 View  06/25/2012   *RADIOLOGY REPORT*  Clinical Data: Shortness of breath.  Left pleural effusion.  CHEST - 2 VIEW  Comparison: 06/22/2012  Findings: AP and lateral views of the chest show cardiomegaly with vascular congestion.  Lung volumes remain low.  Asymmetric elevation left hemidiaphragm noted with left base collapse / consolidation.  There is associated left pleural effusion. Telemetry leads overlie the chest. Bones are diffusely demineralized.  Degenerative changes are noted in the left shoulder.  IMPRESSION: Stable.  Low volume film with cardiomegaly and vascular congestion.  Left base collapse / consolidation with effusion.   Original Report Authenticated By: Kennith Center, M.D.   Dg Chest 2 View  06/22/2012   *RADIOLOGY REPORT*  Clinical Data: Weakness, cough, shortness of breath, history hypertension, diabetes, CHF, coronary artery disease, stroke  CHEST - 2 VIEW  Comparison: 08/03/2011  Findings: Enlargement of cardiac silhouette post CABG. Atherosclerotic ossification aorta. Low lung volumes with mild right basilar atelectasis. New atelectasis versus consolidation left lower lobe. Question small left pleural effusion. Mild chronic central peribronchial thickening. No pneumothorax. Osseous demineralization with advanced left glenohumeral degenerative changes.  IMPRESSION: Enlargement of cardiac silhouette with pulmonary vascular congestion post CABG. New left lower lobe atelectasis versus consolidation and mild right  basilar atelectasis.   Original Report Authenticated By: Ulyses Southward, M.D.   Ct Head Wo Contrast  06/25/2012   *RADIOLOGY REPORT*  Clinical Data: Code stroke, a fascia.  CT HEAD WITHOUT CONTRAST  Technique:  Contiguous axial images were obtained from the base of the skull through the vertex without contrast.  Comparison: 06/23/2012  Findings: There is atrophy and chronic small vessel disease changes.  Old lacunar infarct in the left basal ganglia. No acute intracranial abnormality.  Specifically, no hemorrhage, hydrocephalus, mass lesion, acute infarction, or significant intracranial injury.  No acute calvarial abnormality.  Extensive paranasal sinus disease throughout the paranasal sinuses. Mastoids are clear.  IMPRESSION: No acute intracranial abnormality.  Atrophy, chronic microvascular disease.  Severe diffuse paranasal sinus disease.  Critical Value/emergent results were called by telephone at the time of interpretation on 06/25/2012 at 6:09 p.m. to Dr. Thad Ranger, who verbally acknowledged these results.   Original Report Authenticated By: Charlett Nose, M.D.   Ct Head Wo Contrast  06/23/2012   *RADIOLOGY REPORT*  Clinical Data: Listing to right, new right tremor, confusion  CT HEAD WITHOUT CONTRAST  Technique:  Contiguous axial images were obtained from the base of the skull through the vertex without contrast.  Comparison: 12/15/2008  Findings: Generalized atrophy. Normal ventricular morphology. No midline shift or mass effect. Tiny old lacunar infarct left basal ganglia. Small vessel chronic ischemic changes of deep cerebral white matter. Streak artifacts on initial images, which repeat imaging was performed. No intracranial hemorrhage, mass lesion or evidence of acute infarction. No extra-axial fluid collections. Scattered sinus opacification of fluid. Atherosclerotic calcifications at skull base. Bones diffusely demineralized.  IMPRESSION: Atrophy with small vessel chronic ischemic changes deep cerebral  white matter. Tiny old lacunar infarct left basal ganglia. No acute intracranial abnormalities. Diffuse sinus disease changes.   Original Report Authenticated By: Ulyses Southward, M.D.   Mr Brain Wo Contrast  06/26/2012   *RADIOLOGY REPORT*  Clinical Data: Diabetic hypertensive patient with confusion and slurred speech.  MRI HEAD WITHOUT CONTRAST  Technique:  Multiplanar, multiecho pulse sequences of the brain and surrounding structures were obtained according to standard protocol without intravenous contrast.  Comparison: 06/25/2012 CT.  No comparison MR.  Findings: No acute infarct.  No intracranial hemorrhage.  Remote bilateral basal ganglia infarcts.  Prominent small vessel disease type changes.  Global atrophy without hydrocephalus.  No intracranial mass lesion detected on this unenhanced exam.  Major intracranial vascular structures are patent.  Ectatic intracranial vasculature with atherosclerotic type changes.  Marked paranasal sinus opacification/mucosal thickening.  Partial opacification mastoid air cells and middle ear cavities bilaterally.  Dens fracture has not healed (noted on 05/04/2009 CT).  IMPRESSION: No acute infarct.  No intracranial hemorrhage.  Remote bilateral basal ganglia infarcts.  Prominent small vessel disease type changes.  Global atrophy without hydrocephalus.  Marked paranasal sinus opacification/mucosal thickening.  Partial opacification mastoid air cells and middle ear cavities bilaterally.  Dens fracture has not healed (noted on 05/04/2009 CT).   Original Report Authenticated By: Lacy Duverney, M.D.   Dg Chest Port 1 View  06/26/2012   *RADIOLOGY REPORT*  Clinical Data: Shortness of breath and pleural effusion.  PORTABLE CHEST - 1 VIEW  Comparison: 06/25/2012  Findings: There is relatively stable left lower lobe consolidation with probable associated left pleural effusion.  Venous hypertensive changes noted without overt airspace edema.  Heart size is stable.  IMPRESSION: Stable  left lower lobe consolidation with probable component of left pleural fluid.   Original Report Authenticated By: Irish Lack, M.D.    Scheduled Meds: . aspirin EC  325 mg Oral Daily  . atorvastatin  80 mg Oral Daily  . aztreonam  1 g Intravenous Q8H  . furosemide  40 mg Intravenous BID  . hydrALAZINE  25 mg Oral Q8H  . insulin aspart  0-5 Units Subcutaneous QHS  . insulin aspart  0-9 Units Subcutaneous TID WC  . isosorbide mononitrate  30 mg Oral Daily  . levothyroxine  50 mcg Oral QAC breakfast  . metoprolol tartrate  12.5 mg Oral BID  . multivitamin with minerals  1 tablet Oral Daily  . pantoprazole  40 mg Oral Daily  . potassium chloride SA  20 mEq Oral Daily  . QUEtiapine  25 mg Oral QHS  . senna-docusate  1 tablet Oral BID  . vancomycin  1,000 mg Intravenous Q24H   Continuous Infusions: . sodium chloride 10 mL/hr at 06/22/12 2354    Principal Problem:   HCAP (healthcare-associated pneumonia) Active Problems:   DIABETES MELLITUS, TYPE II   HYPERTENSION   Chronic diastolic heart failure   Hypoxemia   Right sided weakness   Acute systolic heart failure   Pleural effusion, left   Pleural effusion   TIA (transient ischemic attack)   Acute delirium    Time spent: 40 minutes   [facility]  [facility] Pager [phone]. If 8PM-8AM, please contact night-coverage at [email or web address], password [facility] 06/27/2012, 8:24 AM  LOS: 5 days

## 2012-06-27 NOTE — Progress Notes (Signed)
PULMONARY  / CRITICAL CARE MEDICINE  Name: Wayne Montoya MRN: [id number] DOB: 11-10-1922    ADMISSION DATE:  06/22/2012 CONSULTATION DATE:  5/12  REFERRING MD :  Bensimhon PRIMARY SERVICE:  [facility]  CHIEF COMPLAINT:  Pleural effusion  BRIEF PATIENT DESCRIPTION:  79 yo male from SNF with fever, dyspnea, and confusion from HCAP, acute on chronic diastolic CHF.  Found to have Lt pleural effusion on CXR, and PCCM consulted to further assess.   SIGNIFICANT EVENTS / STUDIES:  5/11 - 2D echo>> EF 30-35%. Mod LVH, mod/severely reduced systolic function, diffuse hypokinesis, mod dilated LA  5/12 - Code stroke called in PM, sx resolving, Neurology following 5/13 - Resolved agitation, afebrile, reduced WBC 5/14 - bedside u/s >> minimal Lt pleural effusion  CULTURES: Urine 5/9>>> neg   ANTIBIOTICS: Aztreonam 5/9>>> Vanc 5/9>>>  SUBJECTIVE:    Sleepy.  Sitting in chair.  Still has cough, but not as much sputum.  VITAL SIGNS: Temp:  [98.2 F (36.8 C)-99.4 F (37.4 C)] 99.4 F (37.4 C) (05/14 0622) Pulse Rate:  [77-95] 77 (05/14 0622) Resp:  [18] 18 (05/14 0622) BP: (134-154)/(72-94) 154/80 mmHg (05/14 0622) SpO2:  [92 %-98 %] 92 % (05/14 0622) Weight:  [205 lb 14.6 oz (93.4 kg)] 205 lb 14.6 oz (93.4 kg) (05/14 0622) 2 liters Teller  PHYSICAL EXAMINATION: General: No distress Neuro: Sleepy, wakes up easily, follows commands HEENT:  No sinus tenderness Cardiovascular: irregular Lungs: decreased breath sounds at bases Lt > Rt Abdomen: soft, non tender Ext: no edema   Recent Labs Lab 06/25/12 1059 06/26/12 0330 06/27/12 0545  NA 136 137 139  K 3.7 3.5 3.9  CL 98 99 101  CO2 25 27 30   BUN 34* 36* 35*  CREATININE 1.86* 2.10* 1.91*  GLUCOSE 94 119* 172*    Recent Labs Lab 06/22/12 1907 06/23/12 0530 06/24/12 0434  HGB 13.3 12.6* 12.6*  HCT 40.6 39.0 39.4  WBC 12.4* 11.0* 8.6  PLT 140* 136* 145*   Ct Head Wo Contrast  06/25/2012   *RADIOLOGY REPORT*  Clinical Data:  Code stroke, a fascia.  CT HEAD WITHOUT CONTRAST  Technique:  Contiguous axial images were obtained from the base of the skull through the vertex without contrast.  Comparison: 06/23/2012  Findings: There is atrophy and chronic small vessel disease changes.  Old lacunar infarct in the left basal ganglia. No acute intracranial abnormality.  Specifically, no hemorrhage, hydrocephalus, mass lesion, acute infarction, or significant intracranial injury.  No acute calvarial abnormality.  Extensive paranasal sinus disease throughout the paranasal sinuses. Mastoids are clear.  IMPRESSION: No acute intracranial abnormality.  Atrophy, chronic microvascular disease.  Severe diffuse paranasal sinus disease.  Critical Value/emergent results were called by telephone at the time of interpretation on 06/25/2012 at 6:09 p.m. to Dr. Thad Ranger, who verbally acknowledged these results.   Original Report Authenticated By: Charlett Nose, M.D.   Mr Brain Wo Contrast  06/26/2012   *RADIOLOGY REPORT*  Clinical Data: Diabetic hypertensive patient with confusion and slurred speech.  MRI HEAD WITHOUT CONTRAST  Technique:  Multiplanar, multiecho pulse sequences of the brain and surrounding structures were obtained according to standard protocol without intravenous contrast.  Comparison: 06/25/2012 CT.  No comparison MR.  Findings: No acute infarct.  No intracranial hemorrhage.  Remote bilateral basal ganglia infarcts.  Prominent small vessel disease type changes.  Global atrophy without hydrocephalus.  No intracranial mass lesion detected on this unenhanced exam.  Major intracranial vascular structures are patent.  Ectatic intracranial vasculature  with atherosclerotic type changes.  Marked paranasal sinus opacification/mucosal thickening.  Partial opacification mastoid air cells and middle ear cavities bilaterally.  Dens fracture has not healed (noted on 05/04/2009 CT).  IMPRESSION: No acute infarct.  No intracranial hemorrhage.  Remote  bilateral basal ganglia infarcts.  Prominent small vessel disease type changes.  Global atrophy without hydrocephalus.  Marked paranasal sinus opacification/mucosal thickening.  Partial opacification mastoid air cells and middle ear cavities bilaterally.  Dens fracture has not healed (noted on 05/04/2009 CT).   Original Report Authenticated By: Lacy Duverney, M.D.   Dg Chest Port 1 View  06/26/2012   *RADIOLOGY REPORT*  Clinical Data: Shortness of breath and pleural effusion.  PORTABLE CHEST - 1 VIEW  Comparison: 06/25/2012  Findings: There is relatively stable left lower lobe consolidation with probable associated left pleural effusion.  Venous hypertensive changes noted without overt airspace edema.  Heart size is stable.  IMPRESSION: Stable left lower lobe consolidation with probable component of left pleural fluid.   Original Report Authenticated By: Irish Lack, M.D.    ASSESSMENT / PLAN:  HCAP with Lt pleural effusion. Clinically improving, and has stable findings on CXR.  Family reluctant to have interventions at this time.  Small effusion on bedside u/s 5/14. Plan: -continue Abx per primary team -defer thoracentesis for now -f/u CXR intermittently to monitor for resolution of infiltrate and effusion  Acute hypoxic respiratory failure 2nd to HCAP, pleural effusion, and acute on chronic systolic/diastolic CHF. Pt is DNR/DNI. Plan: -oxygen to keep SpO2 > 92%  Acute on chronic systolic/diastolic CHF. Hx of A fib on coumadin. Plan: -per primary team and cardiology -negative fluid balance as tolerated -?if he is a good candidate for continued coumadin use  Hx of CVA, dementia with acute change in mental status 5/13. Plan: -primary team  Summary: Small left pleural effusion on bedside u/s 5/14.  Most of radiographic findings from pneumonia.  No indication for thoracentesis at this time.  PCCM will sign off.  Please call if additional help needed.  Coralyn Helling, MD [facility]  Pulmonary/Critical Care 06/27/2012, 10:24 AM Pager:  [phone] After 3pm call: [phone]

## 2012-06-27 NOTE — Progress Notes (Signed)
Physical Therapy Treatment Patient Details Name: Wayne Montoya MRN: [id number] DOB: [birth year] Today's Date: 06/27/2012 Time: 4098-1191 PT Time Calculation (min): 27 min  PT Assessment / Plan / Recommendation Comments on Treatment Session  Pt cont's to require encouragement to participate in therapy but he was able to increase ambulation distance today.  Fatigues quickly.   Strongly encouraged pt to increase OOB activity (meals OOB & ambulating 2-3x's/day with Nsing).  Spoke to Nurse tech Re: increasing activity.  At this date, pt would strongly benefit from ST-SNF to maximize functional recovery & mobility.      Follow Up Recommendations  SNF (pt from ALF)     Does the patient have the potential to tolerate intense rehabilitation     Barriers to Discharge        Equipment Recommendations  None recommended by PT    Recommendations for Other Services    Frequency Min 3X/week   Plan      Precautions / Restrictions Restrictions Weight Bearing Restrictions: No   Pertinent Vitals/Pain Pt on 2L 02 via [place] entire session.      Mobility  Bed Mobility Bed Mobility: Supine to Sit;Sitting - Scoot to Edge of Bed Supine to Sit: 4: Min assist;HOB elevated;With rails Sitting - Scoot to Edge of Bed: 5: Supervision Details for Bed Mobility Assistance: Cues for technique.  (A) to lift shoulders/trunk to sitting upright.  Encouragement to perform as independently as possible.   Transfers Transfers: Sit to Stand;Stand to Sit Sit to Stand: 4: Min assist;With upper extremity assist;From bed;From toilet Stand to Sit: 4: Min assist;With upper extremity assist;With armrests;To chair/3-in-1;To toilet Details for Transfer Assistance: Cues for hand placement &  body positioning before sitting.  (A) for balance & controlled descent.  Ambulation/Gait Ambulation/Gait Assistance: 4: Min assist Ambulation Distance (Feet): 30 Feet Assistive device: Rolling walker Ambulation/Gait Assistance Details: (A) due  to mild unsteadiness.  Pt with slow gait speed, small steps, & fatigues quickly.  Cues for safe use of RW, tall posture.   Gait Pattern: Step-through pattern;Decreased stride length;Decreased step length - left;Decreased step length - right;Trunk flexed (decreased floor clearance) Gait velocity: decreased General Gait Details: Encouragement to increase distance.  Fatigues quickly.   Stairs: No Wheelchair Mobility Wheelchair Mobility: No      PT Goals Acute Rehab PT Goals Time For Goal Achievement: 07/01/12 Potential to Achieve Goals: Good Pt will go Supine/Side to Sit: with supervision PT Goal: Supine/Side to Sit - Progress: Not met Pt will Transfer Bed to Chair/Chair to Bed: with supervision Pt will Ambulate: 51 - 150 feet;with supervision PT Goal: Ambulate - Progress: Progressing toward goal  Visit Information  Last PT Received On: 06/27/12 Assistance Needed: +1    Subjective Data  Subjective: "I can't walk"   Cognition  Cognition Arousal/Alertness: Awake/alert Behavior During Therapy: WFL for tasks assessed/performed Overall Cognitive Status: History of cognitive impairments - at baseline    Balance  Balance Balance Assessed: Yes Static Standing Balance Static Standing - Balance Support: Bilateral upper extremity supported;During functional activity Static Standing - Level of Assistance: 5: Stand by assistance  End of Session PT - End of Session Equipment Utilized During Treatment: Gait belt Activity Tolerance: Patient limited by fatigue Patient left: in chair;with call bell/phone within reach Nurse Communication: Mobility status     Verdell Face, [place] [phone] 06/27/2012

## 2012-06-27 NOTE — Progress Notes (Signed)
Pharmacy Consult Note  Pharmacy Consult for Coumadin Indication: atrial fibrillation   Allergies  Allergen Reactions  . Heparin Other (See Comments)    HIT  . Moxifloxacin Other (See Comments)    Per MAR  . Penicillins Other (See Comments)    Per [facility]    Patient Measurements: Height: 5\' 10"  (177.8 cm) Weight: 205 lb 14.6 oz (93.4 kg) (bed) IBW/kg (Calculated) : 73  Vital Signs: Temp: 97.5 F (36.4 C) (05/14 1319) Temp src: Axillary (05/14 1319) BP: 123/57 mmHg (05/14 1319) Pulse Rate: 67 (05/14 1319)  Labs:  Recent Labs  06/25/12 0450 06/25/12 1059 06/26/12 0330 06/27/12 0545  APTT  --   --  57*  --   LABPROT 20.1*  --  22.2* 21.4*  INR 1.78*  --  2.04* 1.94*  CREATININE  --  1.86* 2.10* 1.91*    Estimated Creatinine Clearance: 31.3 ml/min (by C-G formula based on Cr of 1.91).   Medical History: Past Medical History  Diagnosis Date  . CHF (congestive heart failure)     secondary to diastolic function. Echo 4/09: EF 55%  . CAD (coronary artery disease)     status post CABG in 1996  . Paroxysmal atrial fibrillation   . Diabetes mellitus     type was not specified  . HTN (hypertension)   . Hyperlipidemia   . Chronic renal insufficiency     with a base line creatinine of 1.6  . Cerebrovascular accident     Hx of it, Left sided facial droop  . Depression   . Carotid bruit     s/p carotids in 2006, showed mild hard plaque in the bulbs and proximal bifurcations, left greater that right. Both ICAs take steep posterior dives. Right ICA 0-39% stenosis; left ICA 40-59% stenosis.  Carrotids 9/20: 0-39%  . BPH (benign prostatic hyperplasia)     Hx of it.  . S/P cholecystectomy   . Fall     C2 fracture/ 12/15/08  . Dementia     Assessment: 45 YOM with hx of afib - CVR on metoprolol, on coumadin PTA, pharmacy was consulted to resume coumadin. Patient's warfarin has been on hold for possible thoracentesis which has now been cancelled.  No cbc today, INR has  drifted down below 2 today. No bleeding has been noted during stay.  PTA dose 5mg /7.5mg  alternating every other day.   Goal of Therapy:  INR 2-3 Monitor platelets by anticoagulation protocol: Yes   Plan:  Warfarin 7.5mg  tonight Daily INR as already ordered  Sheppard Coil PharmD., BCPS Clinical Pharmacist Pager [phone] 06/27/2012 4:02 PM

## 2012-06-28 ENCOUNTER — Inpatient Hospital Stay (HOSPITAL_COMMUNITY): Payer: Medicare Other

## 2012-06-28 LAB — BODY FLUID CELL COUNT WITH DIFFERENTIAL
Eos, Fluid: 0 %
Monocyte-Macrophage-Serous Fluid: 5 % — ABNORMAL LOW (ref 50–90)
Neutrophil Count, Fluid: 14 % (ref 0–25)
Total Nucleated Cell Count, Fluid: 475 cu mm (ref 0–1000)

## 2012-06-28 LAB — GLUCOSE, CAPILLARY
Glucose-Capillary: 172 mg/dL — ABNORMAL HIGH (ref 70–99)
Glucose-Capillary: 207 mg/dL — ABNORMAL HIGH (ref 70–99)
Glucose-Capillary: 163 mg/dL — ABNORMAL HIGH (ref 70–99)
Glucose-Capillary: 255 mg/dL — ABNORMAL HIGH (ref 70–99)

## 2012-06-28 LAB — BASIC METABOLIC PANEL
Chloride: 101 mEq/L (ref 96–112)
GFR calc Af Amer: 32 mL/min — ABNORMAL LOW (ref >90)
GFR calc non Af Amer: 28 mL/min — ABNORMAL LOW (ref >90)
Potassium: 3.9 mEq/L (ref 3.5–5.1)
Sodium: 140 mEq/L (ref 135–145)

## 2012-06-28 LAB — LACTATE DEHYDROGENASE, PLEURAL OR PERITONEAL FLUID: LD, Fluid: 121 U/L — ABNORMAL HIGH (ref 3–23)

## 2012-06-28 LAB — PROTIME-INR
INR: 2.04 — ABNORMAL HIGH (ref 0.00–1.49)
Prothrombin Time: 22.2 seconds — ABNORMAL HIGH (ref 11.6–15.2)

## 2012-06-28 LAB — PROTEIN, TOTAL: Total Protein: 7.1 g/dL (ref 6.0–8.3)

## 2012-06-28 LAB — LACTATE DEHYDROGENASE: LDH: 280 U/L — ABNORMAL HIGH (ref 94–250)

## 2012-06-28 MED ORDER — INSULIN GLARGINE 100 UNIT/ML ~~LOC~~ SOLN: 30.0000 [IU] | Freq: Two times a day (BID) | SUBCUTANEOUS | Status: DC

## 2012-06-28 MED ORDER — FUROSEMIDE 80 MG PO TABS
80.0000 mg | ORAL_TABLET | Freq: Every morning | ORAL | Status: DC
  Administered 2012-06-29: 80 mg via ORAL
  Filled 2012-06-28: qty 1

## 2012-06-28 MED ORDER — CYANOCOBALAMIN 1000 MCG/ML IJ SOLN
1000.0000 ug | Freq: Once | INTRAMUSCULAR | Status: AC
  Administered 2012-06-28: 1000 ug via INTRAMUSCULAR
  Filled 2012-06-28 (×3): qty 1

## 2012-06-28 MED ORDER — METOPROLOL SUCCINATE ER 25 MG PO TB24: 25.0000 mg | ORAL_TABLET | Freq: Every day | ORAL | Status: DC

## 2012-06-28 MED ORDER — CYANOCOBALAMIN 1000 MCG/ML IJ SOLN: 1000.0000 ug | Freq: Once | INTRAMUSCULAR | Status: AC

## 2012-06-28 MED ORDER — ASPIRIN 325 MG PO TBEC: 325.0000 mg | DELAYED_RELEASE_TABLET | Freq: Every day | ORAL | Status: DC

## 2012-06-28 MED ORDER — HYDRALAZINE HCL 25 MG PO TABS: 25.0000 mg | ORAL_TABLET | Freq: Three times a day (TID) | ORAL | Status: DC

## 2012-06-28 MED ORDER — ISOSORBIDE MONONITRATE ER 30 MG PO TB24: 30.0000 mg | ORAL_TABLET | Freq: Every day | ORAL | Status: AC

## 2012-06-28 MED ORDER — WARFARIN SODIUM 5 MG PO TABS
5.0000 mg | ORAL_TABLET | Freq: Once | ORAL | Status: AC
  Administered 2012-06-28: 5 mg via ORAL
  Filled 2012-06-28: qty 1

## 2012-06-28 NOTE — Progress Notes (Signed)
Advanced Heart Failure Rounding Note   Subjective:    Wayne Montoya is a 79 year old white male patient with atrial fibrillation, HIT, HTN, previous CVA, PUD, CRI (1.5-2.0) and CHF secondary to diastolic dysfunction. He has CAD and is s/p CABG in 1999. 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  Admitted with fevers, sob and volume overload. Found to have PNA and mild HF.   CXR with large L effusion and possible infiltrate. Echo showed new systolic HF with EF 30-35%, mod LVH with moderate concentric hypertrophy. LA mod dilated. RA mildly dilated.    Thoracentesis on hold per PCCM as pleural effusion is stable and u/s showed minimal effusion.  CT chest showed moderate Lt pleural effusion.    Continues on IV lasix. Weight down 1 pound last night and total of 10 since admission.  I/O +138 cc Breathing ok on 2L but worse with movement. No fevers.    Objective:    Vital Signs:   Temp:  [97.5 F (36.4 C)-98.7 F (37.1 C)] 98.7 F (37.1 C) (05/15 1220) Pulse Rate:  [67-88] 88 (05/15 1220) Resp:  [14-20] 14 (05/15 1220) BP: (123-146)/(57-73) 146/62 mmHg (05/15 1220) SpO2:  [95 %-98 %] 96 % (05/15 1220) Weight:  [92.67 kg (204 lb 4.8 oz)] 92.67 kg (204 lb 4.8 oz) (05/15 0633) Last BM Date: 06/27/12  Weight change: Filed Weights   06/26/12 0415 06/27/12 0622 06/28/12 1610  Weight: 92.9 kg (204 lb 12.9 oz) 93.4 kg (205 lb 14.6 oz) 92.67 kg (204 lb 4.8 oz)    Intake/Output:   Intake/Output Summary (Last 24 hours) at 06/28/12 1252 Last data filed at 06/28/12 0636  Gross per 24 hour  Intake    868 ml  Output    600 ml  Net    268 ml     Physical Exam: General: Elderly. Sitting in chair.  No resp difficulty. More alert HEENT: normal  Neck: supple. JVP 7-8 . Carotids 2+ bilat; no bruits. No lymphadenopathy or thryomegaly appreciated.  Cor: PMI nonpalpable. Distant HS Irregular rate & rhythm. No rubs, gallops or murmurs.  Lungs: clear. Decreased L  base.  Abdomen: soft, nontender, nondistended. No hepatosplenomegaly. No bruits or masses. Good bowel sounds.  Extremities: no cyanosis, clubbing, rash, edema  Neuro: alert. Conversant moves all 4 extremities w/o difficulty.  Telemetry: = AF 100  Labs: Basic Metabolic Panel:  Recent Labs Lab 06/24/12 0434 06/25/12 1059 06/26/12 0330 06/27/12 0545 06/28/12 0500  NA 138 136 137 139 140  K 3.5 3.7 3.5 3.9 3.9  CL 98 98 99 101 101  CO2 27 25 27 30 29   GLUCOSE 77 94 119* 172* 169*  BUN 32* 34* 36* 35* 37*  CREATININE 2.08* 1.86* 2.10* 1.91* 2.02*  CALCIUM 9.0 9.3 9.0 9.1 9.3    Liver Function Tests:  Recent Labs Lab 06/25/12 1059  AST 17  ALT 10  ALKPHOS 66  BILITOT 0.6  PROT 7.0  ALBUMIN 3.0*   No results found for this basename: LIPASE, AMYLASE,  in the last 168 hours No results found for this basename: AMMONIA,  in the last 168 hours  CBC:  Recent Labs Lab 06/22/12 1907 06/23/12 0530 06/24/12 0434  WBC 12.4* 11.0* 8.6  NEUTROABS 10.3*  --   --   HGB 13.3 12.6* 12.6*  HCT 40.6 39.0 39.4  MCV 81.9 81.9 83.7  PLT 140* 136* 145*    Cardiac Enzymes: No results found for this basename: CKTOTAL, CKMB,  CKMBINDEX, TROPONINI,  in the last 168 hours  BNP: BNP (last 3 results)  Recent Labs  06/22/12 1907  PROBNP 4692.0*     Imaging: Ct Chest Wo Contrast  06/27/2012   *RADIOLOGY REPORT*  Clinical Data: Shortness of breath.  Left effusion.  CT CHEST WITHOUT CONTRAST  Technique:  Multidetector CT imaging of the chest was performed following the standard protocol without IV contrast.  Comparison: Plain films 06/26/2012  Findings: There is a moderate-sized left pleural effusion. Airspace disease in the lingula and left lower lobe likely reflects compressive atelectasis.  No pleural effusion on the right. Minimal bibasilar atelectasis.  There is cardiomegaly.  Prior CABG.  Vascular congestion present. No overt edema.  There are scattered small and borderline sized  mediastinal lymph nodes.  No definite hilar adenopathy.  No axillary adenopathy.  Visualized thyroid and chest wall soft tissues unremarkable. Imaging into the upper abdomen shows no acute findings.  IMPRESSION: Moderate left pleural effusion.  Compressive atelectasis in the lingula and left lower lobe.  Cardiomegaly, vascular congestion.  Prior CABG.  Borderline sized scattered mediastinal lymph nodes.   Original Report Authenticated By: Charlett Nose, M.D.     Medications:     Scheduled Medications: . atorvastatin  80 mg Oral Daily  . aztreonam  1 g Intravenous Q8H  . cyanocobalamin  1,000 mcg Intramuscular Once  . furosemide  40 mg Intravenous BID  . hydrALAZINE  25 mg Oral Q8H  . insulin aspart  0-5 Units Subcutaneous QHS  . insulin aspart  0-9 Units Subcutaneous TID WC  . isosorbide mononitrate  30 mg Oral Daily  . levothyroxine  50 mcg Oral QAC breakfast  . metoprolol tartrate  12.5 mg Oral BID  . multivitamin with minerals  1 tablet Oral Daily  . pantoprazole  40 mg Oral Daily  . potassium chloride SA  20 mEq Oral Daily  . QUEtiapine  25 mg Oral QHS  . senna-docusate  1 tablet Oral BID  . vancomycin  1,000 mg Intravenous Q24H  . Warfarin - Pharmacist Dosing Inpatient   Does not apply q1800    Infusions: . sodium chloride 10 mL/hr at 06/22/12 2354    PRN Medications: acetaminophen, acetaminophen, alum & mag hydroxide-simeth, hydrALAZINE, HYDROmorphone (DILAUDID) injection, metolazone, ondansetron (ZOFRAN) IV, ondansetron, oxyCODONE, zolpidem   Assessment:   1. Healthcare associated (SNF) acquired PNA  2. L pleural effusion - ?parapneumonic  3. Acute systolic HF - EF 40-98%  4. Chronic diastolic HF  5. Chronic AF on coumadin  6. Chronic renal failure  7. CAD s/p CABG  8. DM2  9. Dementia, early  52. DNR/DNI  11. H/o HIT 12. Acute delirium 13. Hypokalemia  Plan/Discussion:    Volume status improving, weight down 10 pounds.  Cr mildly elevated, will change IV  lasix back to po.  No ACE-I due to renal failure.    With moderate Lt side pleural effusion there is plan for diagnostic/therapeutic thoracentesis by Dr. Craige Cotta today.  I discussed this with Wayne Croon, RN - the patient's son who is in agreement.    Wayne Auth,MD 12:52 PM  Length of Stay: 6 Advanced Heart Failure Team Pager [phone] (M-F; 7a - 4p)  Please contact [facility] for night-coverage after hours (4p -7a ) and weekends on [email or web address]

## 2012-06-28 NOTE — Progress Notes (Signed)
PULMONARY  / CRITICAL CARE MEDICINE  Name: Wayne Montoya MRN: [id number] DOB: 1922-11-05    ADMISSION DATE:  06/22/2012 CONSULTATION DATE:  5/12  REFERRING MD :  Bensimhon PRIMARY SERVICE:  [facility]  CHIEF COMPLAINT:  Pleural effusion  BRIEF PATIENT DESCRIPTION:  79 yo male from SNF with fever, dyspnea, and confusion from HCAP, acute on chronic diastolic CHF.  Found to have Lt pleural effusion on CXR, and PCCM consulted to further assess.   SIGNIFICANT EVENTS / STUDIES:  5/11 - 2D echo>> EF 30-35%. Mod LVH, mod/severely reduced systolic function, diffuse hypokinesis, mod dilated LA  5/12 - Code stroke called in PM, sx resolving, Neurology following 5/13 - Resolved agitation, afebrile, reduced WBC 5/14 - bedside u/s >> minimal Lt pleural effusion 5/14 - CT chest >> moderate LT pleural effusion with compressive atelectasis  CULTURES: Urine 5/9>>> neg   ANTIBIOTICS: Aztreonam 5/9>>> Vanc 5/9>>>  SUBJECTIVE:    Denies chest pain.  Feels short of breath when he moves.  VITAL SIGNS: Temp:  [97.5 F (36.4 C)-98.3 F (36.8 C)] 98.3 F (36.8 C) (05/15 4098) Pulse Rate:  [67-87] 85 (05/15 0633) Resp:  [18-20] 18 (05/15 0633) BP: (123-142)/(57-73) 142/73 mmHg (05/15 0633) SpO2:  [95 %-98 %] 98 % (05/15 1191) Weight:  [204 lb 4.8 oz (92.67 kg)] 204 lb 4.8 oz (92.67 kg) (05/15 4782) Room Air  PHYSICAL EXAMINATION: General: No distress Neuro: Sleepy, wakes up easily, follows commands HEENT:  No sinus tenderness Cardiovascular: irregular Lungs: decreased breath sounds at bases Lt > Rt Abdomen: soft, non tender Ext: no edema   Recent Labs Lab 06/26/12 0330 06/27/12 0545 06/28/12 0500  NA 137 139 140  K 3.5 3.9 3.9  CL 99 101 101  CO2 27 30 29   BUN 36* 35* 37*  CREATININE 2.10* 1.91* 2.02*  GLUCOSE 119* 172* 169*    Recent Labs Lab 06/22/12 1907 06/23/12 0530 06/24/12 0434  HGB 13.3 12.6* 12.6*  HCT 40.6 39.0 39.4  WBC 12.4* 11.0* 8.6  PLT 140* 136* 145*   Ct  Chest Wo Contrast  06/27/2012   *RADIOLOGY REPORT*  Clinical Data: Shortness of breath.  Left effusion.  CT CHEST WITHOUT CONTRAST  Technique:  Multidetector CT imaging of the chest was performed following the standard protocol without IV contrast.  Comparison: Plain films 06/26/2012  Findings: There is a moderate-sized left pleural effusion. Airspace disease in the lingula and left lower lobe likely reflects compressive atelectasis.  No pleural effusion on the right. Minimal bibasilar atelectasis.  There is cardiomegaly.  Prior CABG.  Vascular congestion present. No overt edema.  There are scattered small and borderline sized mediastinal lymph nodes.  No definite hilar adenopathy.  No axillary adenopathy.  Visualized thyroid and chest wall soft tissues unremarkable. Imaging into the upper abdomen shows no acute findings.  IMPRESSION: Moderate left pleural effusion.  Compressive atelectasis in the lingula and left lower lobe.  Cardiomegaly, vascular congestion.  Prior CABG.  Borderline sized scattered mediastinal lymph nodes.   Original Report Authenticated By: Charlett Nose, M.D.    ASSESSMENT / PLAN:  HCAP with Lt pleural effusion. Family agreeable to thoracentesis. Plan: -continue Abx per primary team -proceed with diagnostic/therapeutic thoracentesis  Acute hypoxic respiratory failure 2nd to HCAP, pleural effusion, and acute on chronic systolic/diastolic CHF. Pt is DNR/DNI. Plan: -oxygen to keep SpO2 > 92%  Acute on chronic systolic/diastolic CHF. Hx of A fib on coumadin. Plan: -per primary team and cardiology -negative fluid balance as tolerated -?if he is  a good candidate for continued coumadin use  Hx of CVA, dementia with acute change in mental status 5/13. Plan: -primary team   Coralyn Helling, MD [facility] Pulmonary/Critical Care 06/28/2012, 10:59 AM Pager:  [phone] After 3pm call: [phone]

## 2012-06-28 NOTE — Progress Notes (Signed)
Patient discussed with his son Marcial Pacas today.  Physical Therapy recommends SNF but son remains adamant that patient return to [facility] ALF when stable for d/c. He feels that they take good care of his father and are able to manage him well there.  Son also feels that his father's confusion levels would increase if they moved him to another place.  Son states that he is a Engineer, civil (consulting) on [employer] at [employer].  He has been in contact with Dr. Susie Cassette and Dr. [name] and will continue to monitor for date of stability.  Son plans to take patient back to facility when stable via car.  CSW will monitor.  Lorri Frederick. [place]  [phone]

## 2012-06-28 NOTE — Progress Notes (Signed)
Pharmacy Consult Note  Pharmacy Consult for Coumadin Indication: atrial fibrillation   Allergies  Allergen Reactions  . Heparin Other (See Comments)    HIT  . Moxifloxacin Other (See Comments)    Per MAR  . Penicillins Other (See Comments)    Per [facility]    Patient Measurements: Height: 5\' 10"  (177.8 cm) Weight: 204 lb 4.8 oz (92.67 kg) (scale C) IBW/kg (Calculated) : 73  Vital Signs: Temp: 98.7 F (37.1 C) (05/15 1220) Temp src: Oral (05/15 1220) BP: 134/81 mmHg (05/15 1253) Pulse Rate: 95 (05/15 1253)  Labs:  Recent Labs  06/26/12 0330 06/27/12 0545 06/28/12 0500  APTT 57*  --   --   LABPROT 22.2* 21.4* 22.2*  INR 2.04* 1.94* 2.04*  CREATININE 2.10* 1.91* 2.02*    Estimated Creatinine Clearance: 29.5 ml/min (by C-G formula based on Cr of 2.02).   Medical History: Past Medical History  Diagnosis Date  . CHF (congestive heart failure)     secondary to diastolic function. Echo 4/09: EF 55%  . CAD (coronary artery disease)     status post CABG in 1996  . Paroxysmal atrial fibrillation   . Diabetes mellitus     type was not specified  . HTN (hypertension)   . Hyperlipidemia   . Chronic renal insufficiency     with a base line creatinine of 1.6  . Cerebrovascular accident     Hx of it, Left sided facial droop  . Depression   . Carotid bruit     s/p carotids in 2006, showed mild hard plaque in the bulbs and proximal bifurcations, left greater that right. Both ICAs take steep posterior dives. Right ICA 0-39% stenosis; left ICA 40-59% stenosis.  Carrotids 9/20: 0-39%  . BPH (benign prostatic hyperplasia)     Hx of it.  . S/P cholecystectomy   . Fall     C2 fracture/ 12/15/08  . Dementia     Assessment: 10 YOM with hx of afib - CVR on metoprolol, on coumadin PTA, pharmacy was consulted to resume coumadin. Patient's warfarin was held for thoracentesis but restarted 5/14 with INR 2.04.  Thoracentesis 5/15 with fluid removal.    PTA dose 5mg /7.5mg   alternating every other day.   Goal of Therapy:  INR 2-3 Monitor platelets by anticoagulation protocol: Yes   Plan:  Warfarin 5mg  tonight Daily INR as already ordered  Leota Sauers Pharm.D. CPP, BCPS Clinical Pharmacist [phone] 06/28/2012 1:04 PM

## 2012-06-28 NOTE — Procedures (Signed)
Thoracentesis Procedure Note  Pre-operative Diagnosis: Left Pleural Effusion  Post-operative Diagnosis: same  Indications: Diagnostic & Therapeutic drainage of effusion  Procedure Details  Consent: Informed consent was obtained. Risks of the procedure were discussed including: infection, bleeding, pain, pneumothorax.  Under sterile conditions the patient was positioned. Betadine solution and sterile drapes were utilized.  1% buffered lidocaine was used to anesthetize the 7th rib space. Fluid was obtained without any difficulties and minimal blood loss.  A dressing was applied to the wound and wound care instructions were provided.   Findings 950 ml of cloudy amber pleural fluid was obtained.   Complications:  None; patient tolerated the procedure well.          Condition: stable  Plan A follow up chest x-ray was ordered. Fluid for glucose, protein, LDH, cell count, gram stain and culture, and cytology.   Attending Attestation: I performed the procedure.  Coralyn Helling, MD [facility] Pulmonary/Critical Care 06/28/2012, 12:28 PM Pager:  [phone] After 3pm call: [phone]

## 2012-06-28 NOTE — Progress Notes (Signed)
PT Cancellation Note  Patient Details Name: CHEYNE BOULDEN MRN: [id number] DOB: Jul 07, 1922   Cancelled Treatment:    Reason Eval/Treat Not Completed: Spoke with RN who is requesting to hold 2/2 to family feels pt isn't doing well today.  Will f/u tomorrow.      Verdell Face, [place] [phone] 06/28/2012

## 2012-06-28 NOTE — Progress Notes (Signed)
Wayne Montoya MRN: [id number] DOB/AGE: 77-May-1924 77 y.o.  PCP: Junious Dresser, MD   Admit date: 06/22/2012 Discharge date: 06/28/2012  Discharge Diagnoses:      HCAP (healthcare-associated pneumonia) Active Problems:   DIABETES MELLITUS, TYPE II   HYPERTENSION Acute on chronic systolic diastolic heart failure EF of 30-35%   Hypoxemia   Right sided weakness   Acute systolic heart failure   Pleural effusion, left   Pleural effusion   TIA (transient ischemic attack)   Acute delirium     Medication List    STOP taking these medications       amLODipine-benazepril 5-20 MG per capsule  Commonly known as:  LOTREL      TAKE these medications       acetaminophen 500 MG tablet  Commonly known as:  TYLENOL  Take 1,000 mg by mouth every 4 (four) hours as needed for pain or fever.     alum & mag hydroxide-simeth 200-200-20 MG/5ML suspension  Commonly known as:  MAALOX/MYLANTA  Take 30 mLs by mouth every 6 (six) hours as needed for indigestion.     anti-nausea solution  Take 30 mLs by mouth every 30 (thirty) minutes as needed for nausea (up to 4 times a day).     Artificial Tears 0.1-0.3 % Soln  Place 1 drop into both eyes 3 (three) times daily.             atorvastatin 80 MG tablet  Commonly known as:  LIPITOR  Take 80 mg by mouth daily.     cyanocobalamin 1000 MCG/ML injection  Commonly known as:  (VITAMIN B-12)  Inject 1 mL (1,000 mcg total) into the muscle once.     furosemide 80 MG tablet  Commonly known as:  LASIX  Take 1 tablet (80 mg total) by mouth daily.     hydrALAZINE 25 MG tablet  Commonly known as:  APRESOLINE  Take 1 tablet (25 mg total) by mouth every 8 (eight) hours.     insulin aspart 100 UNIT/ML injection  Commonly known as:  novoLOG  Inject 12 Units into the skin 3 (three) times daily with meals as needed for high blood sugar (hold if CBG is less than 100).     insulin glargine 100 UNIT/ML injection  Commonly known as:  LANTUS   Inject 0.3 mLs (30 Units total) into the skin 2 (two) times daily.     isosorbide mononitrate 30 MG 24 hr tablet  Commonly known as:  IMDUR  Take 1 tablet (30 mg total) by mouth daily.     levothyroxine 50 MCG tablet  Commonly known as:  SYNTHROID, LEVOTHROID  Take 50 mcg by mouth daily before breakfast.     loperamide 2 MG capsule  Commonly known as:  IMODIUM  Take 2 mg by mouth 4 (four) times daily as needed for diarrhea or loose stools.     magnesium hydroxide 400 MG/5ML suspension  Commonly known as:  MILK OF MAGNESIA  Take 30 mLs by mouth 2 (two) times daily as needed for constipation.     metolazone 2.5 MG tablet  Commonly known as:  ZAROXOLYN  Take 2.5 mg by mouth daily as needed (when weight is above 220 pounds.). 30 minutes prior to lasix  when weight is above 220 pounds.     metoprolol succinate 25 MG 24 hr tablet  Commonly known as:  TOPROL XL  Take 1 tablet (25 mg total) by mouth daily.     multivitamin  capsule  Take 1 capsule by mouth daily.     NON FORMULARY  Diabetic test strips and lancets. Any brand. Check 4 times a day     omeprazole 20 MG capsule  Commonly known as:  PRILOSEC  Take 20 mg by mouth daily.     potassium chloride SA 20 MEQ tablet  Commonly known as:  K-DUR,KLOR-CON  Take 20 mEq by mouth daily.     sodium phosphate enema  Commonly known as:  FLEET  Place 1 enema rectally as needed (if constipation not relieved by milk of magnesia). follow package directions     warfarin 5 MG tablet  Commonly known as:  COUMADIN  Take 5 mg by mouth every other day.     warfarin 7.5 MG tablet  Commonly known as:  COUMADIN  Take 7.5 mg by mouth every other day.        Discharge Condition: Stable Disposition:  SNF   Consults:  Cardiology Neurology   Significant Diagnostic Studies: Dg Chest 2 View  06/25/2012   *RADIOLOGY REPORT*  Clinical Data: Shortness of breath.  Left pleural effusion.  CHEST - 2 VIEW  Comparison: 06/22/2012  Findings:  AP and lateral views of the chest show cardiomegaly with vascular congestion.  Lung volumes remain low.  Asymmetric elevation left hemidiaphragm noted with left base collapse / consolidation.  There is associated left pleural effusion. Telemetry leads overlie the chest. Bones are diffusely demineralized.  Degenerative changes are noted in the left shoulder.  IMPRESSION: Stable.  Low volume film with cardiomegaly and vascular congestion.  Left base collapse / consolidation with effusion.   Original Report Authenticated By: Kennith Center, M.D.   Dg Chest 2 View  06/22/2012   *RADIOLOGY REPORT*  Clinical Data: Weakness, cough, shortness of breath, history hypertension, diabetes, CHF, coronary artery disease, stroke  CHEST - 2 VIEW  Comparison: 08/03/2011  Findings: Enlargement of cardiac silhouette post CABG. Atherosclerotic ossification aorta. Low lung volumes with mild right basilar atelectasis. New atelectasis versus consolidation left lower lobe. Question small left pleural effusion. Mild chronic central peribronchial thickening. No pneumothorax. Osseous demineralization with advanced left glenohumeral degenerative changes.  IMPRESSION: Enlargement of cardiac silhouette with pulmonary vascular congestion post CABG. New left lower lobe atelectasis versus consolidation and mild right basilar atelectasis.   Original Report Authenticated By: Ulyses Southward, M.D.   Ct Head Wo Contrast  06/25/2012   *RADIOLOGY REPORT*  Clinical Data: Code stroke, a fascia.  CT HEAD WITHOUT CONTRAST  Technique:  Contiguous axial images were obtained from the base of the skull through the vertex without contrast.  Comparison: 06/23/2012  Findings: There is atrophy and chronic small vessel disease changes.  Old lacunar infarct in the left basal ganglia. No acute intracranial abnormality.  Specifically, no hemorrhage, hydrocephalus, mass lesion, acute infarction, or significant intracranial injury.  No acute calvarial abnormality.  Extensive  paranasal sinus disease throughout the paranasal sinuses. Mastoids are clear.  IMPRESSION: No acute intracranial abnormality.  Atrophy, chronic microvascular disease.  Severe diffuse paranasal sinus disease.  Critical Value/emergent results were called by telephone at the time of interpretation on 06/25/2012 at 6:09 p.m. to Dr. Thad Ranger, who verbally acknowledged these results.   Original Report Authenticated By: Charlett Nose, M.D.   Ct Head Wo Contrast  06/23/2012   *RADIOLOGY REPORT*  Clinical Data: Listing to right, new right tremor, confusion  CT HEAD WITHOUT CONTRAST  Technique:  Contiguous axial images were obtained from the base of the skull through the vertex without contrast.  Comparison: 12/15/2008  Findings: Generalized atrophy. Normal ventricular morphology. No midline shift or mass effect. Tiny old lacunar infarct left basal ganglia. Small vessel chronic ischemic changes of deep cerebral white matter. Streak artifacts on initial images, which repeat imaging was performed. No intracranial hemorrhage, mass lesion or evidence of acute infarction. No extra-axial fluid collections. Scattered sinus opacification of fluid. Atherosclerotic calcifications at skull base. Bones diffusely demineralized.  IMPRESSION: Atrophy with small vessel chronic ischemic changes deep cerebral white matter. Tiny old lacunar infarct left basal ganglia. No acute intracranial abnormalities. Diffuse sinus disease changes.   Original Report Authenticated By: Ulyses Southward, M.D.   Ct Chest Wo Contrast  06/27/2012   *RADIOLOGY REPORT*  Clinical Data: Shortness of breath.  Left effusion.  CT CHEST WITHOUT CONTRAST  Technique:  Multidetector CT imaging of the chest was performed following the standard protocol without IV contrast.  Comparison: Plain films 06/26/2012  Findings: There is a moderate-sized left pleural effusion. Airspace disease in the lingula and left lower lobe likely reflects compressive atelectasis.  No pleural  effusion on the right. Minimal bibasilar atelectasis.  There is cardiomegaly.  Prior CABG.  Vascular congestion present. No overt edema.  There are scattered small and borderline sized mediastinal lymph nodes.  No definite hilar adenopathy.  No axillary adenopathy.  Visualized thyroid and chest wall soft tissues unremarkable. Imaging into the upper abdomen shows no acute findings.  IMPRESSION: Moderate left pleural effusion.  Compressive atelectasis in the lingula and left lower lobe.  Cardiomegaly, vascular congestion.  Prior CABG.  Borderline sized scattered mediastinal lymph nodes.   Original Report Authenticated By: Charlett Nose, M.D.   Mr Brain Wo Contrast  06/26/2012   *RADIOLOGY REPORT*  Clinical Data: Diabetic hypertensive patient with confusion and slurred speech.  MRI HEAD WITHOUT CONTRAST  Technique:  Multiplanar, multiecho pulse sequences of the brain and surrounding structures were obtained according to standard protocol without intravenous contrast.  Comparison: 06/25/2012 CT.  No comparison MR.  Findings: No acute infarct.  No intracranial hemorrhage.  Remote bilateral basal ganglia infarcts.  Prominent small vessel disease type changes.  Global atrophy without hydrocephalus.  No intracranial mass lesion detected on this unenhanced exam.  Major intracranial vascular structures are patent.  Ectatic intracranial vasculature with atherosclerotic type changes.  Marked paranasal sinus opacification/mucosal thickening.  Partial opacification mastoid air cells and middle ear cavities bilaterally.  Dens fracture has not healed (noted on 05/04/2009 CT).  IMPRESSION: No acute infarct.  No intracranial hemorrhage.  Remote bilateral basal ganglia infarcts.  Prominent small vessel disease type changes.  Global atrophy without hydrocephalus.  Marked paranasal sinus opacification/mucosal thickening.  Partial opacification mastoid air cells and middle ear cavities bilaterally.  Dens fracture has not healed (noted  on 05/04/2009 CT).   Original Report Authenticated By: Lacy Duverney, M.D.   Dg Chest Port 1 View  06/26/2012   *RADIOLOGY REPORT*  Clinical Data: Shortness of breath and pleural effusion.  PORTABLE CHEST - 1 VIEW  Comparison: 06/25/2012  Findings: There is relatively stable left lower lobe consolidation with probable associated left pleural effusion.  Venous hypertensive changes noted without overt airspace edema.  Heart size is stable.  IMPRESSION: Stable left lower lobe consolidation with probable component of left pleural fluid.   Original Report Authenticated By: Irish Lack, M.D.      2-D echo LV EF: 30% - 35%  ------------------------------------------------------------ Indications: CHF - 428.0.  ------------------------------------------------------------ History: PMH: Coronary artery disease. Congestive heart failure. Risk factors: Hypoxemia. Carotid artery stenosis. Onychomycosis. Fatigue.  Hypertension. Diabetes mellitus. Dyslipidemia.  ------------------------------------------------------------ Study Conclusions  - Procedure narrative: Transthoracic echocardiography. Image quality was suboptimal. The study was technically difficult, as a result of poor acoustic windows, poor sound wave transmission, restricted patient mobility, and body habitus. - Left ventricle: Wall thickness was increased in a pattern of moderate LVH. There was moderate concentric hypertrophy. Systolic function was moderately to severely reduced. The estimated ejection fraction was in the range of 30% to 35%. Diffuse hypokinesis. - Aortic valve: Cannot exclude vegetation. Mild regurgitation. - Mitral valve: Calcified annulus. Mildly thickened leaflets . - Left atrium: The atrium was moderately dilated. - Right atrium: The atrium was mildly dilated. - Pericardium, extracardiac: There was a left pleural effusion.     Microbiology: Recent Results (from the past 240 hour(s))  URINE CULTURE      Status: None   Collection Time    06/22/12  9:39 PM      Result Value Range Status   Specimen Description URINE, CLEAN CATCH   Final   Special Requests NONE   Final   Culture  Setup Time 06/22/2012 23:25   Final   Colony Count NO GROWTH   Final   Culture NO GROWTH   Final   Report Status 06/24/2012 FINAL   Final  MRSA PCR SCREENING     Status: None   Collection Time    06/22/12 11:04 PM      Result Value Range Status   MRSA by PCR NEGATIVE  NEGATIVE Final   Comment:            The GeneXpert MRSA Assay (FDA     approved for NASAL specimens     only), is one component of a     comprehensive MRSA colonization     surveillance program. It is not     intended to diagnose MRSA     infection nor to guide or     monitor treatment for     MRSA infections.     Labs: Results for orders placed during the hospital encounter of 06/22/12 (from the past 48 hour(s))  URINALYSIS, ROUTINE W REFLEX MICROSCOPIC     Status: Abnormal   Collection Time    06/26/12 11:37 AM      Result Value Range   Color, Urine YELLOW  YELLOW   APPearance CLEAR  CLEAR   Specific Gravity, Urine 1.015  1.005 - 1.030   pH 6.0  5.0 - 8.0   Glucose, UA NEGATIVE  NEGATIVE mg/dL   Hgb urine dipstick NEGATIVE  NEGATIVE   Bilirubin Urine NEGATIVE  NEGATIVE   Ketones, ur NEGATIVE  NEGATIVE mg/dL   Protein, ur 454 (*) NEGATIVE mg/dL   Urobilinogen, UA 1.0  0.0 - 1.0 mg/dL   Nitrite NEGATIVE  NEGATIVE   Leukocytes, UA NEGATIVE  NEGATIVE  URINE MICROSCOPIC-ADD ON     Status: None   Collection Time    06/26/12 11:37 AM      Result Value Range   Squamous Epithelial / LPF RARE  RARE   WBC, UA 0-2  <3 WBC/hpf   RBC / HPF 0-2  <3 RBC/hpf   Bacteria, UA RARE  RARE  TSH     Status: None   Collection Time    06/26/12  1:54 PM      Result Value Range   TSH 2.237  0.350 - 4.500 uIU/mL  VITAMIN B12     Status: None   Collection Time    06/26/12  1:54 PM  Result Value Range   Vitamin B-12 260  211 - 911 pg/mL   RPR     Status: None   Collection Time    06/26/12  1:54 PM      Result Value Range   RPR NON REACTIVE  NON REACTIVE  GLUCOSE, CAPILLARY     Status: Abnormal   Collection Time    06/26/12  4:28 PM      Result Value Range   Glucose-Capillary 214 (*) 70 - 99 mg/dL   Comment 1 Notify RN    VANCOMYCIN, TROUGH     Status: None   Collection Time    06/26/12  9:10 PM      Result Value Range   Vancomycin Tr 16.4  10.0 - 20.0 ug/mL  GLUCOSE, CAPILLARY     Status: Abnormal   Collection Time    06/26/12  9:45 PM      Result Value Range   Glucose-Capillary 148 (*) 70 - 99 mg/dL  PROTIME-INR     Status: Abnormal   Collection Time    06/27/12  5:45 AM      Result Value Range   Prothrombin Time 21.4 (*) 11.6 - 15.2 seconds   INR 1.94 (*) 0.00 - 1.49  BASIC METABOLIC PANEL     Status: Abnormal   Collection Time    06/27/12  5:45 AM      Result Value Range   Sodium 139  135 - 145 mEq/L   Potassium 3.9  3.5 - 5.1 mEq/L   Chloride 101  96 - 112 mEq/L   CO2 30  19 - 32 mEq/L   Glucose, Bld 172 (*) 70 - 99 mg/dL   BUN 35 (*) 6 - 23 mg/dL   Creatinine, Ser 5.62 (*) 0.50 - 1.35 mg/dL   Calcium 9.1  8.4 - 13.0 mg/dL   GFR calc non Af Amer 30 (*) >90 mL/min   GFR calc Af Amer 35 (*) >90 mL/min   Comment:            The eGFR has been calculated     using the CKD EPI equation.     This calculation has not been     validated in all clinical     situations.     eGFR's persistently     <90 mL/min signify     possible Chronic Kidney Disease.  GLUCOSE, CAPILLARY     Status: Abnormal   Collection Time    06/27/12  6:14 AM      Result Value Range   Glucose-Capillary 168 (*) 70 - 99 mg/dL  GLUCOSE, CAPILLARY     Status: Abnormal   Collection Time    06/27/12  8:05 AM      Result Value Range   Glucose-Capillary 191 (*) 70 - 99 mg/dL   Comment 1 Notify RN    GLUCOSE, CAPILLARY     Status: Abnormal   Collection Time    06/27/12 11:05 AM      Result Value Range   Glucose-Capillary 188 (*)  70 - 99 mg/dL   Comment 1 Notify RN    GLUCOSE, CAPILLARY     Status: Abnormal   Collection Time    06/27/12  4:06 PM      Result Value Range   Glucose-Capillary 185 (*) 70 - 99 mg/dL   Comment 1 Notify RN    GLUCOSE, CAPILLARY     Status: Abnormal   Collection Time    06/27/12 10:00 PM  Result Value Range   Glucose-Capillary 184 (*) 70 - 99 mg/dL  PROTIME-INR     Status: Abnormal   Collection Time    06/28/12  5:00 AM      Result Value Range   Prothrombin Time 22.2 (*) 11.6 - 15.2 seconds   INR 2.04 (*) 0.00 - 1.49  BASIC METABOLIC PANEL     Status: Abnormal   Collection Time    06/28/12  5:00 AM      Result Value Range   Sodium 140  135 - 145 mEq/L   Potassium 3.9  3.5 - 5.1 mEq/L   Chloride 101  96 - 112 mEq/L   CO2 29  19 - 32 mEq/L   Glucose, Bld 169 (*) 70 - 99 mg/dL   BUN 37 (*) 6 - 23 mg/dL   Creatinine, Ser 6.21 (*) 0.50 - 1.35 mg/dL   Calcium 9.3  8.4 - 30.8 mg/dL   GFR calc non Af Amer 28 (*) >90 mL/min   GFR calc Af Amer 32 (*) >90 mL/min   Comment:            The eGFR has been calculated     using the CKD EPI equation.     This calculation has not been     validated in all clinical     situations.     eGFR's persistently     <90 mL/min signify     possible Chronic Kidney Disease.  GLUCOSE, CAPILLARY     Status: Abnormal   Collection Time    06/28/12  5:30 AM      Result Value Range   Glucose-Capillary 172 (*) 70 - 99 mg/dL     HPI  Mr. Tessler is a 79 year old white male patient with atrial fibrillation, HIT, HTN, previous CVA, PUD, CRI (1.5-2.0) and CHF secondary to diastolic dysfunction. He has CAD and is s/p CABG in 1999. 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   Admitted with fevers, sob and volume overload. Found to have PNA and mild HF.  CXR with large L effusion and possible infiltrate. Echo showed new systolic HF with EF 30-35%, mod LVH with moderate concentric hypertrophy. LA mod dilated. RA mildly  dilated.  Thoracentesis on hold per PCCM as pleural effusion is stable and u/s showed minimal effusion.      HOSPITAL COURSE:   Shortness of breath HCAP with Lt pleural effusion. acute on chronic systolic/diastolic CHF  Clinically improving, and has stable findings on CXR. Family reluctant to have interventions at this time. Small effusion on bedside u/s 5/14. Has been on vancomycin and asked urinalysis 5/9, Has completed 7 day course of broad-spectrum antibiotics plan for diagnostic/therapeutic thoracentesis by Dr. Craige Cotta today  -f/u CXR intermittently to monitor for resolution of infiltrate and effusion DNR/DNI   Hx of A fib on coumadin. Metoprolol changed to Toprol-XL   Acute on chronic systolic/diastolic CHF .  Elevated pro BNP and pulmonary vascular congestion on CXR  Diuresed with IV lasix 40 mg bid.   On 80 mg po daily at home which will be continued.  2D echo results show severely reduced EF of 30-35%. Comments on possible vegetation on aortic valve. Discussed with cardiology. Low EF appears to be new. Recommend to follow with increased diuresis.   Holding ARB given renal insufficiency. No recent baseline renal fn known. Added metoprolol and hydralazine     CKD  no recent baseline. As per son renal function has  been worsening. Continue lasix for now and monitor. Holding ARB . BMP weekly   DM  Patient on quite high dose of lantus ( 60 units bid). reduced to 30 bid  patient advised to monitor Accu-Cheks closely and hemoglobin A1C 6.6.   AMS  Present on admission ad presumed due to pneumonia.  Had a code stroke called on 5/12. No focal neurological deficit. seemS to have word finding difficulty with delirium. Head CT and MRI brain negative. Seen by neurology. It appears patient has some underlying dementia which has been acute worsened with change in environment and underlying infection.  TSH within normal limits Vitamin B12 260, started on vitamin B12  supplementation   afib  rate controlled. On coumadin . Continue home dose  Hypertension  continue lasix, as needed zaroxolyn, ,   hypothyroidism  Continue synthroid          Discharge Exam:   Blood pressure 142/73, pulse 85, temperature 98.3 F (36.8 C), temperature source Oral, resp. rate 18, height 5\' 10"  (1.778 m), weight 92.67 kg (204 lb 4.8 oz), SpO2 98.00%. General: Elderly. Sitting in chair. No resp difficulty.  HEENT: normal  Neck: supple. JVP 7-8 . Carotids 2+ bilat; no bruits. No lymphadenopathy or thryomegaly appreciated.  Cor: PMI nonpalpable. Distant HS Irregular rate & rhythm. No rubs, gallops or murmurs.  Lungs: clear. Decreased L base.  Abdomen: soft, nontender, nondistended. No hepatosplenomegaly. No bruits or masses. Good bowel sounds.  Extremities: no cyanosis, clubbing, rash, edema  Neuro: alert. Conversant moves all 4 extremities w/o difficulty.         SignedRicharda Overlie 06/28/2012, 7:56 AM

## 2012-06-29 LAB — GLUCOSE, CAPILLARY
Glucose-Capillary: 193 mg/dL — ABNORMAL HIGH (ref 70–99)
Glucose-Capillary: 200 mg/dL — ABNORMAL HIGH (ref 70–99)

## 2012-06-29 LAB — BASIC METABOLIC PANEL
CO2: 27 mEq/L (ref 19–32)
Calcium: 9.3 mg/dL (ref 8.4–10.5)
Chloride: 96 mEq/L (ref 96–112)
Sodium: 134 mEq/L — ABNORMAL LOW (ref 135–145)

## 2012-06-29 LAB — PROTIME-INR: INR: 2.54 — ABNORMAL HIGH (ref 0.00–1.49)

## 2012-06-29 MED ORDER — WARFARIN SODIUM 5 MG PO TABS
5.0000 mg | ORAL_TABLET | Freq: Once | ORAL | Status: DC
  Filled 2012-06-29: qty 1

## 2012-06-29 MED ORDER — OXYMETAZOLINE HCL 0.05 % NA SOLN
1.0000 | Freq: Two times a day (BID) | NASAL | Status: DC
  Administered 2012-06-29: 1 via NASAL
  Filled 2012-06-29: qty 15

## 2012-06-29 NOTE — Progress Notes (Signed)
Pharmacy Consult Note  Pharmacy Consult for Coumadin Indication: atrial fibrillation   Allergies  Allergen Reactions  . Hydralazine Hcl Other (See Comments)    Dizziness  . Heparin Other (See Comments)    HIT  . Moxifloxacin Other (See Comments)    Per MAR  . Penicillins Other (See Comments)    Per [facility]    Patient Measurements: Height: 5\' 10"  (177.8 cm) Weight: 202 lb 6.1 oz (91.8 kg) IBW/kg (Calculated) : 73  Vital Signs: Temp: 98.4 F (36.9 C) (05/16 0534) Temp src: Oral (05/16 0534) BP: 130/65 mmHg (05/16 0534) Pulse Rate: 87 (05/16 0534)  Labs:  Recent Labs  06/27/12 0545 06/28/12 0500 06/29/12 0500  LABPROT 21.4* 22.2* 26.1*  INR 1.94* 2.04* 2.54*  CREATININE 1.91* 2.02* 2.19*    Estimated Creatinine Clearance: 27.1 ml/min (by C-G formula based on Cr of 2.19).   Medical History: Past Medical History  Diagnosis Date  . CHF (congestive heart failure)     secondary to diastolic function. Echo 4/09: EF 55%  . CAD (coronary artery disease)     status post CABG in 1996  . Paroxysmal atrial fibrillation   . Diabetes mellitus     type was not specified  . HTN (hypertension)   . Hyperlipidemia   . Chronic renal insufficiency     with a base line creatinine of 1.6  . Cerebrovascular accident     Hx of it, Left sided facial droop  . Depression   . Carotid bruit     s/p carotids in 2006, showed mild hard plaque in the bulbs and proximal bifurcations, left greater that right. Both ICAs take steep posterior dives. Right ICA 0-39% stenosis; left ICA 40-59% stenosis.  Carrotids 9/20: 0-39%  . BPH (benign prostatic hyperplasia)     Hx of it.  . S/P cholecystectomy   . Fall     C2 fracture/ 12/15/08  . Dementia     Assessment: 58 YOM with hx of afib - CVR on metoprolol, on coumadin PTA, pharmacy was consulted to resume coumadin. Patient's warfarin was held for thoracentesis but restarted 5/14 with INR 2.04.  Thoracentesis 5/15 with fluid removal.   INR  2.54 quick elevation possibly from Coumadin 10mg  dose earlier this week.  No bleeding noted and plan to d/c home today. PTA dose 5mg /7.5mg  alternating every other day.   Goal of Therapy:  INR 2-3 Monitor platelets by anticoagulation protocol: Yes   Plan:  Warfarin 5mg  tonight then start home dose of alternating QOD with 5mg /7.5mg  tomorrow Daily INR as already ordered  [employer] Pharm.D. CPP, BCPS Clinical Pharmacist [phone] 06/29/2012 10:17 AM

## 2012-06-29 NOTE — Progress Notes (Signed)
Inpatient Diabetes Program Recommendations  AACE/ADA: New Consensus Statement on Inpatient Glycemic Control (2013)  Target Ranges:  Prepandial:   less than 140 mg/dL      Peak postprandial:   less than 180 mg/dL (1-2 hours)      Critically ill patients:  140 - 180 mg/dL     Results for IYAD, DEROO (MRN [id number]) as of 06/29/2012 12:38  Ref. Range 06/29/2012 05:42 06/29/2012 11:36  Glucose-Capillary Latest Range: 70-99 mg/dL 409 (H) 811 (H)    Inpatient Diabetes Program Recommendations Insulin - Basal: Please consider adding a small portion of patient's home Lantus- Lantus 10 units bid  Will follow. Ambrose Finland RN, MSN, CDE Diabetes Coordinator Inpatient Diabetes Program [phone]

## 2012-06-29 NOTE — Progress Notes (Signed)
Pt nose bleeding, b/p 129/64, Dr. Susie Cassette made aware, new orders given to give Afrin spray and allow patient to stay for 2 hours after to monitor nose bleeds, will continue to monitor.

## 2012-06-29 NOTE — Progress Notes (Signed)
Advanced Heart Failure Rounding Note   Subjective:    Wayne Montoya is a 79 year old white male patient with atrial fibrillation, HIT, HTN, previous CVA, PUD, CRI (1.5-2.0) and CHF secondary to diastolic dysfunction. He has CAD and is s/p CABG in 1999. 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  Admitted with fevers, sob and volume overload. Found to have PNA and mild HF.   CXR with large L effusion and possible infiltrate. Echo showed new systolic HF with EF 30-35%, mod LVH with moderate concentric hypertrophy. LA mod dilated. RA mildly dilated.     CT chest showed moderate Lt pleural effusion.  S/p left thoracentesis with 900 cc removal.  F/u chest xray shows decreased Lt pleural effusion. S/p thoracentesis yesterday with 900 cc out. Fluid looks mostly transudative.   Lasix changed from IV to po yesterday.  Continues to diurese.  Down total of 12 pounds.  Currently no c/o of dyspnea, orthopnea or PND.      Objective:    Vital Signs:   Temp:  [98 F (36.7 C)-98.4 F (36.9 C)] 98.4 F (36.9 C) (05/16 0534) Pulse Rate:  [61-96] 96 (05/16 1046) Resp:  [18] 18 (05/16 0534) BP: (113-149)/(59-77) 149/77 mmHg (05/16 1046) SpO2:  [93 %-96 %] 95 % (05/16 0534) Weight:  [91.8 kg (202 lb 6.1 oz)] 91.8 kg (202 lb 6.1 oz) (05/16 0534) Last BM Date: 06/28/12  Weight change: Filed Weights   06/27/12 0622 06/28/12 0633 06/29/12 0534  Weight: 93.4 kg (205 lb 14.6 oz) 92.67 kg (204 lb 4.8 oz) 91.8 kg (202 lb 6.1 oz)    Intake/Output:   Intake/Output Summary (Last 24 hours) at 06/29/12 1336 Last data filed at 06/29/12 1212  Gross per 24 hour  Intake   1115 ml  Output    926 ml  Net    189 ml     Physical Exam: General: Elderly. Sitting in chair.  No resp difficulty. More alert HEENT: normal  Neck: supple. JVP 7-8 . Carotids 2+ bilat; no bruits. No lymphadenopathy or thryomegaly appreciated.  Cor: PMI nonpalpable. Distant HS Irregular rate & rhythm.  No rubs, gallops or murmurs.  Lungs: clear. Decreased L base.  Abdomen: soft, nontender, nondistended. No hepatosplenomegaly. No bruits or masses. Good bowel sounds.  Extremities: no cyanosis, clubbing, rash, edema  Neuro: alert. Conversant moves all 4 extremities w/o difficulty.  Telemetry: = AF 70-80s  Labs: Basic Metabolic Panel:  Recent Labs Lab 06/25/12 1059 06/26/12 0330 06/27/12 0545 06/28/12 0500 06/29/12 0500  NA 136 137 139 140 134*  K 3.7 3.5 3.9 3.9 4.4  CL 98 99 101 101 96  CO2 25 27 30 29 27   GLUCOSE 94 119* 172* 169* 187*  BUN 34* 36* 35* 37* 41*  CREATININE 1.86* 2.10* 1.91* 2.02* 2.19*  CALCIUM 9.3 9.0 9.1 9.3 9.3    Liver Function Tests:  Recent Labs Lab 06/25/12 1059 06/28/12 1244  AST 17  --   ALT 10  --   ALKPHOS 66  --   BILITOT 0.6  --   PROT 7.0 7.1  ALBUMIN 3.0*  --    No results found for this basename: LIPASE, AMYLASE,  in the last 168 hours No results found for this basename: AMMONIA,  in the last 168 hours  CBC:  Recent Labs Lab 06/22/12 1907 06/23/12 0530 06/24/12 0434  WBC 12.4* 11.0* 8.6  NEUTROABS 10.3*  --   --   HGB 13.3 12.6* 12.6*  HCT 40.6 39.0 39.4  MCV 81.9 81.9 83.7  PLT 140* 136* 145*    Cardiac Enzymes: No results found for this basename: CKTOTAL, CKMB, CKMBINDEX, TROPONINI,  in the last 168 hours  BNP: BNP (last 3 results)  Recent Labs  06/22/12 1907  PROBNP 4692.0*     Imaging: Ct Chest Wo Contrast  06/27/2012   *RADIOLOGY REPORT*  Clinical Data: Shortness of breath.  Left effusion.  CT CHEST WITHOUT CONTRAST  Technique:  Multidetector CT imaging of the chest was performed following the standard protocol without IV contrast.  Comparison: Plain films 06/26/2012  Findings: There is a moderate-sized left pleural effusion. Airspace disease in the lingula and left lower lobe likely reflects compressive atelectasis.  No pleural effusion on the right. Minimal bibasilar atelectasis.  There is cardiomegaly.   Prior CABG.  Vascular congestion present. No overt edema.  There are scattered small and borderline sized mediastinal lymph nodes.  No definite hilar adenopathy.  No axillary adenopathy.  Visualized thyroid and chest wall soft tissues unremarkable. Imaging into the upper abdomen shows no acute findings.  IMPRESSION: Moderate left pleural effusion.  Compressive atelectasis in the lingula and left lower lobe.  Cardiomegaly, vascular congestion.  Prior CABG.  Borderline sized scattered mediastinal lymph nodes.   Original Report Authenticated By: Charlett Nose, M.D.   Dg Chest Port 1 View  06/28/2012   *RADIOLOGY REPORT*  Clinical Data: Post thoracentesis  PORTABLE CHEST - 1 VIEW  Comparison: CT 06/27/2012  Findings: Decreased left pleural effusion following thoracentesis. Negative for pneumothorax. Improvement in left lower lobe atelectasis.  Mild vascular congestion is unchanged.  The right lung remains clear.  IMPRESSION: Decreased left pleural effusion following thoracentesis.  No pneumothorax.   Original Report Authenticated By: Janeece Riggers, M.D.     Medications:     Scheduled Medications: . atorvastatin  80 mg Oral Daily  . aztreonam  1 g Intravenous Q8H  . furosemide  80 mg Oral q morning - 10a  . hydrALAZINE  25 mg Oral Q8H  . insulin aspart  0-5 Units Subcutaneous QHS  . insulin aspart  0-9 Units Subcutaneous TID WC  . isosorbide mononitrate  30 mg Oral Daily  . levothyroxine  50 mcg Oral QAC breakfast  . metoprolol tartrate  12.5 mg Oral BID  . multivitamin with minerals  1 tablet Oral Daily  . pantoprazole  40 mg Oral Daily  . potassium chloride SA  20 mEq Oral Daily  . QUEtiapine  25 mg Oral QHS  . senna-docusate  1 tablet Oral BID  . vancomycin  1,000 mg Intravenous Q24H  . warfarin  5 mg Oral ONCE-1800  . Warfarin - Pharmacist Dosing Inpatient   Does not apply q1800    Infusions: . sodium chloride 10 mL/hr at 06/28/12 0800    PRN Medications: acetaminophen, acetaminophen,  alum & mag hydroxide-simeth, hydrALAZINE, HYDROmorphone (DILAUDID) injection, metolazone, ondansetron (ZOFRAN) IV, ondansetron, oxyCODONE, zolpidem   Assessment:   1. Healthcare associated (SNF) acquired PNA  2. L pleural effusion - ?parapneumonic  3. Acute systolic HF - EF 16-10%  4. Chronic diastolic HF  5. Chronic AF on coumadin  6. Chronic renal failure  7. CAD s/p CABG  8. DM2  9. Dementia, early  71. DNR/DNI  11. H/o HIT 12. Acute delirium 13. Hypokalemia  Plan/Discussion:    He is s/p thoracentesis. Fluid looks mostly transudative. Patient is now back to baseline on oral lasix 80 mg daily.  Will continue current regimen.  Stop  ACE-I due to renal failure.  With new HF toprol and hydralazine/nitrates have been added.  He has tolerated addition well.  He has had a history of dizziness with hydralazine in the past but BP 120-130s on current regimen.     Discharge Meds Lasix 80 mg daily Hydralazine 12.5 mg TID Imdur 30 mg daily Kdur 20 mEq daily Toprol 12.5 mg BID   We will see him back in two weeks in HF clinic.  Navi Ewton,MD 1:36 PM    Length of Stay: 7 Advanced Heart Failure Team Pager [phone] (M-F; 7a - 4p)  Please contact [facility] for night-coverage after hours (4p -7a ) and weekends on [email or web address]

## 2012-06-29 NOTE — Progress Notes (Signed)
PT Cancellation Note  Patient Details Name: Wayne Montoya MRN: [id number] DOB: March 11, 1922   Cancelled Treatment:  Upon entering room pt with active nose bleed.  Nursing notified.   Unable to work with him today.  He is planning for DC later today.  I agree with HHPT following him at DC.        Judson Roch 06/29/2012, 5:13 PM  Ranae Palms, PT

## 2012-06-29 NOTE — Discharge Summary (Signed)
Wayne Montoya  MRN: [id number]  DOB/AGE: [id number] 77 y.o.  PCP: Junious Dresser, MD  Admit date: 06/22/2012  Discharge date: 06/29/12     Discharge Diagnoses:  HCAP (healthcare-associated pneumonia)  Pleural effusion status post thoracentesis Active Problems:  DIABETES MELLITUS, TYPE II  HYPERTENSION  Acute on chronic systolic diastolic heart failure EF of 30-35%  Hypoxemia  Right sided weakness  Acute systolic heart failure  Pleural effusion, left  Pleural effusion  TIA (transient ischemic attack)  Acute delirium      Medication List     STOP taking these medications       amLODipine-benazepril 5-20 MG per capsule    Commonly known as: LOTREL     TAKE these medications       acetaminophen 500 MG tablet    Commonly known as: TYLENOL    Take 1,000 mg by mouth every 4 (four) hours as needed for pain or fever.    alum & mag hydroxide-simeth 200-200-20 MG/5ML suspension    Commonly known as: MAALOX/MYLANTA    Take 30 mLs by mouth every 6 (six) hours as needed for indigestion.    anti-nausea solution    Take 30 mLs by mouth every 30 (thirty) minutes as needed for nausea (up to 4 times a day).    Artificial Tears 0.1-0.3 % Soln    Place 1 drop into both eyes 3 (three) times daily.          atorvastatin 80 MG tablet    Commonly known as: LIPITOR    Take 80 mg by mouth daily.    cyanocobalamin 1000 MCG/ML injection    Commonly known as: (VITAMIN B-12)    Inject 1 mL (1,000 mcg total) into the muscle once.    furosemide 80 MG tablet    Commonly known as: LASIX    Take 1 tablet (80 mg total) by mouth daily.    hydrALAZINE 25 MG tablet    Commonly known as: APRESOLINE    Take 1 tablet (25 mg total) by mouth every 8 (eight) hours.    insulin aspart 100 UNIT/ML injection    Commonly known as: novoLOG    Inject 12 Units into the skin 3 (three) times daily with meals as needed for high blood sugar (hold if CBG is less than 100).    insulin glargine 100 UNIT/ML injection     Commonly known as: LANTUS    Inject 0.3 mLs (30 Units total) into the skin 2 (two) times daily.    isosorbide mononitrate 30 MG 24 hr tablet    Commonly known as: IMDUR    Take 1 tablet (30 mg total) by mouth daily.    levothyroxine 50 MCG tablet    Commonly known as: SYNTHROID, LEVOTHROID    Take 50 mcg by mouth daily before breakfast.    loperamide 2 MG capsule    Commonly known as: IMODIUM    Take 2 mg by mouth 4 (four) times daily as needed for diarrhea or loose stools.    magnesium hydroxide 400 MG/5ML suspension    Commonly known as: MILK OF MAGNESIA    Take 30 mLs by mouth 2 (two) times daily as needed for constipation.    metolazone 2.5 MG tablet    Commonly known as: ZAROXOLYN    Take 2.5 mg by mouth daily as needed (when weight is above 220 pounds.). 30 minutes prior to lasix when weight is above 220 pounds.    metoprolol succinate 25 MG 24 hr  tablet    Commonly known as: TOPROL XL    Take 1 tablet (25 mg total) by mouth daily.    multivitamin capsule    Take 1 capsule by mouth daily.    NON FORMULARY    Diabetic test strips and lancets. Any brand. Check 4 times a day    omeprazole 20 MG capsule    Commonly known as: PRILOSEC    Take 20 mg by mouth daily.    potassium chloride SA 20 MEQ tablet    Commonly known as: K-DUR,KLOR-CON    Take 20 mEq by mouth daily.    sodium phosphate enema    Commonly known as: FLEET    Place 1 enema rectally as needed (if constipation not relieved by milk of magnesia). follow package directions    warfarin 5 MG tablet    Commonly known as: COUMADIN    Take 5 mg by mouth every other day.    warfarin 7.5 MG tablet    Commonly known as: COUMADIN    Take 7.5 mg by mouth every other day.       Discharge Condition: Stable  Disposition: SNF  Consults:  Cardiology  Neurology  Significant Diagnostic Studies:  Dg Chest 2 View  06/25/2012 *RADIOLOGY REPORT* Clinical Data: Shortness of breath. Left pleural effusion. CHEST - 2 VIEW  Comparison: 06/22/2012 Findings: AP and lateral views of the chest show cardiomegaly with vascular congestion. Lung volumes remain low. Asymmetric elevation left hemidiaphragm noted with left base collapse / consolidation. There is associated left pleural effusion. Telemetry leads overlie the chest. Bones are diffusely demineralized. Degenerative changes are noted in the left shoulder. IMPRESSION: Stable. Low volume film with cardiomegaly and vascular congestion. Left base collapse / consolidation with effusion. Original Report Authenticated By: Kennith Center, M.D.  Dg Chest 2 View  06/22/2012 *RADIOLOGY REPORT* Clinical Data: Weakness, cough, shortness of breath, history hypertension, diabetes, CHF, coronary artery disease, stroke CHEST - 2 VIEW Comparison: 08/03/2011 Findings: Enlargement of cardiac silhouette post CABG. Atherosclerotic ossification aorta. Low lung volumes with mild right basilar atelectasis. New atelectasis versus consolidation left lower lobe. Question small left pleural effusion. Mild chronic central peribronchial thickening. No pneumothorax. Osseous demineralization with advanced left glenohumeral degenerative changes. IMPRESSION: Enlargement of cardiac silhouette with pulmonary vascular congestion post CABG. New left lower lobe atelectasis versus consolidation and mild right basilar atelectasis. Original Report Authenticated By: Ulyses Southward, M.D.  Ct Head Wo Contrast  06/25/2012 *RADIOLOGY REPORT* Clinical Data: Code stroke, a fascia. CT HEAD WITHOUT CONTRAST Technique: Contiguous axial images were obtained from the base of the skull through the vertex without contrast. Comparison: 06/23/2012 Findings: There is atrophy and chronic small vessel disease changes. Old lacunar infarct in the left basal ganglia. No acute intracranial abnormality. Specifically, no hemorrhage, hydrocephalus, mass lesion, acute infarction, or significant intracranial injury. No acute calvarial abnormality. Extensive  paranasal sinus disease throughout the paranasal sinuses. Mastoids are clear. IMPRESSION: No acute intracranial abnormality. Atrophy, chronic microvascular disease. Severe diffuse paranasal sinus disease. Critical Value/emergent results were called by telephone at the time of interpretation on 06/25/2012 at 6:09 p.m. to Dr. Thad Ranger, who verbally acknowledged these results. Original Report Authenticated By: Charlett Nose, M.D.  Ct Head Wo Contrast  06/23/2012 *RADIOLOGY REPORT* Clinical Data: Listing to right, new right tremor, confusion CT HEAD WITHOUT CONTRAST Technique: Contiguous axial images were obtained from the base of the skull through the vertex without contrast. Comparison: 12/15/2008 Findings: Generalized atrophy. Normal ventricular morphology. No midline shift or mass effect. Tiny  old lacunar infarct left basal ganglia. Small vessel chronic ischemic changes of deep cerebral white matter. Streak artifacts on initial images, which repeat imaging was performed. No intracranial hemorrhage, mass lesion or evidence of acute infarction. No extra-axial fluid collections. Scattered sinus opacification of fluid. Atherosclerotic calcifications at skull base. Bones diffusely demineralized. IMPRESSION: Atrophy with small vessel chronic ischemic changes deep cerebral white matter. Tiny old lacunar infarct left basal ganglia. No acute intracranial abnormalities. Diffuse sinus disease changes. Original Report Authenticated By: Ulyses Southward, M.D.  Ct Chest Wo Contrast  06/27/2012 *RADIOLOGY REPORT* Clinical Data: Shortness of breath. Left effusion. CT CHEST WITHOUT CONTRAST Technique: Multidetector CT imaging of the chest was performed following the standard protocol without IV contrast. Comparison: Plain films 06/26/2012 Findings: There is a moderate-sized left pleural effusion. Airspace disease in the lingula and left lower lobe likely reflects compressive atelectasis. No pleural effusion on the right. Minimal bibasilar  atelectasis. There is cardiomegaly. Prior CABG. Vascular congestion present. No overt edema. There are scattered small and borderline sized mediastinal lymph nodes. No definite hilar adenopathy. No axillary adenopathy. Visualized thyroid and chest wall soft tissues unremarkable. Imaging into the upper abdomen shows no acute findings. IMPRESSION: Moderate left pleural effusion. Compressive atelectasis in the lingula and left lower lobe. Cardiomegaly, vascular congestion. Prior CABG. Borderline sized scattered mediastinal lymph nodes. Original Report Authenticated By: Charlett Nose, M.D.  Mr Brain Wo Contrast  06/26/2012 *RADIOLOGY REPORT* Clinical Data: Diabetic hypertensive patient with confusion and slurred speech. MRI HEAD WITHOUT CONTRAST Technique: Multiplanar, multiecho pulse sequences of the brain and surrounding structures were obtained according to standard protocol without intravenous contrast. Comparison: 06/25/2012 CT. No comparison MR. Findings: No acute infarct. No intracranial hemorrhage. Remote bilateral basal ganglia infarcts. Prominent small vessel disease type changes. Global atrophy without hydrocephalus. No intracranial mass lesion detected on this unenhanced exam. Major intracranial vascular structures are patent. Ectatic intracranial vasculature with atherosclerotic type changes. Marked paranasal sinus opacification/mucosal thickening. Partial opacification mastoid air cells and middle ear cavities bilaterally. Dens fracture has not healed (noted on 05/04/2009 CT). IMPRESSION: No acute infarct. No intracranial hemorrhage. Remote bilateral basal ganglia infarcts. Prominent small vessel disease type changes. Global atrophy without hydrocephalus. Marked paranasal sinus opacification/mucosal thickening. Partial opacification mastoid air cells and middle ear cavities bilaterally. Dens fracture has not healed (noted on 05/04/2009 CT). Original Report Authenticated By: Lacy Duverney, M.D.  Dg Chest  Port 1 View  06/26/2012 *RADIOLOGY REPORT* Clinical Data: Shortness of breath and pleural effusion. PORTABLE CHEST - 1 VIEW Comparison: 06/25/2012 Findings: There is relatively stable left lower lobe consolidation with probable associated left pleural effusion. Venous hypertensive changes noted without overt airspace edema. Heart size is stable. IMPRESSION: Stable left lower lobe consolidation with probable component of left pleural fluid. Original Report Authenticated By: Irish Lack, M.D.    2-D echo  LV EF: 30% - 35%  ------------------------------------------------------------ Indications: CHF - 428.0.  ------------------------------------------------------------ History: PMH: Coronary artery disease. Congestive heart failure. Risk factors: Hypoxemia. Carotid artery stenosis. Onychomycosis. Fatigue. Hypertension. Diabetes mellitus. Dyslipidemia.  ------------------------------------------------------------ Study Conclusions  - Procedure narrative: Transthoracic echocardiography. Image quality was suboptimal. The study was technically difficult, as a result of poor acoustic windows, poor sound wave transmission, restricted patient mobility, and body habitus. - Left ventricle: Wall thickness was increased in a pattern of moderate LVH. There was moderate concentric hypertrophy. Systolic function was moderately to severely reduced. The estimated ejection fraction was in the range of 30% to 35%. Diffuse hypokinesis. - Aortic valve: Cannot exclude vegetation. Mild regurgitation. -  Mitral valve: Calcified annulus. Mildly thickened leaflets . - Left atrium: The atrium was moderately dilated. - Right atrium: The atrium was mildly dilated. - Pericardium, extracardiac: There was a left pleural effusion.  Microbiology:  Recent Results (from the past 240 hour(s))   URINE CULTURE Status: None    Collection Time    06/22/12 9:39 PM   Result  Value  Range  Status    Specimen  Description  URINE, CLEAN CATCH   Final    Special Requests  NONE   Final    Culture Setup Time  06/22/2012 23:25   Final    Colony Count  NO GROWTH   Final    Culture  NO GROWTH   Final    Report Status  06/24/2012 FINAL   Final   MRSA PCR SCREENING Status: None    Collection Time    06/22/12 11:04 PM   Result  Value  Range  Status    MRSA by PCR  NEGATIVE  NEGATIVE  Final    Comment:      The GeneXpert MRSA Assay (FDA     approved for NASAL specimens     only), is one component of a     comprehensive MRSA colonization     surveillance program. It is not     intended to diagnose MRSA     infection nor to guide or     monitor treatment for     MRSA infections.    Labs:  Results for orders placed during the hospital encounter of 06/22/12 (from the past 48 hour(s))   URINALYSIS, ROUTINE W REFLEX MICROSCOPIC Status: Abnormal    Collection Time    06/26/12 11:37 AM   Result  Value  Range    Color, Urine  YELLOW  YELLOW    APPearance  CLEAR  CLEAR    Specific Gravity, Urine  1.015  1.005 - 1.030    pH  6.0  5.0 - 8.0    Glucose, UA  NEGATIVE  NEGATIVE mg/dL    Hgb urine dipstick  NEGATIVE  NEGATIVE    Bilirubin Urine  NEGATIVE  NEGATIVE    Ketones, ur  NEGATIVE  NEGATIVE mg/dL    Protein, ur  960 (*)  NEGATIVE mg/dL    Urobilinogen, UA  1.0  0.0 - 1.0 mg/dL    Nitrite  NEGATIVE  NEGATIVE    Leukocytes, UA  NEGATIVE  NEGATIVE   URINE MICROSCOPIC-ADD ON Status: None    Collection Time    06/26/12 11:37 AM   Result  Value  Range    Squamous Epithelial / LPF  RARE  RARE    WBC, UA  0-2  <3 WBC/hpf    RBC / HPF  0-2  <3 RBC/hpf    Bacteria, UA  RARE  RARE   TSH Status: None    Collection Time    06/26/12 1:54 PM   Result  Value  Range    TSH  2.237  0.350 - 4.500 uIU/mL   VITAMIN B12 Status: None    Collection Time    06/26/12 1:54 PM   Result  Value  Range    Vitamin B-12  260  211 - 911 pg/mL   RPR Status: None    Collection Time    06/26/12 1:54 PM   Result   Value  Range    RPR  NON REACTIVE  NON REACTIVE   GLUCOSE, CAPILLARY Status: Abnormal    Collection Time  06/26/12 4:28 PM   Result  Value  Range    Glucose-Capillary  214 (*)  70 - 99 mg/dL    Comment 1  Notify RN    VANCOMYCIN, TROUGH Status: None    Collection Time    06/26/12 9:10 PM   Result  Value  Range    Vancomycin Tr  16.4  10.0 - 20.0 ug/mL   GLUCOSE, CAPILLARY Status: Abnormal    Collection Time    06/26/12 9:45 PM   Result  Value  Range    Glucose-Capillary  148 (*)  70 - 99 mg/dL   PROTIME-INR Status: Abnormal    Collection Time    06/27/12 5:45 AM   Result  Value  Range    Prothrombin Time  21.4 (*)  11.6 - 15.2 seconds    INR  1.94 (*)  0.00 - 1.49   BASIC METABOLIC PANEL Status: Abnormal    Collection Time    06/27/12 5:45 AM   Result  Value  Range    Sodium  139  135 - 145 mEq/L    Potassium  3.9  3.5 - 5.1 mEq/L    Chloride  101  96 - 112 mEq/L    CO2  30  19 - 32 mEq/L    Glucose, Bld  172 (*)  70 - 99 mg/dL    BUN  35 (*)  6 - 23 mg/dL    Creatinine, Ser  6.21 (*)  0.50 - 1.35 mg/dL    Calcium  9.1  8.4 - 10.5 mg/dL    GFR calc non Af Amer  30 (*)  >90 mL/min    GFR calc Af Amer  35 (*)  >90 mL/min    Comment:      The eGFR has been calculated     using the CKD EPI equation.     This calculation has not been     validated in all clinical     situations.     eGFR's persistently     <90 mL/min signify     possible Chronic Kidney Disease.   GLUCOSE, CAPILLARY Status: Abnormal    Collection Time    06/27/12 6:14 AM   Result  Value  Range    Glucose-Capillary  168 (*)  70 - 99 mg/dL   GLUCOSE, CAPILLARY Status: Abnormal    Collection Time    06/27/12 8:05 AM   Result  Value  Range    Glucose-Capillary  191 (*)  70 - 99 mg/dL    Comment 1  Notify RN    GLUCOSE, CAPILLARY Status: Abnormal    Collection Time    06/27/12 11:05 AM   Result  Value  Range    Glucose-Capillary  188 (*)  70 - 99 mg/dL    Comment 1  Notify RN    GLUCOSE,  CAPILLARY Status: Abnormal    Collection Time    06/27/12 4:06 PM   Result  Value  Range    Glucose-Capillary  185 (*)  70 - 99 mg/dL    Comment 1  Notify RN    GLUCOSE, CAPILLARY Status: Abnormal    Collection Time    06/27/12 10:00 PM   Result  Value  Range    Glucose-Capillary  184 (*)  70 - 99 mg/dL   PROTIME-INR Status: Abnormal    Collection Time    06/28/12 5:00 AM   Result  Value  Range    Prothrombin Time  22.2 (*)  11.6 - 15.2 seconds    INR  2.04 (*)  0.00 - 1.49   BASIC METABOLIC PANEL Status: Abnormal    Collection Time    06/28/12 5:00 AM   Result  Value  Range    Sodium  140  135 - 145 mEq/L    Potassium  3.9  3.5 - 5.1 mEq/L    Chloride  101  96 - 112 mEq/L    CO2  29  19 - 32 mEq/L    Glucose, Bld  169 (*)  70 - 99 mg/dL    BUN  37 (*)  6 - 23 mg/dL    Creatinine, Ser  7.82 (*)  0.50 - 1.35 mg/dL    Calcium  9.3  8.4 - 10.5 mg/dL    GFR calc non Af Amer  28 (*)  >90 mL/min    GFR calc Af Amer  32 (*)  >90 mL/min    Comment:      The eGFR has been calculated     using the CKD EPI equation.     This calculation has not been     validated in all clinical     situations.     eGFR's persistently     <90 mL/min signify     possible Chronic Kidney Disease.   GLUCOSE, CAPILLARY Status: Abnormal    Collection Time    06/28/12 5:30 AM   Result  Value  Range    Glucose-Capillary  172 (*)  70 - 99 mg/dL       HPI  Mr. Garfield is a 79 year old white male patient with atrial fibrillation, HIT, HTN, previous CVA, PUD, CRI (1.5-2.0) and CHF secondary to diastolic dysfunction. He has CAD and is s/p CABG in 1999. 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   Admitted with fevers, sob and volume overload. Found to have PNA and mild HF.  CXR with large L effusion and possible infiltrate. Echo showed new systolic HF with EF 30-35%, mod LVH with moderate concentric hypertrophy. LA mod dilated. RA mildly dilated.  Thoracentesis on  hold per PCCM as pleural effusion is stable and u/s showed minimal effusion.    HOSPITAL COURSE:  Shortness of breath  HCAP with Lt pleural effusion. acute on chronic systolic/diastolic CHF  Clinically improving, and has stable findings on CXR. Family reluctant to have interventions at this time. Small effusion on bedside u/s 5/14.  Has been on vancomycin and asked urinalysis 5/9,  Has completed 7 day course of broad-spectrum antibiotics  plan for diagnostic/therapeutic thoracentesis by Dr. Craige Cotta today  950 ml of cloudy amber pleural fluid was obtained -f/u CXR intermittently to monitor for resolution of infiltrate and effusion  DNR/DNI   Hx of A fib on coumadin.  Metoprolol changed to Toprol-XL    Acute on chronic systolic/diastolic CHF .  Elevated pro BNP and pulmonary vascular congestion on CXR  Diuresed with IV lasix 40 mg bid. On 80 mg po daily at home which will be continued.  2D echo results show severely reduced EF of 30-35%. Comments on possible vegetation on aortic valve. Discussed with cardiology. Low EF appears to be new. Recommend to follow with increased diuresis.  Holding ARB given renal insufficiency. No recent baseline renal fn known. Added metoprolol and hydralazine    CKD  no recent baseline. As per son renal function has been worsening. Continue lasix for now and monitor. Holding ARB . BMP weekly   DM  Patient on quite high dose of lantus ( 60 units bid). reduced to 30 bid  patient advised to monitor Accu-Cheks closely and hemoglobin A1C 6.6.   AMS  Present on admission ad presumed due to pneumonia.  Had a code stroke called on 5/12. No focal neurological deficit. seemS to have word finding difficulty with delirium. Head CT and MRI brain negative. Seen by neurology. It appears patient has some underlying dementia which has been acute worsened with change in environment and underlying infection.  TSH within normal limits  Vitamin B12 260, started on vitamin B12  supplementation   afib  rate controlled. On coumadin .  Continue home dose   Hypertension  continue lasix, as needed zaroxolyn, ,   hypothyroidism  Continue synthroid    Discharge Exam:  Blood pressure 142/73, pulse 85, temperature 98.3 F (36.8 C), temperature source Oral, resp. rate 18, height 5\' 10"  (1.778 m), weight 92.67 kg (204 lb 4.8 oz), SpO2 98.00%.  General: Elderly. Sitting in chair. No resp difficulty.  HEENT: normal  Neck: supple. JVP 7-8 . Carotids 2+ bilat; no bruits. No lymphadenopathy or thryomegaly appreciated.  Cor: PMI nonpalpable. Distant HS Irregular rate & rhythm. No rubs, gallops or murmurs.  Lungs: clear. Decreased L base.  Abdomen: soft, nontender, nondistended. No hepatosplenomegaly. No bruits or masses. Good bowel sounds.  Extremities: no cyanosis, clubbing, rash, edema  Neuro: alert. Conversant moves all 4 extremities w/o difficulty.

## 2012-06-29 NOTE — Progress Notes (Signed)
Pt discharged with son and wife, nose bleed stopped, discharged by wheelchair by Nurse tech. Discharge packet given to son.

## 2012-06-29 NOTE — Progress Notes (Signed)
PULMONARY  / CRITICAL CARE MEDICINE  Name: Wayne Montoya MRN: [id number] DOB: 1922-07-18    ADMISSION DATE:  06/22/2012 CONSULTATION DATE:  5/12  REFERRING MD :  Bensimhon PRIMARY SERVICE:  [facility]  CHIEF COMPLAINT:  Pleural effusion  BRIEF PATIENT DESCRIPTION:  79 yo male from SNF with fever, dyspnea, and confusion from HCAP, acute on chronic diastolic CHF.  Found to have Lt pleural effusion on CXR, and PCCM consulted to further assess.   SIGNIFICANT EVENTS / STUDIES:  5/11 - 2D echo>> EF 30-35%. Mod LVH, mod/severely reduced systolic function, diffuse hypokinesis, mod dilated LA  5/12 - Code stroke called in PM, sx resolving, Neurology following 5/13 - Resolved agitation, afebrile, reduced WBC 5/14 - bedside u/s >> minimal Lt pleural effusion 5/14 - CT chest >> moderate LT pleural effusion with compressive atelectasis 5/15 - Lt thoracentesis >> 950 ml, glucose 199, protein 3.3, LDH 121, WBC 475 (81% Lymphs)  CULTURES: Urine 5/9>>> neg   ANTIBIOTICS: Aztreonam 5/9>>> Vanc 5/9>>>  SUBJECTIVE:    Denies chest pain.  VITAL SIGNS: Temp:  [98.2 F (36.8 C)-99.5 F (37.5 C)] 99.5 F (37.5 C) (05/16 1404) Pulse Rate:  [75-96] 83 (05/16 1404) Resp:  [18-20] 20 (05/16 1404) BP: (114-149)/(57-77) 114/57 mmHg (05/16 1404) SpO2:  [93 %-95 %] 93 % (05/16 1404) Weight:  [202 lb 6.1 oz (91.8 kg)] 202 lb 6.1 oz (91.8 kg) (05/16 0534) Room Air  PHYSICAL EXAMINATION: General: No distress Neuro: Sleepy, wakes up easily, follows commands HEENT:  No sinus tenderness Cardiovascular: irregular Lungs: decreased breath sounds at bases Lt > Rt Abdomen: soft, non tender Ext: no edema   Recent Labs Lab 06/27/12 0545 06/28/12 0500 06/29/12 0500  NA 139 140 134*  K 3.9 3.9 4.4  CL 101 101 96  CO2 30 29 27   BUN 35* 37* 41*  CREATININE 1.91* 2.02* 2.19*  GLUCOSE 172* 169* 187*    Recent Labs Lab 06/22/12 1907 06/23/12 0530 06/24/12 0434  HGB 13.3 12.6* 12.6*  HCT 40.6 39.0  39.4  WBC 12.4* 11.0* 8.6  PLT 140* 136* 145*   Ct Chest Wo Contrast  06/27/2012   *RADIOLOGY REPORT*  Clinical Data: Shortness of breath.  Left effusion.  CT CHEST WITHOUT CONTRAST  Technique:  Multidetector CT imaging of the chest was performed following the standard protocol without IV contrast.  Comparison: Plain films 06/26/2012  Findings: There is a moderate-sized left pleural effusion. Airspace disease in the lingula and left lower lobe likely reflects compressive atelectasis.  No pleural effusion on the right. Minimal bibasilar atelectasis.  There is cardiomegaly.  Prior CABG.  Vascular congestion present. No overt edema.  There are scattered small and borderline sized mediastinal lymph nodes.  No definite hilar adenopathy.  No axillary adenopathy.  Visualized thyroid and chest wall soft tissues unremarkable. Imaging into the upper abdomen shows no acute findings.  IMPRESSION: Moderate left pleural effusion.  Compressive atelectasis in the lingula and left lower lobe.  Cardiomegaly, vascular congestion.  Prior CABG.  Borderline sized scattered mediastinal lymph nodes.   Original Report Authenticated By: Charlett Nose, M.D.   Dg Chest Port 1 View  06/28/2012   *RADIOLOGY REPORT*  Clinical Data: Post thoracentesis  PORTABLE CHEST - 1 VIEW  Comparison: CT 06/27/2012  Findings: Decreased left pleural effusion following thoracentesis. Negative for pneumothorax. Improvement in left lower lobe atelectasis.  Mild vascular congestion is unchanged.  The right lung remains clear.  IMPRESSION: Decreased left pleural effusion following thoracentesis.  No pneumothorax.  Original Report Authenticated By: Janeece Riggers, M.D.    ASSESSMENT / PLAN:  HCAP. Plan: -continue Abx per primary team  Transudative Lt pleural effusion. Plan: -negative fluid balance per cardiology  Acute hypoxic respiratory failure 2nd to HCAP, pleural effusion, and acute on chronic systolic/diastolic CHF. Pt is  DNR/DNI. Plan: -oxygen to keep SpO2 > 92%  Acute on chronic systolic/diastolic CHF. Hx of A fib on coumadin. Plan: -per primary team and cardiology -negative fluid balance as tolerated -?if he is a good candidate for continued coumadin use  Hx of CVA, dementia with acute change in mental status 5/13. Plan: -primary team  PCCM will sign off.  Please call if additional help needed.  Coralyn Helling, MD [facility] Pulmonary/Critical Care 06/29/2012, 2:22 PM Pager:  [phone] After 3pm call: [phone]

## 2012-06-29 NOTE — Progress Notes (Signed)
06/29/12 1330 Pt. to dc back to [facility] today.  Noted HH orders.  TC to [facility] to inquire of [facility] agency they currently use, and was told by Elnita Maxwell that they use [facility].  Faxed facesheet, HH orders, physical therapy notes, history and physical, and progress notes to [phone]).   Tera Mater, RN, BSN NCM [phone]

## 2012-07-01 LAB — BODY FLUID CULTURE: Culture: NO GROWTH

## 2012-07-02 NOTE — Progress Notes (Signed)
OK per MD for d/c today back to [facility] [place] via car with his son. RNCM- Ivonne Andrew arranged Home Health for patient and Palliative Care will follow. Ok per facility for return today; notified patient's nurse of d/c plan.  No further CSW needs identified.  CSW signing off.  Lorri Frederick. [place]  [phone]

## 2012-07-10 ENCOUNTER — Ambulatory Visit (HOSPITAL_COMMUNITY)
Admit: 2012-07-10 | Discharge: 2012-07-10 | Disposition: A | Discharge status: Home / Self Care | Payer: Medicare Other | Source: Ambulatory Visit | Attending: Internal Medicine | Admitting: Internal Medicine

## 2012-07-10 ENCOUNTER — Encounter (HOSPITAL_COMMUNITY): Payer: Self-pay

## 2012-07-10 VITALS — BP 140/70 | HR 76 | Wt 211.8 lb

## 2012-07-10 DIAGNOSIS — I25810 Atherosclerosis of coronary artery bypass graft(s) without angina pectoris: Secondary | ICD-10-CM

## 2012-07-10 DIAGNOSIS — I4891 Unspecified atrial fibrillation: Secondary | ICD-10-CM

## 2012-07-10 DIAGNOSIS — I509 Heart failure, unspecified: Secondary | ICD-10-CM | POA: Insufficient documentation

## 2012-07-10 DIAGNOSIS — I5022 Chronic systolic (congestive) heart failure: Secondary | ICD-10-CM | POA: Insufficient documentation

## 2012-07-10 DIAGNOSIS — J90 Pleural effusion, not elsewhere classified: Secondary | ICD-10-CM

## 2012-07-10 MED ORDER — METOPROLOL SUCCINATE ER 25 MG PO TB24: 25.0000 mg | ORAL_TABLET | Freq: Two times a day (BID) | ORAL | Status: DC

## 2012-07-10 MED ORDER — HYDRALAZINE HCL 25 MG PO TABS: 25.0000 mg | ORAL_TABLET | Freq: Three times a day (TID) | ORAL | Status: AC

## 2012-07-10 MED ORDER — FUROSEMIDE 80 MG PO TABS: 80.0000 mg | ORAL_TABLET | Freq: Every day | ORAL | Status: DC

## 2012-07-10 NOTE — Assessment & Plan Note (Signed)
No evidence of ischemia. Continue current regimen.   

## 2012-07-10 NOTE — Assessment & Plan Note (Signed)
I suspect EF down in the setting of his illness from PNA. Hopefully EF will recover quickly. Volume status looks OK today. Will keep lasix at 80 daily. Increase to BID for weight >= to 211 pounds. Increase Toprol to 25 bid. Continue hydralazine. No ACE-I due to renal dysfunction. Repeat echo at next visit.

## 2012-07-10 NOTE — Progress Notes (Signed)
Patient ID: Wayne Montoya, male   DOB: 02/09/1923, 79 y.o.   MRN: [id number]  PCP: Junious Dresser, MD - [facility]  HPI:  Wayne Montoya is a 79 year old white male patient with atrial fibrillation, HIT, HTN, previous CVA, PUD, CRI (1.5-2.0)CHF secondary to diastolic dysfunction. He has CAD and is s/p CABG in 1999. 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   Myoview 4/11: EF 61% no ischemia or infarct.  Carotids 0-39% B (9/12)  Admitted 2 weeks ago for PNA c/b acute HF. EF newly down to 30-35%. Diuresed 10 pounds. Weight on d/c was 204. Also underwent tap of large pleural effusion (950 cc). Analysis was transudate. Hospitalization c/b acute delirium which resolved.   He is here for follow up with his son and daughter-in-law for f/u.  He lives in assisted living [facility]).  Getting weighed daily. Today weight was 208. Cough resolved. Able to walk from his room to dining room without problem. No orthopnea/PND/CP. Son says he is getting close to his baseline. No falls or dizziness.   No problem with coumadin.  Taking lasix 80 mg daily.    ROS: All systems negative except as listed in HPI, PMH and Problem List.  Past Medical History  Diagnosis Date  . CHF (congestive heart failure)     secondary to diastolic function. Echo 4/09: EF 55%  . CAD (coronary artery disease)     status post CABG in 1996  . Paroxysmal atrial fibrillation   . Diabetes mellitus     type was not specified  . HTN (hypertension)   . Hyperlipidemia   . Chronic renal insufficiency     with a base line creatinine of 1.6  . Cerebrovascular accident     Hx of it, Left sided facial droop  . Depression   . Carotid bruit     s/p carotids in 2006, showed mild hard plaque in the bulbs and proximal bifurcations, left greater that right. Both ICAs take steep posterior dives. Right ICA 0-39% stenosis; left ICA 40-59% stenosis.  Carrotids 9/10: 0-39%B  . BPH (benign  prostatic hyperplasia)     Hx of it.  . S/P cholecystectomy   . Fall     C2 fracture/ 12/15/08    Current Outpatient Prescriptions  Medication Sig Dispense Refill  . acetaminophen (TYLENOL) 500 MG tablet Take 1,000 mg by mouth every 4 (four) hours as needed for pain or fever.      Marland Kitchen alum & mag hydroxide-simeth (MAALOX/MYLANTA) 200-200-20 MG/5ML suspension Take 30 mLs by mouth every 6 (six) hours as needed for indigestion.      Marland Kitchen anti-nausea (EMETROL) solution Take 30 mLs by mouth every 30 (thirty) minutes as needed for nausea (up to 4 times a day).      . Artificial Tears 0.1-0.3 % SOLN Place 1 drop into both eyes 3 (three) times daily.       Marland Kitchen atorvastatin (LIPITOR) 80 MG tablet Take 80 mg by mouth daily.      . cyanocobalamin (,VITAMIN B-12,) 1000 MCG/ML injection Inject 1 mL (1,000 mcg total) into the muscle once.  1 mL  20  . furosemide (LASIX) 80 MG tablet Take 1 tablet (80 mg total) by mouth daily.      . hydrALAZINE (APRESOLINE) 25 MG tablet Take 1 tablet (25 mg total) by mouth every 8 (eight) hours.  90 tablet  10  . insulin aspart (NOVOLOG) 100 UNIT/ML injection Inject 12 Units into the  skin 3 (three) times daily with meals as needed for high blood sugar (hold if CBG is less than 100).      . insulin glargine (LANTUS) 100 UNIT/ML injection Inject 0.3 mLs (30 Units total) into the skin 2 (two) times daily.  10 mL  12  . isosorbide mononitrate (IMDUR) 30 MG 24 hr tablet Take 1 tablet (30 mg total) by mouth daily.  30 tablet  10  . levothyroxine (SYNTHROID, LEVOTHROID) 50 MCG tablet Take 50 mcg by mouth daily before breakfast.      . loperamide (IMODIUM) 2 MG capsule Take 2 mg by mouth 4 (four) times daily as needed for diarrhea or loose stools.      . magnesium hydroxide (MILK OF MAGNESIA) 400 MG/5ML suspension Take 30 mLs by mouth 2 (two) times daily as needed for constipation.      . metolazone (ZAROXOLYN) 2.5 MG tablet Take 2.5 mg by mouth daily as needed (when weight is above 220  pounds.). 30 minutes prior to lasix  when weight is above 220 pounds.      . metoprolol succinate (TOPROL XL) 25 MG 24 hr tablet Take 1 tablet (25 mg total) by mouth daily.  30 tablet  10  . Multiple Vitamin (MULTIVITAMIN) capsule Take 1 capsule by mouth daily.       Marland Kitchen omeprazole (PRILOSEC) 20 MG capsule Take 20 mg by mouth daily.        . potassium chloride SA (K-DUR,KLOR-CON) 20 MEQ tablet Take 20 mEq by mouth daily.       . sodium phosphate (FLEET) enema Place 1 enema rectally as needed (if constipation not relieved by milk of magnesia). follow package directions      . warfarin (COUMADIN) 5 MG tablet Take 5 mg by mouth every other day.      . warfarin (COUMADIN) 7.5 MG tablet Take 7.5 mg by mouth every other day.      . NON FORMULARY Diabetic test strips and lancets. Any brand. Check 4 times a day        No current facility-administered medications for this encounter.     PHYSICAL EXAM: Filed Vitals:   07/10/12 1027  BP: 140/70  Pulse: 76  Weight: 211 lb 12.8 oz (96.072 kg)  SpO2: 98%    General:  Elderly. No acute distress Neck: Supple. JVP 7 Carotids 2+ no bruits.  Lungs: Clear with decreased BS 1/3 to 1/2 up on L Cardiovascular: Irregular, PMI not displaced, heart sounds normal  and 2/6 systolic ejection murmur at the left sternal border  Abdomen: BS normal. Soft without organomegaly, masses, lesions or tenderness. Extremities: Tr  Edema. No cyanosis or clubbing.  SKin: Warm, no lesions or rashes  Neuro: no focal signs   ASSESSMENT & PLAN:

## 2012-07-10 NOTE — Assessment & Plan Note (Signed)
Chronic. Rate controlled. Continue coumadin. F/u INR closely per ALF.

## 2012-07-10 NOTE — Patient Instructions (Signed)
Hydralazine 25 mg TID, give at 8am, 2pm and 8 pm Increase Toprol to 25 mg BID Lasix 80 mg daily, if wt greater than or equal to 211 give extra dose of 80 mg in PM   Follow up in 1 month with chest xray and echocardiogram

## 2012-07-10 NOTE — Addendum Note (Signed)
Encounter addended by: Noralee Space, RN on: 07/10/2012 11:18 AM<BR>     Documentation filed: Patient Instructions Section, Orders

## 2012-07-10 NOTE — Assessment & Plan Note (Signed)
Remains dull on left side to auscultation. Will need f/u CXR at next visit; sooner if symptomatic.

## 2012-08-09 ENCOUNTER — Other Ambulatory Visit (HOSPITAL_COMMUNITY): Payer: Medicare Other

## 2012-08-09 ENCOUNTER — Encounter (HOSPITAL_COMMUNITY): Payer: Medicare Other

## 2012-08-09 ENCOUNTER — Ambulatory Visit (HOSPITAL_COMMUNITY): Payer: Medicare Other

## 2012-08-16 ENCOUNTER — Ambulatory Visit (HOSPITAL_BASED_OUTPATIENT_CLINIC_OR_DEPARTMENT_OTHER)
Admission: RE | Admit: 2012-08-16 | Discharge: 2012-08-16 | Disposition: A | Discharge status: Home / Self Care | Payer: Medicare Other | Source: Ambulatory Visit | Attending: Internal Medicine | Admitting: Internal Medicine

## 2012-08-16 ENCOUNTER — Ambulatory Visit (HOSPITAL_COMMUNITY)
Admission: RE | Admit: 2012-08-16 | Discharge: 2012-08-16 | Disposition: A | Discharge status: Home / Self Care | Payer: Medicare Other | Source: Ambulatory Visit | Attending: Internal Medicine | Admitting: Internal Medicine

## 2012-08-16 ENCOUNTER — Encounter (HOSPITAL_COMMUNITY): Payer: Self-pay

## 2012-08-16 VITALS — BP 145/75 | HR 73 | Wt 209.4 lb

## 2012-08-16 DIAGNOSIS — I4892 Unspecified atrial flutter: Secondary | ICD-10-CM | POA: Insufficient documentation

## 2012-08-16 DIAGNOSIS — R0602 Shortness of breath: Secondary | ICD-10-CM

## 2012-08-16 DIAGNOSIS — I2510 Atherosclerotic heart disease of native coronary artery without angina pectoris: Secondary | ICD-10-CM | POA: Insufficient documentation

## 2012-08-16 DIAGNOSIS — I5022 Chronic systolic (congestive) heart failure: Secondary | ICD-10-CM

## 2012-08-16 DIAGNOSIS — I517 Cardiomegaly: Secondary | ICD-10-CM

## 2012-08-16 DIAGNOSIS — E785 Hyperlipidemia, unspecified: Secondary | ICD-10-CM | POA: Insufficient documentation

## 2012-08-16 DIAGNOSIS — J90 Pleural effusion, not elsewhere classified: Secondary | ICD-10-CM | POA: Insufficient documentation

## 2012-08-16 DIAGNOSIS — I4891 Unspecified atrial fibrillation: Secondary | ICD-10-CM

## 2012-08-16 DIAGNOSIS — I509 Heart failure, unspecified: Secondary | ICD-10-CM | POA: Insufficient documentation

## 2012-08-16 LAB — BASIC METABOLIC PANEL
CO2: 26 mEq/L (ref 19–32)
Calcium: 9.4 mg/dL (ref 8.4–10.5)
Creatinine, Ser: 1.49 mg/dL — ABNORMAL HIGH (ref 0.50–1.35)

## 2012-08-16 LAB — CBC
MCH: 26.7 pg (ref 26.0–34.0)
MCHC: 33.1 g/dL (ref 30.0–36.0)
MCV: 80.7 fL (ref 78.0–100.0)
Platelets: 135 10*3/uL — ABNORMAL LOW (ref 150–400)
RDW: 16.2 % — ABNORMAL HIGH (ref 11.5–15.5)
WBC: 7.9 10*3/uL (ref 4.0–10.5)

## 2012-08-16 NOTE — Addendum Note (Signed)
Encounter addended by: Noralee Space, RN on: 08/16/2012 10:44 AM<BR>     Documentation filed: Patient Instructions Section, Orders

## 2012-08-16 NOTE — Progress Notes (Signed)
Patient ID: Wayne Montoya, male   DOB: May 01, 1922, 79 y.o.   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 He has CAD and is s/p CABG in 1999. 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   Myoview 4/11: EF 61% no ischemia or infarct.  Carotids 0-39% B (9/12)  Admitted in early to mid May 2014 for PNA c/b acute HF. EF newly down to 30-35%. Diuresed 10 pounds. Weight on d/c was 204. Also underwent tap of large pleural effusion (950 cc). Analysis was transudate. Cytology negative.Hospitalization c/b acute delirium which resolved  Bloodwork from 5/28 was ok k 4.3 Cr 1.7 (down from 2.1).   He is here for follow up with his son and daughter-in-law for f/u.  He lives in assisted living [facility]).  Getting weighed daily. Weight stable 208-210.  Taking lasix 80 mg daily.  No extra doses needed.  Able to walk from his room to dining room without problem - uses walker. Functional capacity actually improving. No orthopnea/PND/CP.  No falls or dizziness. Sleeping a lot.   No problem with coumadin.   Echo today reviewed personally EF 30-35%. Large left effusion  CXR: large left effusion. No CHF  ROS: All systems negative except as listed in HPI, PMH and Problem List.  Past Medical History  Diagnosis Date  . CHF (congestive heart failure)     secondary to diastolic function. Echo 4/09: EF 55%  . CAD (coronary artery disease)     status post CABG in 1996  . Paroxysmal atrial fibrillation   . Diabetes mellitus     type was not specified  . HTN (hypertension)   . Hyperlipidemia   . Chronic renal insufficiency     with a base line creatinine of 1.6  . Cerebrovascular accident     Hx of it, Left sided facial droop  . Depression   . Carotid bruit     s/p carotids in 2006,  showed mild hard plaque in the bulbs and proximal bifurcations, left greater that right. Both ICAs take steep posterior dives. Right ICA 0-39% stenosis; left ICA 40-59% stenosis.  Carrotids 9/10: 0-39%B  . BPH (benign prostatic hyperplasia)     Hx of it.  . S/P cholecystectomy   . Fall     C2 fracture/ 12/15/08    Current Outpatient Prescriptions  Medication Sig Dispense Refill  . acetaminophen (TYLENOL) 500 MG tablet Take 1,000 mg by mouth every 4 (four) hours as needed for pain or fever.      Marland Kitchen alum & mag hydroxide-simeth (MAALOX/MYLANTA) 200-200-20 MG/5ML suspension Take 30 mLs by mouth every 6 (six) hours as needed for indigestion.      Marland Kitchen anti-nausea (EMETROL) solution Take 30 mLs by mouth every 30 (thirty) minutes as needed for nausea (up to 4 times a day).      . Artificial Tears 0.1-0.3 % SOLN Place 1 drop into both eyes 3 (three) times daily.       Marland Kitchen atorvastatin (LIPITOR) 80 MG tablet Take 80 mg by mouth daily.      . cyanocobalamin (,VITAMIN B-12,) 1000 MCG/ML injection Inject 1 mL (1,000 mcg total) into the muscle once.  1 mL  20  . furosemide (LASIX) 80 MG tablet Take 1 tablet (80 mg total) by mouth daily. Give extra 80  mg in PM if wt 211 or greater      . hydrALAZINE (APRESOLINE) 25 MG tablet Take 1 tablet (25 mg total) by mouth 3 (three) times daily. Give at 8am, 2pm, and 8pm  90 tablet  10  . insulin aspart (NOVOLOG) 100 UNIT/ML injection Inject 12 Units into the skin 3 (three) times daily with meals as needed for high blood sugar (hold if CBG is less than 100).      . insulin glargine (LANTUS) 100 UNIT/ML injection Inject 0.3 mLs (30 Units total) into the skin 2 (two) times daily.  10 mL  12  . isosorbide mononitrate (IMDUR) 30 MG 24 hr tablet Take 1 tablet (30 mg total) by mouth daily.  30 tablet  10  . levothyroxine (SYNTHROID, LEVOTHROID) 50 MCG tablet Take 50 mcg by mouth daily before breakfast.      . loperamide (IMODIUM) 2 MG capsule Take 2 mg by mouth 4 (four) times daily  as needed for diarrhea or loose stools.      . magnesium hydroxide (MILK OF MAGNESIA) 400 MG/5ML suspension Take 30 mLs by mouth 2 (two) times daily as needed for constipation.      . metolazone (ZAROXOLYN) 2.5 MG tablet Take 2.5 mg by mouth daily as needed (when weight is above 220 pounds.). 30 minutes prior to lasix  when weight is above 220 pounds.      . metoprolol succinate (TOPROL XL) 25 MG 24 hr tablet Take 1 tablet (25 mg total) by mouth 2 (two) times daily.  30 tablet  10  . Multiple Vitamin (MULTIVITAMIN) capsule Take 1 capsule by mouth daily.       . NON FORMULARY Diabetic test strips and lancets. Any brand. Check 4 times a day       . omeprazole (PRILOSEC) 20 MG capsule Take 20 mg by mouth daily.        . potassium chloride SA (K-DUR,KLOR-CON) 20 MEQ tablet Take 20 mEq by mouth daily.       . sodium phosphate (FLEET) enema Place 1 enema rectally as needed (if constipation not relieved by milk of magnesia). follow package directions      . warfarin (COUMADIN) 5 MG tablet Take 5 mg by mouth every other day.      . warfarin (COUMADIN) 7.5 MG tablet Take 7.5 mg by mouth every other day.       No current facility-administered medications for this encounter.     PHYSICAL EXAM: Filed Vitals:   08/16/12 0947  BP: 145/75  Pulse: 73  Weight: 209 lb 6.4 oz (94.983 kg)  SpO2: 97%    General:  Elderly. No acute distress Neck: Supple. JVP 7 Carotids 2+ no bruits.  Lungs: Clear with decreased BS 1/3 to 1/2 up on L Cardiovascular: Irregular, PMI not displaced, heart sounds normal  and 2/6 systolic ejection murmur at the left sternal border  Abdomen: BS normal. Soft without organomegaly, masses, lesions or tenderness. Extremities: Tr  Edema. No cyanosis or clubbing.  SKin: Warm, no lesions or rashes  Neuro: no focal signs   ASSESSMENT & PLAN:

## 2012-08-16 NOTE — Assessment & Plan Note (Signed)
Chronic. Rate controlled. Tolerating coumadin.

## 2012-08-16 NOTE — Progress Notes (Signed)
Echo Lab  2D Echocardiogram completed.  Wayne Montoya, RDCS 08/16/2012 9:43 AM   

## 2012-08-16 NOTE — Assessment & Plan Note (Signed)
Volume status looks good. EF remains 30-35% range. Will not titrate meds due to previous symptomatic hypotension with fall.

## 2012-08-16 NOTE — Patient Instructions (Signed)
Labs today  We will contact you in 2 months to schedule your next appointment.  

## 2012-08-16 NOTE — Assessment & Plan Note (Signed)
This is recurrent and large. Was transudative previously but did not respond to diuresis in hospital. We discussed options. I think it is reasonable to attempt one more thoracentesis now that he is over 1 month out from his PNA. However, I do suspect that it may return. If it does return will need to consider Pleurex vs leaving it alone. Will arrange for thoracentesis next week as schedule permits. Will need to hold coumadin for a few days.

## 2012-08-26 ENCOUNTER — Emergency Department (HOSPITAL_COMMUNITY): Payer: Medicare Other

## 2012-08-26 ENCOUNTER — Encounter (HOSPITAL_COMMUNITY): Payer: Self-pay | Admitting: *Deleted

## 2012-08-26 ENCOUNTER — Inpatient Hospital Stay (HOSPITAL_COMMUNITY)
Admission: EM | Admit: 2012-08-26 | Discharge: 2012-09-05 | DRG: 480 | Disposition: A | Discharge status: Skilled Nursing Facility | Payer: Medicare Other | Attending: Internal Medicine | Admitting: Internal Medicine

## 2012-08-26 ENCOUNTER — Inpatient Hospital Stay (HOSPITAL_COMMUNITY): Payer: Medicare Other

## 2012-08-26 DIAGNOSIS — J189 Pneumonia, unspecified organism: Secondary | ICD-10-CM

## 2012-08-26 DIAGNOSIS — Z8673 Personal history of transient ischemic attack (TIA), and cerebral infarction without residual deficits: Secondary | ICD-10-CM

## 2012-08-26 DIAGNOSIS — D75829 Heparin-induced thrombocytopenia, unspecified: Secondary | ICD-10-CM | POA: Diagnosis present

## 2012-08-26 DIAGNOSIS — Z951 Presence of aortocoronary bypass graft: Secondary | ICD-10-CM

## 2012-08-26 DIAGNOSIS — J449 Chronic obstructive pulmonary disease, unspecified: Secondary | ICD-10-CM | POA: Diagnosis present

## 2012-08-26 DIAGNOSIS — R5383 Other fatigue: Secondary | ICD-10-CM

## 2012-08-26 DIAGNOSIS — I517 Cardiomegaly: Secondary | ICD-10-CM

## 2012-08-26 DIAGNOSIS — R410 Disorientation, unspecified: Secondary | ICD-10-CM

## 2012-08-26 DIAGNOSIS — I5042 Chronic combined systolic (congestive) and diastolic (congestive) heart failure: Secondary | ICD-10-CM | POA: Diagnosis present

## 2012-08-26 DIAGNOSIS — W010XXA Fall on same level from slipping, tripping and stumbling without subsequent striking against object, initial encounter: Secondary | ICD-10-CM | POA: Diagnosis present

## 2012-08-26 DIAGNOSIS — F3289 Other specified depressive episodes: Secondary | ICD-10-CM | POA: Diagnosis present

## 2012-08-26 DIAGNOSIS — E785 Hyperlipidemia, unspecified: Secondary | ICD-10-CM | POA: Diagnosis present

## 2012-08-26 DIAGNOSIS — J95821 Acute postprocedural respiratory failure: Secondary | ICD-10-CM

## 2012-08-26 DIAGNOSIS — J9600 Acute respiratory failure, unspecified whether with hypoxia or hypercapnia: Secondary | ICD-10-CM | POA: Diagnosis not present

## 2012-08-26 DIAGNOSIS — Z91199 Patient's noncompliance with other medical treatment and regimen due to unspecified reason: Secondary | ICD-10-CM

## 2012-08-26 DIAGNOSIS — I5021 Acute systolic (congestive) heart failure: Secondary | ICD-10-CM

## 2012-08-26 DIAGNOSIS — S12100S Unspecified displaced fracture of second cervical vertebra, sequela: Secondary | ICD-10-CM

## 2012-08-26 DIAGNOSIS — Z66 Do not resuscitate: Secondary | ICD-10-CM | POA: Diagnosis present

## 2012-08-26 DIAGNOSIS — Y92009 Unspecified place in unspecified non-institutional (private) residence as the place of occurrence of the external cause: Secondary | ICD-10-CM

## 2012-08-26 DIAGNOSIS — Z9119 Patient's noncompliance with other medical treatment and regimen: Secondary | ICD-10-CM

## 2012-08-26 DIAGNOSIS — IMO0002 Reserved for concepts with insufficient information to code with codable children: Secondary | ICD-10-CM

## 2012-08-26 DIAGNOSIS — Z01811 Encounter for preprocedural respiratory examination: Secondary | ICD-10-CM | POA: Insufficient documentation

## 2012-08-26 DIAGNOSIS — I679 Cerebrovascular disease, unspecified: Secondary | ICD-10-CM

## 2012-08-26 DIAGNOSIS — J4489 Other specified chronic obstructive pulmonary disease: Secondary | ICD-10-CM | POA: Diagnosis present

## 2012-08-26 DIAGNOSIS — D649 Anemia, unspecified: Secondary | ICD-10-CM | POA: Diagnosis present

## 2012-08-26 DIAGNOSIS — E860 Dehydration: Secondary | ICD-10-CM | POA: Diagnosis not present

## 2012-08-26 DIAGNOSIS — S72141A Displaced intertrochanteric fracture of right femur, initial encounter for closed fracture: Secondary | ICD-10-CM

## 2012-08-26 DIAGNOSIS — Z87891 Personal history of nicotine dependence: Secondary | ICD-10-CM

## 2012-08-26 DIAGNOSIS — N183 Chronic kidney disease, stage 3 unspecified: Secondary | ICD-10-CM | POA: Diagnosis present

## 2012-08-26 DIAGNOSIS — J986 Disorders of diaphragm: Secondary | ICD-10-CM | POA: Diagnosis present

## 2012-08-26 DIAGNOSIS — J9601 Acute respiratory failure with hypoxia: Secondary | ICD-10-CM

## 2012-08-26 DIAGNOSIS — R531 Weakness: Secondary | ICD-10-CM

## 2012-08-26 DIAGNOSIS — S12100A Unspecified displaced fracture of second cervical vertebra, initial encounter for closed fracture: Secondary | ICD-10-CM

## 2012-08-26 DIAGNOSIS — I2510 Atherosclerotic heart disease of native coronary artery without angina pectoris: Secondary | ICD-10-CM | POA: Diagnosis present

## 2012-08-26 DIAGNOSIS — B351 Tinea unguium: Secondary | ICD-10-CM

## 2012-08-26 DIAGNOSIS — F329 Major depressive disorder, single episode, unspecified: Secondary | ICD-10-CM | POA: Diagnosis present

## 2012-08-26 DIAGNOSIS — S72143A Displaced intertrochanteric fracture of unspecified femur, initial encounter for closed fracture: Principal | ICD-10-CM | POA: Diagnosis present

## 2012-08-26 DIAGNOSIS — I5032 Chronic diastolic (congestive) heart failure: Secondary | ICD-10-CM

## 2012-08-26 DIAGNOSIS — D7582 Heparin induced thrombocytopenia (HIT): Secondary | ICD-10-CM

## 2012-08-26 DIAGNOSIS — N400 Enlarged prostate without lower urinary tract symptoms: Secondary | ICD-10-CM | POA: Diagnosis present

## 2012-08-26 DIAGNOSIS — I509 Heart failure, unspecified: Secondary | ICD-10-CM | POA: Diagnosis present

## 2012-08-26 DIAGNOSIS — F0390 Unspecified dementia without behavioral disturbance: Secondary | ICD-10-CM | POA: Diagnosis present

## 2012-08-26 DIAGNOSIS — I129 Hypertensive chronic kidney disease with stage 1 through stage 4 chronic kidney disease, or unspecified chronic kidney disease: Secondary | ICD-10-CM | POA: Diagnosis present

## 2012-08-26 DIAGNOSIS — Z794 Long term (current) use of insulin: Secondary | ICD-10-CM

## 2012-08-26 DIAGNOSIS — E119 Type 2 diabetes mellitus without complications: Secondary | ICD-10-CM | POA: Diagnosis present

## 2012-08-26 DIAGNOSIS — I4891 Unspecified atrial fibrillation: Secondary | ICD-10-CM | POA: Diagnosis present

## 2012-08-26 DIAGNOSIS — R0902 Hypoxemia: Secondary | ICD-10-CM

## 2012-08-26 DIAGNOSIS — I5022 Chronic systolic (congestive) heart failure: Secondary | ICD-10-CM | POA: Diagnosis present

## 2012-08-26 DIAGNOSIS — Z7901 Long term (current) use of anticoagulants: Secondary | ICD-10-CM

## 2012-08-26 DIAGNOSIS — E872 Acidosis, unspecified: Secondary | ICD-10-CM | POA: Diagnosis not present

## 2012-08-26 DIAGNOSIS — N179 Acute kidney failure, unspecified: Secondary | ICD-10-CM | POA: Diagnosis not present

## 2012-08-26 DIAGNOSIS — G459 Transient cerebral ischemic attack, unspecified: Secondary | ICD-10-CM

## 2012-08-26 DIAGNOSIS — J90 Pleural effusion, not elsewhere classified: Secondary | ICD-10-CM | POA: Diagnosis present

## 2012-08-26 DIAGNOSIS — I10 Essential (primary) hypertension: Secondary | ICD-10-CM | POA: Diagnosis present

## 2012-08-26 DIAGNOSIS — I25810 Atherosclerosis of coronary artery bypass graft(s) without angina pectoris: Secondary | ICD-10-CM

## 2012-08-26 LAB — POCT I-STAT, CHEM 8
Calcium, Ion: 1.18 mmol/L (ref 1.13–1.30)
HCT: 45 % (ref 39.0–52.0)
TCO2: 26 mmol/L (ref 0–100)

## 2012-08-26 LAB — CBC
MCH: 26.9 pg (ref 26.0–34.0)
MCHC: 32.9 g/dL (ref 30.0–36.0)
MCV: 81.9 fL (ref 78.0–100.0)
Platelets: 191 10*3/uL (ref 150–400)
RDW: 16.3 % — ABNORMAL HIGH (ref 11.5–15.5)
WBC: 8.8 10*3/uL (ref 4.0–10.5)

## 2012-08-26 LAB — APTT: aPTT: 88 seconds — ABNORMAL HIGH (ref 24–37)

## 2012-08-26 LAB — PROTIME-INR
INR: 1.59 — ABNORMAL HIGH (ref 0.00–1.49)
Prothrombin Time: 18.5 seconds — ABNORMAL HIGH (ref 11.6–15.2)

## 2012-08-26 LAB — COMPREHENSIVE METABOLIC PANEL
Albumin: 2.8 g/dL — ABNORMAL LOW (ref 3.5–5.2)
BUN: 25 mg/dL — ABNORMAL HIGH (ref 6–23)
Chloride: 102 mEq/L (ref 96–112)
Creatinine, Ser: 1.69 mg/dL — ABNORMAL HIGH (ref 0.50–1.35)
GFR calc Af Amer: 40 mL/min — ABNORMAL LOW (ref >90)
GFR calc non Af Amer: 34 mL/min — ABNORMAL LOW (ref >90)
Glucose, Bld: 141 mg/dL — ABNORMAL HIGH (ref 70–99)
Total Bilirubin: 0.8 mg/dL (ref 0.3–1.2)

## 2012-08-26 LAB — PROCALCITONIN: Procalcitonin: <0.1 ng/mL

## 2012-08-26 LAB — GLUCOSE, CAPILLARY
Glucose-Capillary: 116 mg/dL — ABNORMAL HIGH (ref 70–99)
Glucose-Capillary: 130 mg/dL — ABNORMAL HIGH (ref 70–99)

## 2012-08-26 LAB — PRO B NATRIURETIC PEPTIDE: Pro B Natriuretic peptide (BNP): 3413 pg/mL — ABNORMAL HIGH (ref 0–450)

## 2012-08-26 LAB — TYPE AND SCREEN: Antibody Screen: NEGATIVE

## 2012-08-26 LAB — ABO/RH: ABO/RH(D): A POS

## 2012-08-26 MED ORDER — PANTOPRAZOLE SODIUM 40 MG PO TBEC
40.0000 mg | DELAYED_RELEASE_TABLET | Freq: Every day | ORAL | Status: DC
  Administered 2012-08-26 – 2012-08-28 (×3): 40 mg via ORAL
  Filled 2012-08-26 (×4): qty 1

## 2012-08-26 MED ORDER — MORPHINE SULFATE 2 MG/ML IJ SOLN
0.5000 mg | INTRAMUSCULAR | Status: DC | PRN
  Administered 2012-08-26: 0.5 mg via INTRAVENOUS
  Administered 2012-08-26 (×2): 1 mg via INTRAVENOUS
  Filled 2012-08-26 (×3): qty 1

## 2012-08-26 MED ORDER — POTASSIUM CHLORIDE CRYS ER 20 MEQ PO TBCR
20.0000 meq | EXTENDED_RELEASE_TABLET | Freq: Two times a day (BID) | ORAL | Status: DC
  Filled 2012-08-26 (×2): qty 1

## 2012-08-26 MED ORDER — FENTANYL CITRATE 0.05 MG/ML IJ SOLN
50.0000 ug | INTRAMUSCULAR | Status: AC | PRN
  Administered 2012-08-26 (×2): 100 ug via INTRAVENOUS
  Filled 2012-08-26 (×2): qty 2

## 2012-08-26 MED ORDER — INSULIN ASPART 100 UNIT/ML ~~LOC~~ SOLN: 12.0000 [IU] | Freq: Three times a day (TID) | SUBCUTANEOUS | Status: DC | PRN

## 2012-08-26 MED ORDER — FENTANYL CITRATE 0.05 MG/ML IJ SOLN
INTRAMUSCULAR | Status: AC
  Administered 2012-08-26: 50 ug
  Filled 2012-08-26: qty 2

## 2012-08-26 MED ORDER — HYDROCODONE-ACETAMINOPHEN 5-325 MG PO TABS
1.0000 | ORAL_TABLET | Freq: Four times a day (QID) | ORAL | Status: DC | PRN
  Administered 2012-08-26 – 2012-08-27 (×4): 1 via ORAL
  Filled 2012-08-26 (×3): qty 1
  Filled 2012-08-26: qty 2
  Filled 2012-08-26: qty 1

## 2012-08-26 MED ORDER — ARGATROBAN 50 MG/50ML IV SOLN
93.0000 ug/min | INTRAVENOUS | Status: DC
  Administered 2012-08-26 – 2012-08-27 (×4): 93 ug/min via INTRAVENOUS
  Filled 2012-08-26 (×4): qty 50

## 2012-08-26 MED ORDER — MORPHINE SULFATE 2 MG/ML IJ SOLN
2.0000 mg | INTRAMUSCULAR | Status: DC | PRN
  Administered 2012-08-26 – 2012-08-27 (×3): 2 mg via INTRAVENOUS
  Filled 2012-08-26 (×2): qty 1

## 2012-08-26 MED ORDER — FUROSEMIDE 10 MG/ML IJ SOLN
40.0000 mg | Freq: Two times a day (BID) | INTRAMUSCULAR | Status: DC
  Administered 2012-08-26 – 2012-08-27 (×3): 40 mg via INTRAVENOUS
  Filled 2012-08-26 (×2): qty 4
  Filled 2012-08-26: qty 8
  Filled 2012-08-26 (×3): qty 4

## 2012-08-26 MED ORDER — ALUM & MAG HYDROXIDE-SIMETH 200-200-20 MG/5ML PO SUSP: 30.0000 mL | Freq: Four times a day (QID) | ORAL | Status: DC | PRN

## 2012-08-26 MED ORDER — SODIUM CHLORIDE 0.9 % IV SOLN
INTRAVENOUS | Status: DC
  Administered 2012-08-26: 10 mL via INTRAVENOUS
  Administered 2012-08-27: 10 mL/h via INTRAVENOUS
  Administered 2012-08-29: 11:00:00 via INTRAVENOUS

## 2012-08-26 MED ORDER — ATORVASTATIN CALCIUM 80 MG PO TABS
80.0000 mg | ORAL_TABLET | Freq: Every day | ORAL | Status: DC
  Administered 2012-08-26 – 2012-08-28 (×2): 80 mg via ORAL
  Filled 2012-08-26 (×4): qty 1

## 2012-08-26 MED ORDER — INSULIN ASPART 100 UNIT/ML ~~LOC~~ SOLN
0.0000 [IU] | SUBCUTANEOUS | Status: DC
  Administered 2012-08-26 – 2012-08-27 (×4): 2 [IU] via SUBCUTANEOUS

## 2012-08-26 MED ORDER — METOPROLOL SUCCINATE ER 25 MG PO TB24
25.0000 mg | ORAL_TABLET | Freq: Two times a day (BID) | ORAL | Status: DC
  Administered 2012-08-26 – 2012-08-28 (×5): 25 mg via ORAL
  Filled 2012-08-26 (×9): qty 1

## 2012-08-26 MED ORDER — MORPHINE SULFATE 2 MG/ML IJ SOLN
0.5000 mg | INTRAMUSCULAR | Status: DC | PRN
  Administered 2012-08-26: 0.5 mg via INTRAVENOUS
  Filled 2012-08-26: qty 1

## 2012-08-26 MED ORDER — HYDRALAZINE HCL 25 MG PO TABS
25.0000 mg | ORAL_TABLET | Freq: Three times a day (TID) | ORAL | Status: DC
  Administered 2012-08-26 – 2012-08-28 (×8): 25 mg via ORAL
  Filled 2012-08-26 (×13): qty 1

## 2012-08-26 MED ORDER — MORPHINE SULFATE 2 MG/ML IJ SOLN
INTRAMUSCULAR | Status: AC
  Filled 2012-08-26: qty 1

## 2012-08-26 MED ORDER — BISACODYL 5 MG PO TBEC: 5.0000 mg | DELAYED_RELEASE_TABLET | Freq: Every day | ORAL | Status: DC | PRN

## 2012-08-26 MED ORDER — ONDANSETRON HCL 4 MG/2ML IJ SOLN
4.0000 mg | Freq: Once | INTRAMUSCULAR | Status: AC
  Administered 2012-08-26: 4 mg via INTRAVENOUS
  Filled 2012-08-26: qty 2

## 2012-08-26 MED ORDER — ISOSORBIDE MONONITRATE ER 30 MG PO TB24
30.0000 mg | ORAL_TABLET | Freq: Every day | ORAL | Status: DC
  Administered 2012-08-26 – 2012-08-28 (×3): 30 mg via ORAL
  Filled 2012-08-26 (×4): qty 1

## 2012-08-26 MED ORDER — FUROSEMIDE 80 MG PO TABS: 80.0000 mg | ORAL_TABLET | Freq: Every day | ORAL | Status: DC

## 2012-08-26 NOTE — Consult Note (Addendum)
CARDIOLOGY CONSULT NOTE  Patient ID: Wayne Montoya MRN: [id number] DOB/AGE: 10/19/1922 79 y.o.  Admit date: 08/26/2012 Referring Physician: PTH-Vann DO  Primary PhysicianCAMPBELL, Mila Homer., MD Primary Cardiologist: Crenshaw/Bensimhon Reason for Consultation: Atrial fibrillation and ICM (CHF pt) Active Problems:   DIABETES MELLITUS, TYPE II   CAD, ARTERY BYPASS GRAFT   Pleural effusion, left   Atrial fibrillation   C2 cervical fracture   Intertrochanteric fracture of right hip  HPI: [name] is an 79 year old male patient to the ER from any assisted living facility, after a fall, tripping last evening and falling into his shower sustaining a comminuted intertrochanteric right hip fracture, and a new C2 subluxation on top of a C2 fracture. Patient is slightly confused, this is baseline for him. His son Shaunte Weissinger, RN)  who is an ICU RN provides most of the history.   He has a complicated medical history to include atrial fibrillation on Coumadin, CAD, status post CABG in 1999, chronic diastolic CHF, with newly diagnosed systolic dysfunction with recent echo completed in July of 2014 demonstrating EF of 30-35% followed by Dr. Arvilla Meres, participate in cardio[employer]S pulmonary artery sensory device trial, HIT, hypertension, history of previous CVA, peptic ulcer disease, chronic left effusion.    He was last seen by Dr. [name] on 08/16/2012. Recommended a thoracentesis in the setting of his large pleural effusion. This has not yet been completed. Chest x-ray completed this a.m. demonstrated moderate to large left pleural effusion with consolidation in the left lung base. No significant Change. We are asked for ongoing cardiology management of CHF and atrial fibrillation.    Labs on arrival demonstrated a sodium of 141 potassium 3.6 chloride 103 creatinine of 1.70 (slightly elevated from prior creatinine of 1.49 on 08/16/2012); hemoglobin 15.3 hematocrit 45.0. INR 1.59.  Review of systems  complete and found to be negative unless listed above   Past Medical History  Diagnosis Date  . CHF (congestive heart failure)     secondary to diastolic function. Echo 4/09: EF 55%  . CAD (coronary artery disease)     status post CABG in 1996  . Paroxysmal atrial fibrillation   . Diabetes mellitus     type was not specified  . HTN (hypertension)   . Hyperlipidemia   . Chronic renal insufficiency     with a base line creatinine of 1.6  . Cerebrovascular accident     Hx of it, Left sided facial droop  . Depression   . Carotid bruit     s/p carotids in 2006, showed mild hard plaque in the bulbs and proximal bifurcations, left greater that right. Both ICAs take steep posterior dives. Right ICA 0-39% stenosis; left ICA 40-59% stenosis.  Carrotids 9/20: 0-39%  . BPH (benign prostatic hyperplasia)     Hx of it.  . S/P cholecystectomy   . Fall     C2 fracture/ 12/15/08  . Dementia   Family history: Unobtainable due to confusion. History   Social History  . Marital Status: Widowed    Spouse Name: N/A    Number of Children: N/A  . Years of Education: N/A   Occupational History  . Not on file.   Social History Main Topics  . Smoking status: Former Smoker    Quit date: 06/22/1972  . Smokeless tobacco: Not on file  . Alcohol Use: No  . Drug Use: No  . Sexually Active: Not on file   Other Topics Concern  . Not on file  Social History Narrative   Single     Past Surgical History  Procedure Laterality Date  . Coronary artery bypass graft       Prescriptions prior to admission  Medication Sig Dispense Refill  . Artificial Tears 0.1-0.3 % SOLN Place 1 drop into both eyes 3 (three) times daily.       Marland Kitchen atorvastatin (LIPITOR) 80 MG tablet Take 80 mg by mouth daily.      . furosemide (LASIX) 80 MG tablet Take 1 tablet (80 mg total) by mouth daily. Give extra 80 mg in PM if wt 211 or greater      . hydrALAZINE (APRESOLINE) 25 MG tablet Take 1 tablet (25 mg total) by mouth 3  (three) times daily. Give at 8am, 2pm, and 8pm  90 tablet  10  . insulin aspart (NOVOLOG) 100 UNIT/ML injection Inject 12 Units into the skin 3 (three) times daily with meals as needed for high blood sugar (hold if CBG is less than 100).      . insulin glargine (LANTUS) 100 UNIT/ML injection Inject 0.3 mLs (30 Units total) into the skin 2 (two) times daily.  10 mL  12  . isosorbide mononitrate (IMDUR) 30 MG 24 hr tablet Take 1 tablet (30 mg total) by mouth daily.  30 tablet  10  . metoprolol succinate (TOPROL XL) 25 MG 24 hr tablet Take 1 tablet (25 mg total) by mouth 2 (two) times daily.  30 tablet  10  . Multiple Vitamin (MULTIVITAMIN) capsule Take 1 capsule by mouth daily.       Marland Kitchen omeprazole (PRILOSEC) 20 MG capsule Take 20 mg by mouth daily.        . potassium chloride SA (K-DUR,KLOR-CON) 20 MEQ tablet Take 20 mEq by mouth 2 (two) times daily.       Marland Kitchen warfarin (COUMADIN) 5 MG tablet Take 5 mg by mouth every other day.      Marland Kitchen acetaminophen (TYLENOL) 500 MG tablet Take 1,000 mg by mouth every 4 (four) hours as needed for pain or fever.      Marland Kitchen alum & mag hydroxide-simeth (MAALOX/MYLANTA) 200-200-20 MG/5ML suspension Take 30 mLs by mouth every 6 (six) hours as needed for indigestion.      Marland Kitchen anti-nausea (EMETROL) solution Take 30 mLs by mouth every 30 (thirty) minutes as needed for nausea (up to 4 times a day).      . cyanocobalamin (,VITAMIN B-12,) 1000 MCG/ML injection Inject 1 mL (1,000 mcg total) into the muscle once.  1 mL  20  . loperamide (IMODIUM) 2 MG capsule Take 2 mg by mouth 4 (four) times daily as needed for diarrhea or loose stools.      . magnesium hydroxide (MILK OF MAGNESIA) 400 MG/5ML suspension Take 30 mLs by mouth 2 (two) times daily as needed for constipation.      . sodium phosphate (FLEET) enema Place 1 enema rectally as needed (if constipation not relieved by milk of magnesia). follow package directions      . warfarin (COUMADIN) 7.5 MG tablet Take 7.5 mg by mouth every other  day.        Physical Exam: Blood pressure 157/90, pulse 87, temperature 97.8 F (36.6 C), temperature source Axillary, resp. rate 20, height 5\' 10"  (1.778 m), weight 204 lb 9.4 oz (92.8 kg), SpO2 95.00%.   General: Well developed, well nourished,confused complaining of pain in the right hip and right leg. Head: Eyes PERRLA, constricted, bilateral xanthomas.   Normal cephalic and  atramatic  Lungs: Clear in apex, diminished in the bases, L>R with poor inspiratory effort.  Heart: HRIR S1 S2, distant.  Pulses are 2+ & equal.            Unable to assess neck due to cervical collar..  No abdominal bruits. Abdomen: Bowel sounds are positive, abdomen soft and non-tender without masses or                  Hernia's noted. Msk:  Back normal,diminished strength and tone for age. Extremities: No clubbing, cyanosis or edema. Right foot turned outward to the right DP +1 Neuro: Alert and mild confusion but is responsive. Psych:  Good affect.    Labs:   Lab Results  Component Value Date   WBC 8.8 08/26/2012   HGB 15.3 08/26/2012   HCT 45.0 08/26/2012   MCV 81.9 08/26/2012   PLT 191 08/26/2012    Recent Labs Lab 08/26/12 0514  NA 141  K 3.6  CL 103  BUN 24*  CREATININE 1.70*  GLUCOSE 116*      BNP (last 3 results)  Recent Labs  06/22/12 1907  PROBNP 4692.0*      Radiology: Dg Chest 1 View  08/26/2012   *RADIOLOGY REPORT*  Clinical Data: Right hip fracture.  CHEST - 1 VIEW  Comparison: 08/16/2012  IMPRESSION: Cardiac enlargement with left pleural effusion and atelectasis or infiltration in the left lung base.   Original Report Authenticated By: Burman Nieves, M.D.   Dg Hip 1 View Right  08/26/2012   *RADIOLOGY REPORT*  Clinical Data: Right hip pain after fall.  RIGHT HIP - 1 VIEW  Comparison: 08/26/2012 at 0526 hours.  IMPRESSION: Comminuted intertrochanteric fracture of the right hip with varus angulation and displaced lesser trochanteric fragment.   Original Report Authenticated  By: Burman Nieves, M.D.   Dg Hip Complete Right  08/26/2012   *RADIOLOGY REPORT*  Clinical Data: Right-sided hip pain after fall.  RIGHT HIP - COMPLETE 2+ VIEW   IMPRESSION: Comminuted intertrochanteric fracture of the right hip with varus angulation and displacement of the lesser trochanteric fragment.   Original Report Authenticated By: Burman Nieves, M.D.   Ct Head Wo Contrast  08/26/2012   *RADIOLOGY REPORT*  Clinical Data: The patient tripped and fell onto the right hip. Coumadin.  CT HEAD WITHOUT CONTRAST  MPRESSION: No acute intracranial abnormalities.  Chronic atrophy and small vessel ischemic changes.   Original Report Authenticated By: Burman Nieves, M.D.   Ct Cervical Spine Wo Contrast  08/26/2012   *RADIOLOGY REPORT*  Clinical Data: The patient tripped and fell.  CT CERVICAL SPINE WITHOUT CONTRAST  MPRESSION: Old appearing ununited fracture of the base of the odontoid process of C2.  There is posterior subluxation of the odontoid process with respect to the body of C2 which is developed since the previous study but is likely chronic.  Diffuse degenerative changes.  No acute fractures are demonstrated in the cervical spine.  Large left pleural effusion versus pleural thickening noted in the apex.   Original Report Authenticated By: Burman Nieves, M.D.   Dg Chest Portable 1 View  08/26/2012   *RADIOLOGY REPORT*  Clinical Data: Hip pain after fall.  PORTABLE CHEST - 1 VIEW  Comparison: 08/26/2012 at 0531 hours  Findings: Shallow inspiration.  Cardiac enlargement with mild pulmonary vascular congestion.  Moderate to large left pleural effusion with consolidation in the left lung base.  No significant change.  IMPRESSION: Cardiac enlargement with pulmonary vascular congestion.  Left pleural  effusion and basilar consolidation or atelectasis.   Original Report Authenticated By: Burman Nieves, M.D.   ZOX:WRUEAV fib with LBBB, frequent PVC's.   ASSESSMENT AND PLAN:   1. Chronic  Systolic Dysfunction: Mild pulmonary vascular congestion with a moderate to large left pleural effusion with consolidation in the left lung base.  Pro BNP 4692.His recent echocardiogram completed July 2014 demonstrated a systolic function of 30-35%. Home doses of Lasix 80 mg daily. We will begin IV Lasix 40 mg IV twice a day, insert Foley catheter. Continue potassium replacement, Toprol 25 mg daily, and nitrates. He is not on an ACE inhibitor due to renal dysfunction. Creatinine 1.70. Recommend insertion of Foley catheter as he is unable to ambulate and has mild confusion.. Concerns for recurrent pneumonia in the setting of bed rest. Continue oxygen via nasal cannula.  2. Atrial fibrillation: Rate controlled on beta blocker only. He subtherapeutic INR. HIT positive. Now on Argatroban. Need to make a decision concerning continuing coumadin in the setting of possible orthopedic surgery of repair of right hip. Per his son, orthopedics is willing to proceed with surgery as he is subtherapeutic at this time.  3. Left pleural effusion: Last evaluation by CHF discussion for thoracentesis was documented. There were plans to have a thoracentesis completed this week. Coumadin however was not discontinued in anticipation of this. Consider having this done prior to intubation for orthopedic surgery for optimal postoperative recovery. Recommend pulmonary consultation.  4. CAD: Known history of CABG in 1999. Had a stress Myoview completed in April of 2011 with an EF of 61% with no evidence of ischemia or infarct. With decreased EF, he will be a moderate risk for surgery. He will need) operative followup.  5. Cervical Fracture: No history of same with complication of subluxation which is new finding with fall. He remains on a cervical collar. Neurology is following.  6.Acute fracture of right hip: This. has occurred secondary to fall. I have concerns about restarting coumadin therapy in this setting. CT scan of the head  was negative for intracranial bleeding or CVA.  Signed: Bettey Mare. Lyman Bishop NP [facility] 08/26/2012, 9:22 AM Co-Sign MD   Attending Note:   The patient was seen and examined.  Agree with assessment and plan as noted above.  Changes made to the above note as needed.  I have also talked to his son, [name], who works here in [phone].    Mr. Schmieder seems to be fairly well compensated from a CHF standpoint - able to lie flat without dyspnea.  His LV EF is 30-35% and is stable.  I would place him at a moderate risk for CV complications.   I agree that we should do all that we can to get his hip fixed.  Otherwise, I think his prognosis is very poor.  Will DC his coumadin. Continue the Argatroban.    I have called Dr. Marchelle Gearing from pulmonary to assess his L pleural effusion .  It may be too loculated to tap.      Vesta Mixer, Montez Hageman., MD, [facility] 08/26/2012, 12:11 PM

## 2012-08-26 NOTE — Consult Note (Signed)
Reason for Consult: Chronic C2 fracture Referring Physician: Dr. Durene Romans  Wayne Montoya is an 79 y.o. male.  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 Patient was followed by Dr. Gerlene Fee  underwent multiple and followup imaging showing nonhealing of chronic odontoid fracture. Patient was maintaining a cervical collar 100% time however was noncompliant as far as it was restricted motion he continue to move his neck around in a collar alternately decided not to wear the collar anymore understanding the risks of potential becoming quadriplegic by making a decision. Both he and his son discussed this and made the decision consciously. Patient was readmitted recently with a hip fracture and is undergoing repair earlier this week. Patient currently denies any numbness and tingling in his arms or his legs he does have neck pain with motion.  Past Medical History  Diagnosis Date  . CHF (congestive heart failure)     secondary to diastolic function. Echo 4/09: EF 55%  . CAD (coronary artery disease)     status post CABG in 1996  . Paroxysmal atrial fibrillation   . Diabetes mellitus     type was not specified  . HTN (hypertension)   . Hyperlipidemia   . Chronic renal insufficiency     with a base line creatinine of 1.6  . Cerebrovascular accident     Hx of it, Left sided facial droop  . Depression   . Carotid bruit     s/p carotids in 2006, showed mild hard plaque in the bulbs and proximal bifurcations, left greater that right. Both ICAs take steep posterior dives. Right ICA 0-39% stenosis; left ICA 40-59% stenosis.  Carrotids 9/20: 0-39%  . BPH (benign prostatic hyperplasia)     Hx of it.  . S/P cholecystectomy   . Fall     C2 fracture/ 12/15/08  . Dementia     Past Surgical History  Procedure Laterality Date  . Coronary artery bypass graft      No family history on file.  Social  History:  reports that he quit smoking about 40 years ago. He does not have any smokeless tobacco history on file. He reports that he does not drink alcohol or use illicit drugs.  Allergies:  Allergies  Allergen Reactions  . Hydralazine Hcl Other (See Comments)    Dizziness  . Heparin Other (See Comments)    HIT per [facility]  . Moxifloxacin Other (See Comments)    Per [facility]  . Penicillins Other (See Comments)    Per [facility]  [facility]    Medications: I have reviewed the patient's current medications.  Results for orders placed during the hospital encounter of 08/26/12 (from the past 48 hour(s))  ABO/RH     Status: None   Collection Time    08/26/12  5:03 AM      Result Value Range   ABO/RH(D) A POS    CBC     Status: Abnormal   Collection Time    08/26/12  5:11 AM      Result Value Range   WBC 8.8  4.0 - 10.5 K/uL   RBC 5.09  4.22 - 5.81 MIL/uL   Hemoglobin 13.7  13.0 - 17.0 g/dL   HCT 56.2  13.0 - 86.5 %   MCV 81.9  78.0 - 100.0 fL   MCH 26.9  26.0 - 34.0 pg   MCHC 32.9  30.0 - 36.0 g/dL   RDW 16.1 (*) 09.6 - 04.5 %   Platelets 191  150 - 400 K/uL  PROTIME-INR     Status: Abnormal   Collection Time    08/26/12  5:11 AM      Result Value Range   Prothrombin Time 18.5 (*) 11.6 - 15.2 seconds   INR 1.59 (*) 0.00 - 1.49  TYPE AND SCREEN     Status: None   Collection Time    08/26/12  5:12 AM      Result Value Range   ABO/RH(D) A POS     Antibody Screen NEG     Sample Expiration 08/29/2012    POCT I-STAT, CHEM 8     Status: Abnormal   Collection Time    08/26/12  5:14 AM      Result Value Range   Sodium 141  135 - 145 mEq/L   Potassium 3.6  3.5 - 5.1 mEq/L   Chloride 103  96 - 112 mEq/L   BUN 24 (*) 6 - 23 mg/dL   Creatinine, Ser 4.09 (*) 0.50 - 1.35 mg/dL   Glucose, Bld 811 (*) 70 - 99 mg/dL   Calcium, Ion 9.14  7.82 - 1.30 mmol/L   TCO2 26  0 - 100 mmol/L   Hemoglobin 15.3  13.0 - 17.0 g/dL   HCT 95.6  21.3 - 08.6 %    Dg Chest 1 View  08/26/2012   *RADIOLOGY  REPORT*  Clinical Data: Right hip fracture.  CHEST - 1 VIEW  Comparison: 08/16/2012  Findings: Backboard artifact.  Stable appearance of postoperative changes in the mediastinum.  Cardiac enlargement with pulmonary vascular congestion.  Left pleural effusion is increasing in size, possibly due to the supine technique.  Atelectasis or consolidation in the left lung base.  IMPRESSION: Cardiac enlargement with left pleural effusion and atelectasis or infiltration in the left lung base.   Original Report Authenticated By: Burman Nieves, M.D.   Dg Hip 1 View Right  08/26/2012   *RADIOLOGY REPORT*  Clinical Data: Right hip pain after fall.  RIGHT HIP - 1 VIEW  Comparison: 08/26/2012 at 0526 hours.  Findings: Comminuted intertrochanteric fracture of the right hip with varus angulation, impaction, and displacement of the lesser trochanteric fragment.  No subluxation of the hip joints.  The pelvis appears intact.  No focal bone lesion.  Vascular calcifications.  IMPRESSION: Comminuted intertrochanteric fracture of the right hip with varus angulation and displaced lesser trochanteric fragment.   Original Report Authenticated By: Burman Nieves, M.D.   Dg Hip Complete Right  08/26/2012   *RADIOLOGY REPORT*  Clinical Data: Right-sided hip pain after fall.  RIGHT HIP - COMPLETE 2+ VIEW  Comparison: None.  Findings: Backboard artifact.  There is a comminuted fracture of the intertrochanteric region of the right hip with varus angulation of the hip and impaction and displacement of fracture fragments. Visualized pelvis, SI joints, and symphysis pubis appear intact. Degenerative changes in the lower lumbar spine and hips.  Vascular calcifications.  Diffuse bone demineralization.  IMPRESSION: Comminuted intertrochanteric fracture of the right hip with varus angulation and displacement of the lesser trochanteric fragment.   Original Report Authenticated By: Burman Nieves, M.D.   Ct Head Wo Contrast  08/26/2012    *RADIOLOGY REPORT*  Clinical Data: The patient tripped and fell onto the right hip. Coumadin.  CT HEAD WITHOUT CONTRAST  Technique:  Contiguous axial images were obtained from the base of the skull through the vertex without contrast.  Comparison: 06/25/2012  Findings: Diffuse cerebral atrophy.  Low attenuation changes in the deep white matter consistent with central atrophy.  No ventricular dilatation.  Old lacunar infarct in the left basal ganglion.  No mass effect or midline shift.  No abnormal extra-axial fluid collections.  Gray-white matter junctions are distinct.  Basal cisterns are not effaced.  No evidence of acute intracranial hemorrhage.  Prominent vertebral basilar and carotid calcifications.  Small soft tissue hematoma over the right anterior frontal region.  Opacification of the left maxillary antrum is likely inflammatory.  Sinuses demonstrate clearing since previous study.  IMPRESSION: No acute intracranial abnormalities.  Chronic atrophy and small vessel ischemic changes.   Original Report Authenticated By: Burman Nieves, M.D.   Ct Cervical Spine Wo Contrast  08/26/2012   *RADIOLOGY REPORT*  Clinical Data: The patient tripped and fell.  CT CERVICAL SPINE WITHOUT CONTRAST  Technique:  Multidetector CT imaging of the cervical spine was performed. Multiplanar CT image reconstructions were also generated.  Comparison: 12/15/2008  Findings: There is a nonunited fracture of the base of the odontoid process with distraction of fracture fragments and with mild posterior subluxation of the odontoid fragment with respect to the base.  The fracture was demonstrated on the previous study but displacement and distraction was not present at that time.  The margins appear well corticated and this is likely chronic change although acute displacement is not entirely excluded.  Remainder of the cervical vertebrae and the facet joints are otherwise well- aligned.  Degenerative changes in the cervical spine with  narrowed cervical interspaces and associated endplate hypertrophic changes throughout.  Postoperative versus degenerative fusion at C5-6. Degenerative changes are progressive since the previous study.  No vertebral compression deformities.  No prevertebral soft tissue swelling.  Lateral masses of C1 appear symmetrical.  Vascular calcifications in the cervical carotid vessels.  Incidental note of large pleural effusion versus pleural thickening in the left apex and vascular prominence in the upper lungs suggesting vascular congestion.  Calcification of the aorta.  IMPRESSION: Old appearing ununited fracture of the base of the odontoid process of C2.  There is posterior subluxation of the odontoid process with respect to the body of C2 which is developed since the previous study but is likely chronic.  Diffuse degenerative changes.  No acute fractures are demonstrated in the cervical spine.  Large left pleural effusion versus pleural thickening noted in the apex.   Original Report Authenticated By: Burman Nieves, M.D.   Dg Chest Portable 1 View  08/26/2012   *RADIOLOGY REPORT*  Clinical Data: Hip pain after fall.  PORTABLE CHEST - 1 VIEW  Comparison: 08/26/2012 at 0531 hours  Findings: Shallow inspiration.  Cardiac enlargement with mild pulmonary vascular congestion.  Moderate to large left pleural effusion with consolidation in the left lung base.  No significant change.  IMPRESSION: Cardiac enlargement with pulmonary vascular congestion.  Left pleural effusion and basilar consolidation or atelectasis.   Original Report Authenticated By: Burman Nieves, M.D.    Review of Systems  Constitutional: Negative.   HENT: Positive for neck pain.   Eyes: Negative.   Cardiovascular: Negative.   Gastrointestinal: Negative.   Genitourinary: Negative.   Skin: Negative.   Neurological: Negative.   Endo/Heme/Allergies: Negative.   Psychiatric/Behavioral: Negative.    Blood pressure 157/90, pulse 87, temperature  97.8 F (36.6 C), temperature source Axillary, resp. rate 20, height 5\' 10"  (1.778 m), weight 92.8 kg (204 lb 9.4 oz), SpO2 95.00%. Physical Exam  Constitutional: He appears well-developed and  well-nourished.  HENT:  Head: Normocephalic.  Eyes: Pupils are equal, round, and reactive to light.  Neck: Normal range of motion.  Cardiovascular: Normal rate.   Respiratory: Effort normal.  GI: Soft.  Musculoskeletal: Normal range of motion.  Neurological: He is alert. He has normal strength. GCS eye subscore is 4. GCS verbal subscore is 5. GCS motor subscore is 6.  Reflex Scores:      Patellar reflexes are 0 on the right side and 0 on the left side.      Achilles reflexes are 0 on the right side and 0 on the left side. Patient is awake and alert he is fairly urine and he is demented tissue is very difficult obtained from him although it did obtain a from his son who has helped her per return easily living relative and is a nurse and 2900. He does appear to move all extremities well except for was limited in the right lower extremity with his hip fracture    Assessment/Plan: 79 year old gentleman presents for right hip ORIF with known chronic C2 fracture. I have extensively reviewed the patient's films talk with his son and top of the patient. 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 I told patient is really not a surgical candidate for a C1-2 fusion they understand that nor did they want to proceed forward with that so in the setting of having a chronic nonunion C2 in a patient with neck pain on flexion extension is presumptive that he does have some motion there. I've explained that any surgical procedure requiring intubation would put him at risk for having a spinal cord injury and ending up quadriplegic. They understand that risk and the risk of being sedentary with a hip fracture and non-operative  management is much greater in their minds then proceed forward so and I agree with proceeding forward with surgery however maintain a cervical collar,  minimize any manipulation neck during intubation recommend either glide scope or fiberoptic intubation and leave the collar on during the operation and positioning to minimize the chances of displacement and spinal cord injury. Both the patient and the patient's son understand this and understands the risk for surgery and want to proceed forward.  Bernardine Langworthy P 08/26/2012, 9:26 AM

## 2012-08-26 NOTE — H&P (Addendum)
[facility] History and Physical  Wayne Montoya ZOX:[id number] DOB: [birth year] DOA: 08/26/2012  Referring physician: er PCP: Wilmer Floor., MD  Specialists: neurosurgery Cardiology ortho  Chief Complaint: hip fracture  HPI: Wayne Montoya is a 79 y.o. male  Brought from an ALF.  Per records, he got up during the night, tripped and fell into his shower.  Patient has an extensive past medical history: A fib- on coumadin, left pleural effusion- recently tapped (5/15), old C2 fracture-2011- no surgery, EF 30-35% (follows with Dr. Jones Broom).  He was recently hospitalized in May for PNA.  Per son, he is ambulatory at baseline with walker.  He is from an ALF.    Weight now is 92.8 KG; weight at baseline is 208-210  In the ER, he was found to have a right hip fracture, a new C2 subluxation on top of C2 fracture.  He had a reoccurrence of his left pleural effusion.  No fever, no chills, he is mildly SOB, no chest pain Pain in right leg- unable to move  Review of Systems: all systems reviewed, negative unless stated above   Past Medical History  Diagnosis Date  . CHF (congestive heart failure)     secondary to diastolic function. Echo 4/09: EF 55%  . CAD (coronary artery disease)     status post CABG in 1996  . Paroxysmal atrial fibrillation   . Diabetes mellitus     type was not specified  . HTN (hypertension)   . Hyperlipidemia   . Chronic renal insufficiency     with a base line creatinine of 1.6  . Cerebrovascular accident     Hx of it, Left sided facial droop  . Depression   . Carotid bruit     s/p carotids in 2006, showed mild hard plaque in the bulbs and proximal bifurcations, left greater that right. Both ICAs take steep posterior dives. Right ICA 0-39% stenosis; left ICA 40-59% stenosis.  Carrotids 9/20: 0-39%  . BPH (benign prostatic hyperplasia)     Hx of it.  . S/P cholecystectomy   . Fall     C2 fracture/ 12/15/08  . Dementia    Past Surgical History   Procedure Laterality Date  . Coronary artery bypass graft     Social History:  reports that he quit smoking about 40 years ago. He does not have any smokeless tobacco history on file. He reports that he does not drink alcohol or use illicit drugs. Lives in ALF- ambulates at baseline  Allergies  Allergen Reactions  . Hydralazine Hcl Other (See Comments)    Dizziness  . Heparin Other (See Comments)    HIT per [facility]  . Moxifloxacin Other (See Comments)    [facility]  . Penicillins Other (See Comments)    Per [facility]    Family Hx: CAD  Prior to Admission medications   Medication Sig Start Date End Date Taking? Authorizing Provider  Artificial Tears 0.1-0.3 % SOLN Place 1 drop into both eyes 3 (three) times daily.    Yes Historical Provider, MD  atorvastatin (LIPITOR) 80 MG tablet Take 80 mg by mouth daily. 07/29/10  Yes Dolores Patty, MD  furosemide (LASIX) 80 MG tablet Take 1 tablet (80 mg total) by mouth daily. Give extra 80 mg in PM if wt 211 or greater 07/10/12  Yes Dolores Patty, MD  hydrALAZINE (APRESOLINE) 25 MG tablet Take 1 tablet (25 mg total) by mouth 3 (three) times daily.  Give at 8am, 2pm, and 8pm 07/10/12  Yes Dolores Patty, MD  insulin aspart (NOVOLOG) 100 UNIT/ML injection Inject 12 Units into the skin 3 (three) times daily with meals as needed for high blood sugar (hold if CBG is less than 100).   Yes Historical Provider, MD  insulin glargine (LANTUS) 100 UNIT/ML injection Inject 0.3 mLs (30 Units total) into the skin 2 (two) times daily. 06/28/12  Yes Richarda Overlie, MD  isosorbide mononitrate (IMDUR) 30 MG 24 hr tablet Take 1 tablet (30 mg total) by mouth daily. 06/28/12  Yes Richarda Overlie, MD  metoprolol succinate (TOPROL XL) 25 MG 24 hr tablet Take 1 tablet (25 mg total) by mouth 2 (two) times daily. 07/10/12  Yes Dolores Patty, MD  Multiple Vitamin (MULTIVITAMIN) capsule Take 1 capsule by mouth daily.    Yes Historical Provider, MD  omeprazole  (PRILOSEC) 20 MG capsule Take 20 mg by mouth daily.     Yes Historical Provider, MD  potassium chloride SA (K-DUR,KLOR-CON) 20 MEQ tablet Take 20 mEq by mouth 2 (two) times daily.    Yes Historical Provider, MD  warfarin (COUMADIN) 5 MG tablet Take 5 mg by mouth every other day.   Yes Historical Provider, MD  acetaminophen (TYLENOL) 500 MG tablet Take 1,000 mg by mouth every 4 (four) hours as needed for pain or fever.    Historical Provider, MD  alum & mag hydroxide-simeth (MAALOX/MYLANTA) 200-200-20 MG/5ML suspension Take 30 mLs by mouth every 6 (six) hours as needed for indigestion.    Historical Provider, MD  anti-nausea (EMETROL) solution Take 30 mLs by mouth every 30 (thirty) minutes as needed for nausea (up to 4 times a day).    Historical Provider, MD  cyanocobalamin (,VITAMIN B-12,) 1000 MCG/ML injection Inject 1 mL (1,000 mcg total) into the muscle once. 06/28/12   Richarda Overlie, MD  loperamide (IMODIUM) 2 MG capsule Take 2 mg by mouth 4 (four) times daily as needed for diarrhea or loose stools.    Historical Provider, MD  magnesium hydroxide (MILK OF MAGNESIA) 400 MG/5ML suspension Take 30 mLs by mouth 2 (two) times daily as needed for constipation.    Historical Provider, MD  sodium phosphate (FLEET) enema Place 1 enema rectally as needed (if constipation not relieved by milk of magnesia). follow package directions    Historical Provider, MD  warfarin (COUMADIN) 7.5 MG tablet Take 7.5 mg by mouth every other day.    Historical Provider, MD   Physical Exam: Filed Vitals:   08/26/12 0645 08/26/12 0700 08/26/12 0715 08/26/12 0730  BP: 222/102 211/122 217/99 202/99  Pulse: 96 97 97 94  Temp:      TempSrc:      Resp: 19 20 18 20   SpO2: 97% 97% 96% 96%     General:  Cooperative, elderly, in c spine  Eyes: wnl  ENT: wnl  Neck: in c2 collar  Cardiovascular: irregular  Respiratory: decreased breath sounds, no wheezing  Abdomen: +Bs, soft  Musculoskeletal: painful right  hip  Neurologic: right arm weaker than left, unable to move right leg without pain  Labs on Admission:  Basic Metabolic Panel:  Recent Labs Lab 08/26/12 0514  NA 141  K 3.6  CL 103  GLUCOSE 116*  BUN 24*  CREATININE 1.70*   Liver Function Tests: No results found for this basename: AST, ALT, ALKPHOS, BILITOT, PROT, ALBUMIN,  in the last 168 hours No results found for this basename: LIPASE, AMYLASE,  in the last 168  hours No results found for this basename: AMMONIA,  in the last 168 hours CBC:  Recent Labs Lab 08/26/12 0511 08/26/12 0514  WBC 8.8  --   HGB 13.7 15.3  HCT 41.7 45.0  MCV 81.9  --   PLT 191  --    Cardiac Enzymes: No results found for this basename: CKTOTAL, CKMB, CKMBINDEX, TROPONINI,  in the last 168 hours  BNP (last 3 results)  Recent Labs  06/22/12 1907  PROBNP 4692.0*   CBG: No results found for this basename: GLUCAP,  in the last 168 hours  Radiological Exams on Admission: Dg Chest 1 View  08/26/2012   *RADIOLOGY REPORT*  Clinical Data: Right hip fracture.  CHEST - 1 VIEW  Comparison: 08/16/2012  Findings: Backboard artifact.  Stable appearance of postoperative changes in the mediastinum.  Cardiac enlargement with pulmonary vascular congestion.  Left pleural effusion is increasing in size, possibly due to the supine technique.  Atelectasis or consolidation in the left lung base.  IMPRESSION: Cardiac enlargement with left pleural effusion and atelectasis or infiltration in the left lung base.   Original Report Authenticated By: Burman Nieves, M.D.   Dg Hip 1 View Right  08/26/2012   *RADIOLOGY REPORT*  Clinical Data: Right hip pain after fall.  RIGHT HIP - 1 VIEW  Comparison: 08/26/2012 at 0526 hours.  Findings: Comminuted intertrochanteric fracture of the right hip with varus angulation, impaction, and displacement of the lesser trochanteric fragment.  No subluxation of the hip joints.  The pelvis appears intact.  No focal bone lesion.  Vascular  calcifications.  IMPRESSION: Comminuted intertrochanteric fracture of the right hip with varus angulation and displaced lesser trochanteric fragment.   Original Report Authenticated By: Burman Nieves, M.D.   Dg Hip Complete Right  08/26/2012   *RADIOLOGY REPORT*  Clinical Data: Right-sided hip pain after fall.  RIGHT HIP - COMPLETE 2+ VIEW  Comparison: None.  Findings: Backboard artifact.  There is a comminuted fracture of the intertrochanteric region of the right hip with varus angulation of the hip and impaction and displacement of fracture fragments. Visualized pelvis, SI joints, and symphysis pubis appear intact. Degenerative changes in the lower lumbar spine and hips.  Vascular calcifications.  Diffuse bone demineralization.  IMPRESSION: Comminuted intertrochanteric fracture of the right hip with varus angulation and displacement of the lesser trochanteric fragment.   Original Report Authenticated By: Burman Nieves, M.D.   Ct Head Wo Contrast  08/26/2012   *RADIOLOGY REPORT*  Clinical Data: The patient tripped and fell onto the right hip. Coumadin.  CT HEAD WITHOUT CONTRAST  Technique:  Contiguous axial images were obtained from the base of the skull through the vertex without contrast.  Comparison: 06/25/2012  Findings: Diffuse cerebral atrophy.  Low attenuation changes in the deep white matter consistent with central atrophy.  No ventricular dilatation.  Old lacunar infarct in the left basal ganglion.  No mass effect or midline shift.  No abnormal extra-axial fluid collections.  Gray-white matter junctions are distinct.  Basal cisterns are not effaced.  No evidence of acute intracranial hemorrhage.  Prominent vertebral basilar and carotid calcifications.  Small soft tissue hematoma over the right anterior frontal region.  Opacification of the left maxillary antrum is likely inflammatory.  Sinuses demonstrate clearing since previous study.  IMPRESSION: No acute intracranial abnormalities.  Chronic  atrophy and small vessel ischemic changes.   Original Report Authenticated By: Burman Nieves, M.D.   Ct Cervical Spine Wo Contrast  08/26/2012   *RADIOLOGY REPORT*  Clinical  Data: The patient tripped and fell.  CT CERVICAL SPINE WITHOUT CONTRAST  Technique:  Multidetector CT imaging of the cervical spine was performed. Multiplanar CT image reconstructions were also generated.  Comparison: 12/15/2008  Findings: There is a nonunited fracture of the base of the odontoid process with distraction of fracture fragments and with mild posterior subluxation of the odontoid fragment with respect to the base.  The fracture was demonstrated on the previous study but displacement and distraction was not present at that time.  The margins appear well corticated and this is likely chronic change although acute displacement is not entirely excluded.  Remainder of the cervical vertebrae and the facet joints are otherwise well- aligned.  Degenerative changes in the cervical spine with narrowed cervical interspaces and associated endplate hypertrophic changes throughout.  Postoperative versus degenerative fusion at C5-6. Degenerative changes are progressive since the previous study.  No vertebral compression deformities.  No prevertebral soft tissue swelling.  Lateral masses of C1 appear symmetrical.  Vascular calcifications in the cervical carotid vessels.  Incidental note of large pleural effusion versus pleural thickening in the left apex and vascular prominence in the upper lungs suggesting vascular congestion.  Calcification of the aorta.  IMPRESSION: Old appearing ununited fracture of the base of the odontoid process of C2.  There is posterior subluxation of the odontoid process with respect to the body of C2 which is developed since the previous study but is likely chronic.  Diffuse degenerative changes.  No acute fractures are demonstrated in the cervical spine.  Large left pleural effusion versus pleural thickening noted  in the apex.   Original Report Authenticated By: Burman Nieves, M.D.   Dg Chest Portable 1 View  08/26/2012   *RADIOLOGY REPORT*  Clinical Data: Hip pain after fall.  PORTABLE CHEST - 1 VIEW  Comparison: 08/26/2012 at 0531 hours  Findings: Shallow inspiration.  Cardiac enlargement with mild pulmonary vascular congestion.  Moderate to large left pleural effusion with consolidation in the left lung base.  No significant change.  IMPRESSION: Cardiac enlargement with pulmonary vascular congestion.  Left pleural effusion and basilar consolidation or atelectasis.   Original Report Authenticated By: Burman Nieves, M.D.    EKG: Independently reviewed atrial fib  Assessment/Plan Active Problems:   DIABETES MELLITUS, TYPE II   CAD, ARTERY BYPASS GRAFT   Pleural effusion, left   Atrial fibrillation   C2 cervical fracture   Intertrochanteric fracture of right hip   1. Right intertrochanteric fracture- Dr. Charlann Boxer consulted- will hold on surgery until patient medically maximized: Cards consult, NS consult for neck fracture (please page Dr. Charlann Boxer when patient ready for surgery to arrange OR time: [phone]) 2. DM- no longer NPO today, SSI, will give lantus dose as well x 1- hold when NPO 3. A fib: rate controlled, on coumadin (hold)- allergic to heparin- argatroban per pharmacy unless cardiology does not think he needs to be bridged 4. C2 cervical fracture- Neurosurgery consulted- fracture is old (2011)- wore collar then but not recently- did not opt for surgery, now in C collar 5. Left pleural effusion- recent tap; defer to cardiology if this needs to be done again before surgery- resume lasix- is on 2L O2 which is new 6. EF 30-35%- consult cardiology for help with risk stratification- follows with Dr. Jones Broom, check BNP 7. AKI on CKD?- baseline from 1.49-2.19, hold IVF for now as patient eating- resume lasix 8. HTN- BP initally was 200 systolically- was going to start nitro gtt but BP improved on own-  not NPO, so will give home BP medciations and monitor  Ortho Cardiology NS  Code Status: DNR Family Communication: patient/son at bedside Disposition Plan: to SDU  Time spent: 70 min  Benjamine Mola JESSICA [facility] Pager [phone]  If 7PM-7AM, please contact night-coverage [email or web address] Password [facility] 08/26/2012, 8:31 AM

## 2012-08-26 NOTE — Significant Event (Addendum)
PT  Transferred  from  ED C- Collar in place to neck . Nerology into see pt . Dr Zenaida Niece requesting transfer to 2900. 2900 nurse called, pt transferred to 2925 son with pt.

## 2012-08-26 NOTE — ED Notes (Signed)
C-collar remains in place, offered aspen collar for comfort. Pt son declined at this time, stated pt had an aspen collar at home which he would adjust to allow full mvmt of neck. "It wasn't doing anything for him at all with the way he adjusted it". Stated will wait for admitting/Ortho MD

## 2012-08-26 NOTE — Progress Notes (Addendum)
ANTICOAGULATION CONSULT NOTE - Initial Consult  Pharmacy Consult for Argatroban Indication: atrial fibrillation  Allergies  Allergen Reactions  . Hydralazine Hcl Other (See Comments)    Dizziness  . Heparin Other (See Comments)    HIT per [facility]  . Moxifloxacin Other (See Comments)    Per [facility]  . Penicillins Other (See Comments)    Per [facility]    Patient Measurements: Height: 5\' 10"  (177.8 cm) Weight: 204 lb 9.4 oz (92.8 kg) (bed scale) IBW/kg (Calculated) : 73   Vital Signs: Temp: 97.8 F (36.6 C) (07/13 0800) Temp src: Axillary (07/13 0800) BP: 189/92 mmHg (07/13 0959) Pulse Rate: 89 (07/13 0959)  Labs:  Recent Labs  08/26/12 0511 08/26/12 0514  HGB 13.7 15.3  HCT 41.7 45.0  PLT 191  --   LABPROT 18.5*  --   INR 1.59*  --   CREATININE  --  1.70*    Estimated Creatinine Clearance: 34.4 ml/min (by C-G formula based on Cr of 1.7).   Medical History: Past Medical History  Diagnosis Date  . CHF (congestive heart failure)     secondary to diastolic function. Echo 4/09: EF 55%  . CAD (coronary artery disease)     status post CABG in 1996  . Paroxysmal atrial fibrillation   . Diabetes mellitus     type was not specified  . HTN (hypertension)   . Hyperlipidemia   . Chronic renal insufficiency     with a base line creatinine of 1.6  . Cerebrovascular accident     Hx of it, Left sided facial droop  . Depression   . Carotid bruit     s/p carotids in 2006, showed mild hard plaque in the bulbs and proximal bifurcations, left greater that right. Both ICAs take steep posterior dives. Right ICA 0-39% stenosis; left ICA 40-59% stenosis.  Carrotids 9/20: 0-39%  . BPH (benign prostatic hyperplasia)     Hx of it.  . S/P cholecystectomy   . Fall     C2 fracture/ 12/15/08  . Dementia     Assessment: 66 YOM admitted with a hip fracture with history of AFib on warfarin at home- home dose 7.5mg  every other day alternating with 5mg  (confirmed with assisted living  facility- does not have a set weekly schedule, just alternating days). Has a history of HIT and per Dr. Benjamine Mola, wants to bridge him until surgery with Argatroban. Baseline INR is SUBtherapeutic at 1.59, CBC WNL  Goal of Therapy:  aPTT 50-90 seconds Monitor platelets by anticoagulation protocol: Yes   Plan:  1. Start Argatroban at 2mcg/kg/min- 84mcg/min 2. Check aPTT in 2 hours 3. Daily aPTTs when in range 4. Daily CBC 5. Follow up surgical plans, and transition back to oral anticoagulants  Shirlean Berman D. Mihaela Fajardo, PharmD Clinical Pharmacist Pager: [phone] 08/26/2012 10:39 AM\   ADDENDUM Initial aPTT checked 2 hours after argatroban drip started was therapeutic at 70 seconds. No bleeding noted  Plan: - continue argatroban at 86mcg/min - confirm aPTT in 2 hours - can go to daily aPTTs if next level is still therapeutic.

## 2012-08-26 NOTE — Consult Note (Signed)
PULMONARY  / CRITICAL CARE MEDICINE  Name: Wayne Montoya MRN: [id number] DOB: December 04, 1922    ADMISSION DATE:  08/26/2012 CONSULTATION DATE:  7/13   REFERRING MD :  [facility]  PRIMARY SERVICE:  [facility]   CHIEF COMPLAINT:  Left Pleural effusion -recurrent   BRIEF PATIENT DESCRIPTION:  79yo male SNF resident with mult medical problems including CHF, AFib on coumadin, chronic renal insufficiency, hx CVA, chronic C2 fx  admitted by [facility] 7/13 for R. Hip Fx after fall . Found to have recurrent Left Pleural effusion .  PCCM asked for consult for preop clearance /pl effusion .   SIGNIFICANT EVENTS / STUDIES:  CT C Spine >Old appearing ununited fracture of the base of the odontoid process  of C2. There is posterior subluxation of the odontoid process with respect to the body of C2  7/13 CT chest >  LINES / TUBES:   CULTURES:   ANTIBIOTICS:   HISTORY OF PRESENT ILLNESS:   Wayne Montoya is an 79 year old male patient to the ER from any assisted living facility, after a fall, tripping last evening and falling into his shower sustaining a comminuted intertrochanteric right hip fracture, and a new C2 subluxation on top of a C2 fracture. Marland Kitchen His son Zyon Rosser, RN) who is an ICU RN provides most of the history.  He has a complicated medical history to include atrial fibrillation on Coumadin, CAD, status post CABG in 1999, chronic diastolic CHF, with newly diagnosed systolic dysfunction with recent echo completed in July of 2014 demonstrating EF of 30-35% followed by Dr. Arvilla Meres, participate in cardioMEMS pulmonary artery sensory device trial, HIT, hypertension, history of previous CVA, peptic ulcer disease, chronic left effusion.  He was last seen by Dr. [name] on 08/16/2012. Recommended a thoracentesis in the setting of his large pleural effusion. This has not yet been completed. Chest x-ray completed this a.m. demonstrated moderate to large left pleural effusion with consolidation in the left lung  base. He has been seen by cardiology . Also seen by NS who is followed by Dr. Gerlene Fee underwent multiple  imaging showing nonhealing of chronic C2  fracture.   Patient currently denies any numbness and tingling in his arms or his legs he does have neck pain with motion.  He Shari Heritage are aware of risks of C2 fracture with surgery and neck manipulation.  He is in a C -collar.  He has a hx of remote smoking, quit 50 yr . No fever, cough, congestion or orthopnea. Chronic leg swelling less than usual.  No fever and wbc is nml.  Was admitted 06/2011 with HCAP , left effusion and CHF . Tx w/ ABX , throacentesis w/ neg cytology and diuresis. Improved on post thora films.  On follow up at CHF clinic on 7/3, left effusion returned.    PAST MEDICAL HISTORY :  Past Medical History  Diagnosis Date  . CHF (congestive heart failure)     secondary to diastolic function. Echo 4/09: EF 55%  . CAD (coronary artery disease)     status post CABG in 1996  . Paroxysmal atrial fibrillation   . Diabetes mellitus     type was not specified  . HTN (hypertension)   . Hyperlipidemia   . Chronic renal insufficiency     with a base line creatinine of 1.6  . Cerebrovascular accident     Hx of it, Left sided facial droop  . Depression   . Carotid bruit     s/p  carotids in 2006, showed mild hard plaque in the bulbs and proximal bifurcations, left greater that right. Both ICAs take steep posterior dives. Right ICA 0-39% stenosis; left ICA 40-59% stenosis.  Carrotids 9/20: 0-39%  . BPH (benign prostatic hyperplasia)     Hx of it.  . S/P cholecystectomy   . Fall     C2 fracture/ 12/15/08  . Dementia    Past Surgical History  Procedure Laterality Date  . Coronary artery bypass graft     Prior to Admission medications   Medication Sig Start Date End Date Taking? Authorizing Provider  Artificial Tears 0.1-0.3 % SOLN Place 1 drop into both eyes 3 (three) times daily.    Yes Historical Provider, MD  atorvastatin  (LIPITOR) 80 MG tablet Take 80 mg by mouth daily. 07/29/10  Yes Dolores Patty, MD  furosemide (LASIX) 80 MG tablet Take 1 tablet (80 mg total) by mouth daily. Give extra 80 mg in PM if wt 211 or greater 07/10/12  Yes Dolores Patty, MD  hydrALAZINE (APRESOLINE) 25 MG tablet Take 1 tablet (25 mg total) by mouth 3 (three) times daily. Give at 8am, 2pm, and 8pm 07/10/12  Yes Dolores Patty, MD  insulin aspart (NOVOLOG) 100 UNIT/ML injection Inject 12 Units into the skin 3 (three) times daily with meals as needed for high blood sugar (hold if CBG is less than 100).   Yes Historical Provider, MD  insulin glargine (LANTUS) 100 UNIT/ML injection Inject 0.3 mLs (30 Units total) into the skin 2 (two) times daily. 06/28/12  Yes Richarda Overlie, MD  isosorbide mononitrate (IMDUR) 30 MG 24 hr tablet Take 1 tablet (30 mg total) by mouth daily. 06/28/12  Yes Richarda Overlie, MD  metoprolol succinate (TOPROL XL) 25 MG 24 hr tablet Take 1 tablet (25 mg total) by mouth 2 (two) times daily. 07/10/12  Yes Dolores Patty, MD  Multiple Vitamin (MULTIVITAMIN) capsule Take 1 capsule by mouth daily.    Yes Historical Provider, MD  omeprazole (PRILOSEC) 20 MG capsule Take 20 mg by mouth daily.     Yes Historical Provider, MD  potassium chloride SA (K-DUR,KLOR-CON) 20 MEQ tablet Take 20 mEq by mouth 2 (two) times daily.    Yes Historical Provider, MD  warfarin (COUMADIN) 5 MG tablet Take 5 mg by mouth every other day.   Yes Historical Provider, MD  acetaminophen (TYLENOL) 500 MG tablet Take 1,000 mg by mouth every 4 (four) hours as needed for pain or fever.    Historical Provider, MD  alum & mag hydroxide-simeth (MAALOX/MYLANTA) 200-200-20 MG/5ML suspension Take 30 mLs by mouth every 6 (six) hours as needed for indigestion.    Historical Provider, MD  anti-nausea (EMETROL) solution Take 30 mLs by mouth every 30 (thirty) minutes as needed for nausea (up to 4 times a day).    Historical Provider, MD  cyanocobalamin (,VITAMIN  B-12,) 1000 MCG/ML injection Inject 1 mL (1,000 mcg total) into the muscle once. 06/28/12   Richarda Overlie, MD  loperamide (IMODIUM) 2 MG capsule Take 2 mg by mouth 4 (four) times daily as needed for diarrhea or loose stools.    Historical Provider, MD  magnesium hydroxide (MILK OF MAGNESIA) 400 MG/5ML suspension Take 30 mLs by mouth 2 (two) times daily as needed for constipation.    Historical Provider, MD  sodium phosphate (FLEET) enema Place 1 enema rectally as needed (if constipation not relieved by milk of magnesia). follow package directions    Historical Provider, MD  warfarin (COUMADIN) 7.5 MG tablet Take 7.5 mg by mouth every other day.    Historical Provider, MD   Allergies  Allergen Reactions  . Hydralazine Hcl Other (See Comments)    Dizziness  . Heparin Other (See Comments)    HIT per [facility]  . Moxifloxacin Other (See Comments)    Per [facility]  . Penicillins Other (See Comments)    Per [facility]  [facility]    FAMILY HISTORY:  No family history on file. SOCIAL HISTORY:  reports that he quit smoking about 40 years ago. He does not have any smokeless tobacco history on file. He reports that he does not drink alcohol or use illicit drugs.   REVIEW OF SYSTEMS:   Constitutional: Negative for fever, chills, weight loss, malaise/fatigue and diaphoresis.  HENT: Negative for  , ear pain, nosebleeds, congestion, sore throat,  tinnitus and ear discharge.   +neck pain and HOH  Eyes: Negative for blurred vision, double vision, photophobia, pain, discharge and redness.  Respiratory: Negative for cough, hemoptysis, sputum production, shortness of breath, wheezing and stridor.   Cardiovascular: Negative for chest pain, palpitations, orthopnea, claudication, Chronic  leg swelling no change .  Gastrointestinal: Negative for heartburn, nausea, vomiting, abdominal pain, diarrhea, constipation, blood in stool and melena.  Genitourinary: Negative for dysuria, urgency, frequency, hematuria and flank pain.   Musculoskeletal: Negative for myalgias,  ++hip pain on right , ++falls.  Skin: Negative for itching and rash.  Neurological: Negative for dizziness, tingling, tremors, sensory change, speech change, focal weakness, seizures, loss of consciousness, + weakness  Endo/Heme/Allergies: Negative for environmental allergies and polydipsia. Does not bruise/bleed easily.  SUBJECTIVE:  Lying in bed in C collar  +pain in hip  No dyspnea or cough , no fever   VITAL SIGNS: Temp:  [97.8 F (36.6 C)-98.1 F (36.7 C)] 98.1 F (36.7 C) (07/13 1213) Pulse Rate:  [87-97] 89 (07/13 0959) Resp:  [17-24] 20 (07/13 1100) BP: (149-227)/(66-122) 149/66 mmHg (07/13 1213) SpO2:  [92 %-98 %] 97 % (07/13 1213) Weight:  [204 lb 9.4 oz (92.8 kg)] 204 lb 9.4 oz (92.8 kg) (07/13 0800)  PHYSICAL EXAMINATION: General:  Elderly WM , chronically ill appearing , NAD  Neuro:  Alert, HOH, follows commands , nml grips, moves arms , wiggles toes HEENT:  Dry mucosa , PERRL ,  Neck:  No JVD, C collar  Cardiovascular: irreg/irreg , no m/r/g  Lungs:  diminshed BS in bases, no wheezing *(STAFF NOTE: ? Diaphragmatic paralysis, paradoxical respiration) Abdomen:  Soft , nt , BS + Musculoskeletal:  C collar, +right hip pain,  Skin: intact    Recent Labs Lab 08/26/12 0514  NA 141  K 3.6  CL 103  BUN 24*  CREATININE 1.70*  GLUCOSE 116*    Recent Labs Lab 08/26/12 0511 08/26/12 0514  HGB 13.7 15.3  HCT 41.7 45.0  WBC 8.8  --   PLT 191  --    Dg Chest 1 View  08/26/2012   *RADIOLOGY REPORT*  Clinical Data: Right hip fracture.  CHEST - 1 VIEW  Comparison: 08/16/2012  Findings: Backboard artifact.  Stable appearance of postoperative changes in the mediastinum.  Cardiac enlargement with pulmonary vascular congestion.  Left pleural effusion is increasing in size, possibly due to the supine technique.  Atelectasis or consolidation in the left lung base.  IMPRESSION: Cardiac enlargement with left pleural effusion and  atelectasis or infiltration in the left lung base.   Original Report Authenticated By: Burman Nieves, M.D.  Dg Hip 1 View Right  08/26/2012   *RADIOLOGY REPORT*  Clinical Data: Right hip pain after fall.  RIGHT HIP - 1 VIEW  Comparison: 08/26/2012 at 0526 hours.  Findings: Comminuted intertrochanteric fracture of the right hip with varus angulation, impaction, and displacement of the lesser trochanteric fragment.  No subluxation of the hip joints.  The pelvis appears intact.  No focal bone lesion.  Vascular calcifications.  IMPRESSION: Comminuted intertrochanteric fracture of the right hip with varus angulation and displaced lesser trochanteric fragment.   Original Report Authenticated By: Burman Nieves, M.D.   Dg Hip Complete Right  08/26/2012   *RADIOLOGY REPORT*  Clinical Data: Right-sided hip pain after fall.  RIGHT HIP - COMPLETE 2+ VIEW  Comparison: None.  Findings: Backboard artifact.  There is a comminuted fracture of the intertrochanteric region of the right hip with varus angulation of the hip and impaction and displacement of fracture fragments. Visualized pelvis, SI joints, and symphysis pubis appear intact. Degenerative changes in the lower lumbar spine and hips.  Vascular calcifications.  Diffuse bone demineralization.  IMPRESSION: Comminuted intertrochanteric fracture of the right hip with varus angulation and displacement of the lesser trochanteric fragment.   Original Report Authenticated By: Burman Nieves, M.D.   Ct Head Wo Contrast  08/26/2012   *RADIOLOGY REPORT*  Clinical Data: The patient tripped and fell onto the right hip. Coumadin.  CT HEAD WITHOUT CONTRAST  Technique:  Contiguous axial images were obtained from the base of the skull through the vertex without contrast.  Comparison: 06/25/2012  Findings: Diffuse cerebral atrophy.  Low attenuation changes in the deep white matter consistent with central atrophy.  No ventricular dilatation.  Old lacunar infarct in the left  basal ganglion.  No mass effect or midline shift.  No abnormal extra-axial fluid collections.  Gray-white matter junctions are distinct.  Basal cisterns are not effaced.  No evidence of acute intracranial hemorrhage.  Prominent vertebral basilar and carotid calcifications.  Small soft tissue hematoma over the right anterior frontal region.  Opacification of the left maxillary antrum is likely inflammatory.  Sinuses demonstrate clearing since previous study.  IMPRESSION: No acute intracranial abnormalities.  Chronic atrophy and small vessel ischemic changes.   Original Report Authenticated By: Burman Nieves, M.D.   Ct Cervical Spine Wo Contrast  08/26/2012   *RADIOLOGY REPORT*  Clinical Data: The patient tripped and fell.  CT CERVICAL SPINE WITHOUT CONTRAST  Technique:  Multidetector CT imaging of the cervical spine was performed. Multiplanar CT image reconstructions were also generated.  Comparison: 12/15/2008  Findings: There is a nonunited fracture of the base of the odontoid process with distraction of fracture fragments and with mild posterior subluxation of the odontoid fragment with respect to the base.  The fracture was demonstrated on the previous study but displacement and distraction was not present at that time.  The margins appear well corticated and this is likely chronic change although acute displacement is not entirely excluded.  Remainder of the cervical vertebrae and the facet joints are otherwise well- aligned.  Degenerative changes in the cervical spine with narrowed cervical interspaces and associated endplate hypertrophic changes throughout.  Postoperative versus degenerative fusion at C5-6. Degenerative changes are progressive since the previous study.  No vertebral compression deformities.  No prevertebral soft tissue swelling.  Lateral masses of C1 appear symmetrical.  Vascular calcifications in the cervical carotid vessels.  Incidental note of large pleural effusion versus pleural  thickening in the left apex and vascular prominence in the upper lungs suggesting  vascular congestion.  Calcification of the aorta.  IMPRESSION: Old appearing ununited fracture of the base of the odontoid process of C2.  There is posterior subluxation of the odontoid process with respect to the body of C2 which is developed since the previous study but is likely chronic.  Diffuse degenerative changes.  No acute fractures are demonstrated in the cervical spine.  Large left pleural effusion versus pleural thickening noted in the apex.   Original Report Authenticated By: Burman Nieves, M.D.   Dg Chest Portable 1 View  08/26/2012   *RADIOLOGY REPORT*  Clinical Data: Hip pain after fall.  PORTABLE CHEST - 1 VIEW  Comparison: 08/26/2012 at 0531 hours  Findings: Shallow inspiration.  Cardiac enlargement with mild pulmonary vascular congestion.  Moderate to large left pleural effusion with consolidation in the left lung base.  No significant change.  IMPRESSION: Cardiac enlargement with pulmonary vascular congestion.  Left pleural effusion and basilar consolidation or atelectasis.   Original Report Authenticated By: Burman Nieves, M.D.    ASSESSMENT / PLAN: 1. Recurrent Left Pleural Effusion ? Etiology  Prev admit 06/2012 with Left effusion s/p thoracentesis (950cc) felt possible parapneumonic +/- CHF (cytology neg ) . Re-occured on CXR 7/3 .  >  Plan  Check CT chest  Check LA/ PCT  Tr wbc/temp  Consider ABX after CT chest  Will decide on throacentesis after CT check   2. R. Hip Fracture  Plan  Per Ortho -surgery on hold until more stable   3.  Chronic C2 Fracture with new C2 subluxation - STAFF NOTE: Appears to have paradoxical breathing ? Diaphragmatic paralysis Seen by NS consult . Pt to cont with C Collar .   4. CHF /Atrial Fib /CAD s/p CABG /HTN  Prior to admit on coumadin   Recent Labs Lab 08/26/12 1004  PROBNP 3413.0*    Recent Labs Lab 08/26/12 0511  INR 1.59*    recent  echocardiogram completed July 2014 demonstrated a systolic function of 30-35%  Plan  Cardiology following  Cont Diuresis  CT chest pending , consider IR tap if needed  Tr BNP  Keep Neg to even bal  Argatroban per cards (Hx of HIT )   5. DM  Per Primary team   6. Acute on Chronic Renal insufficiency (baseline 1.4 7/3)   Recent Labs Lab 08/26/12 0514  NA 141  K 3.6  CL 103  GLUCOSE 116*  BUN 24*  CREATININE 1.70*     Plan  Avoid nephrotoxins    6. Dementia Ottis Stain  Per Primary team     Plan  Check CT chest    Pulmonary and Critical Care Medicine [facility] Pager: [phone]  08/26/2012, 3:03 PM   STAFF NOTE:   Preoperative risk pulmonary risk assessment   [place] Postperative Pulmonary Risk Score Patient comment score  Type of surgery - abd ao aneurysm (27), thoracic (21), neurosurgery / upper abdominal / vascular (21), neck (11) Right hip surgery 0  Emergency Surgery - (11) yes 11  ALbumin < 3 or poor nutritional state - (9) 3.0 - may 2014 0  BUN > 30 -  (8) 24 - July 2014 0  Partial or completely dependent functional status - (7) Uses walker, partially dependent 7  COPD -  (6) No copd, no o2 use prior to admit but has  ? diaphragmatic weakness due to old c2 fracture and hypoxemia 6  Age - 44 to 4 (4), > 70  (6) Age 27 6  TOTAL  24  Risk Stratifcation scores  - < 10, 11-19, 20-27, 28-40, >40  Moderate risk for pulmonary complications     CANET Postperative Pulmonary Risk Score Patient comment score  Age - <50 (0), 50-80 (3), >80 (16) Age 24 16  Preoperative pulse ox - >96 (0), 91-95 (8), <90 (24) 2L o2 need due to left pleural effusion. On RA he is 88% 24   Respiratory infection in last month - Yes (17) Yes, pneumonia but 2 months ago in may 2014 0  Preoperative anemia - < 10gm% - Yes (11) hgb 15.3gm% 0  Surgical incision - Upper abdominal (15), Thoracic (24) Hip incision 0  Duration of surgery - <2h (0), 2-3h (16), >3h (23) Less than 2h  for hip repair per son who got info from otho 0   Emergency Surgery - Yes (8) yes 8  TOTAL    Risk Stratification - Low (<26), Intermediate (26-44), High (>45)  48 and considered moderate-high risk If not hypoxemic after thora, then risk could be alleviated to low-moderate potentially   Pulmonary consultation for preoperative pulmonary risk assessment prior to hip surgery especially in the context of recurrent left-sided effusion. This left-sided effusion first appeared early in May 2014. Thoracentesis Jun 28 2012 revealed this to be a transudate. On chest x-ray 08/16/2012 this appears to have recurred and again confirmed on CT scan of the chest 08/26/2012. It appears free-flowing. Question process if doing thoracentesis would alleviate pulmonary risk following hip surgery.  2 issues  1. C2 fracture that appears old and on clinical exam there might be associated diaphragmatic paralysis. In any event the presence of C2 fracture poses an intubation risk for transection of the spinal cord.  This is an issue for anesthesia.   2. second issue is the risk of respiratory failure, prolonged ventilator dependence, chronic critical illness and ventilator associated pneumonia or hospital acquired pneumonia complicating stay following hip surgery. The risks of this as seen above on a scoring systems is largely determined by age, hypoxemia, nutritional status, renal status, functional status, pulmonary issues and location of the incision, duration of surgery and emergency nature of the surgery. His risk for postoperative pulmonary complications based on 2 different scoring systems is at least moderate based on the fact he is 89, hypoxemic, partially dependent also status, has some renal insufficiency and emergent nature of the surgery., If he indeed has diaphragmatic weakness then he recently worried about prolonged ventilator dependence.  If the surgery is greater than 2 hours then risk is high. Doing a thoracentesis  and alleviating hypoxemia could potentially reduce the risk but only to a certain level  3. due to potential for risk reduction pulmonary consultation for surgery I will order a thoracentesis with analysis with IR   D/w son Edwen Mclester in detail over phone     Dr. Kalman Shan, M.D., [facility].C.P Pulmonary and Critical Care Medicine Staff Physician [facility] [place] [facility] and [facility] Pager: [phone], If no answer or between  15:00h - 7:00h: call [phone]  08/26/2012 6:30 PM

## 2012-08-26 NOTE — ED Provider Notes (Signed)
History    CSN: [id number] Arrival date & time 08/26/12  0450  First MD Initiated Contact with Patient 08/26/12 [phone]     Chief Complaint  Patient presents with  . Fall  . Hip Pain   (Consider location/radiation/quality/duration/timing/severity/associated sxs/prior Treatment) HPI History provided by patient and his son bedside. Resides at assisted living facility was found down after a fall. Patient is complaining of right hip pain with obvious deformity. He is on Coumadin for history of A. Fib. Patient is DO NOT RESUSCITATE with history of CHF followed by heart failure clinic. He denies any weakness or numbness. He denies any chest pain or difficulty breathing. He has a known left-sided pleural effusion, recently tapped. He also has a known C-spine fracture that was not repaired. Pain is sharp in quality and moderate to severe, unable to move his right leg due to pain.  Past Medical History  Diagnosis Date  . CHF (congestive heart failure)     secondary to diastolic function. Echo 4/09: EF 55%  . CAD (coronary artery disease)     status post CABG in 1996  . Paroxysmal atrial fibrillation   . Diabetes mellitus     type was not specified  . HTN (hypertension)   . Hyperlipidemia   . Chronic renal insufficiency     with a base line creatinine of 1.6  . Cerebrovascular accident     Hx of it, Left sided facial droop  . Depression   . Carotid bruit     s/p carotids in 2006, showed mild hard plaque in the bulbs and proximal bifurcations, left greater that right. Both ICAs take steep posterior dives. Right ICA 0-39% stenosis; left ICA 40-59% stenosis.  Carrotids 9/20: 0-39%  . BPH (benign prostatic hyperplasia)     Hx of it.  . S/P cholecystectomy   . Fall     C2 fracture/ 12/15/08  . Dementia    Past Surgical History  Procedure Laterality Date  . Coronary artery bypass graft     No family history on file. History  Substance Use Topics  . Smoking status: Former Smoker    Quit  date: 06/22/1972  . Smokeless tobacco: Not on file  . Alcohol Use: No    Review of Systems  Constitutional: Negative for fever and chills.  HENT: Negative for neck pain and neck stiffness.   Eyes: Negative for visual disturbance.  Respiratory: Negative for shortness of breath.   Cardiovascular: Negative for chest pain.  Gastrointestinal: Negative for abdominal pain.  Genitourinary: Negative for flank pain.  Musculoskeletal: Negative for back pain.  Skin: Negative for rash.  Neurological: Negative for headaches.  All other systems reviewed and are negative.    Allergies  Hydralazine hcl; Heparin; Moxifloxacin; and Penicillins  Home Medications   Current Outpatient Rx  Name  Route  Sig  Dispense  Refill  . Artificial Tears 0.1-0.3 % SOLN   Both Eyes   Place 1 drop into both eyes 3 (three) times daily.          Marland Kitchen atorvastatin (LIPITOR) 80 MG tablet   Oral   Take 80 mg by mouth daily.         . furosemide (LASIX) 80 MG tablet   Oral   Take 1 tablet (80 mg total) by mouth daily. Give extra 80 mg in PM if wt 211 or greater         . hydrALAZINE (APRESOLINE) 25 MG tablet   Oral   Take 1  tablet (25 mg total) by mouth 3 (three) times daily. Give at 8am, 2pm, and 8pm   90 tablet   10   . insulin aspart (NOVOLOG) 100 UNIT/ML injection   Subcutaneous   Inject 12 Units into the skin 3 (three) times daily with meals as needed for high blood sugar (hold if CBG is less than 100).         . insulin glargine (LANTUS) 100 UNIT/ML injection   Subcutaneous   Inject 0.3 mLs (30 Units total) into the skin 2 (two) times daily.   10 mL   12   . isosorbide mononitrate (IMDUR) 30 MG 24 hr tablet   Oral   Take 1 tablet (30 mg total) by mouth daily.   30 tablet   10   . metoprolol succinate (TOPROL XL) 25 MG 24 hr tablet   Oral   Take 1 tablet (25 mg total) by mouth 2 (two) times daily.   30 tablet   10   . Multiple Vitamin (MULTIVITAMIN) capsule   Oral   Take 1  capsule by mouth daily.          Marland Kitchen omeprazole (PRILOSEC) 20 MG capsule   Oral   Take 20 mg by mouth daily.           . potassium chloride SA (K-DUR,KLOR-CON) 20 MEQ tablet   Oral   Take 20 mEq by mouth 2 (two) times daily.          Marland Kitchen warfarin (COUMADIN) 5 MG tablet   Oral   Take 5 mg by mouth every other day.         Marland Kitchen acetaminophen (TYLENOL) 500 MG tablet   Oral   Take 1,000 mg by mouth every 4 (four) hours as needed for pain or fever.         Marland Kitchen alum & mag hydroxide-simeth (MAALOX/MYLANTA) 200-200-20 MG/5ML suspension   Oral   Take 30 mLs by mouth every 6 (six) hours as needed for indigestion.         Marland Kitchen anti-nausea (EMETROL) solution   Oral   Take 30 mLs by mouth every 30 (thirty) minutes as needed for nausea (up to 4 times a day).         . cyanocobalamin (,VITAMIN B-12,) 1000 MCG/ML injection   Intramuscular   Inject 1 mL (1,000 mcg total) into the muscle once.   1 mL   20     Weekly for 4 weeks, then once a month   . loperamide (IMODIUM) 2 MG capsule   Oral   Take 2 mg by mouth 4 (four) times daily as needed for diarrhea or loose stools.         . magnesium hydroxide (MILK OF MAGNESIA) 400 MG/5ML suspension   Oral   Take 30 mLs by mouth 2 (two) times daily as needed for constipation.         . sodium phosphate (FLEET) enema   Rectal   Place 1 enema rectally as needed (if constipation not relieved by milk of magnesia). follow package directions         . warfarin (COUMADIN) 7.5 MG tablet   Oral   Take 7.5 mg by mouth every other day.          BP 169/122  Pulse 91  Temp(Src) 97.9 F (36.6 C) (Oral)  SpO2 92% Physical Exam  Constitutional: He is oriented to person, place, and time. He appears well-developed and well-nourished.  HENT:  Head:  Normocephalic and atraumatic.  Eyes: EOM are normal. Pupils are equal, round, and reactive to light.  Neck:  C. collar in place with no midline tenderness or deformity  Cardiovascular: Normal  rate, regular rhythm and intact distal pulses.   Pulmonary/Chest:  Mild tachypnea with decreased breath sounds on the left anteriorly  Abdominal: Soft. He exhibits no distension. There is no tenderness.  Musculoskeletal: He exhibits no edema.  Right lower extremity deformity shortening and rotation, pelvis stable. Tender over greater trochanter. Distal neurovascular intact x4  Neurological: He is alert and oriented to person, place, and time.  Skin: Skin is warm and dry.    ED Course  Procedures (including critical care time) Labs Reviewed  CBC - Abnormal; Notable for the following:    RDW 16.3 (*)    All other components within normal limits  PROTIME-INR - Abnormal; Notable for the following:    Prothrombin Time 18.5 (*)    INR 1.59 (*)    All other components within normal limits  POCT I-STAT, CHEM 8 - Abnormal; Notable for the following:    BUN 24 (*)    Creatinine, Ser 1.70 (*)    Glucose, Bld 116 (*)    All other components within normal limits  TYPE AND SCREEN  ABO/RH   Dg Chest 1 View  08/26/2012   *RADIOLOGY REPORT*  Clinical Data: Right hip fracture.  CHEST - 1 VIEW  Comparison: 08/16/2012  Findings: Backboard artifact.  Stable appearance of postoperative changes in the mediastinum.  Cardiac enlargement with pulmonary vascular congestion.  Left pleural effusion is increasing in size, possibly due to the supine technique.  Atelectasis or consolidation in the left lung base.  IMPRESSION: Cardiac enlargement with left pleural effusion and atelectasis or infiltration in the left lung base.   Original Report Authenticated By: Burman Nieves, M.D.   Dg Hip Complete Right  08/26/2012   *RADIOLOGY REPORT*  Clinical Data: Right-sided hip pain after fall.  RIGHT HIP - COMPLETE 2+ VIEW  Comparison: None.  Findings: Backboard artifact.  There is a comminuted fracture of the intertrochanteric region of the right hip with varus angulation of the hip and impaction and displacement of  fracture fragments. Visualized pelvis, SI joints, and symphysis pubis appear intact. Degenerative changes in the lower lumbar spine and hips.  Vascular calcifications.  Diffuse bone demineralization.  IMPRESSION: Comminuted intertrochanteric fracture of the right hip with varus angulation and displacement of the lesser trochanteric fragment.   Original Report Authenticated By: Burman Nieves, M.D.   Ct Head Wo Contrast  08/26/2012   *RADIOLOGY REPORT*  Clinical Data: The patient tripped and fell onto the right hip. Coumadin.  CT HEAD WITHOUT CONTRAST  Technique:  Contiguous axial images were obtained from the base of the skull through the vertex without contrast.  Comparison: 06/25/2012  Findings: Diffuse cerebral atrophy.  Low attenuation changes in the deep white matter consistent with central atrophy.  No ventricular dilatation.  Old lacunar infarct in the left basal ganglion.  No mass effect or midline shift.  No abnormal extra-axial fluid collections.  Gray-white matter junctions are distinct.  Basal cisterns are not effaced.  No evidence of acute intracranial hemorrhage.  Prominent vertebral basilar and carotid calcifications.  Small soft tissue hematoma over the right anterior frontal region.  Opacification of the left maxillary antrum is likely inflammatory.  Sinuses demonstrate clearing since previous study.  IMPRESSION: No acute intracranial abnormalities.  Chronic atrophy and small vessel ischemic changes.   Original Report Authenticated By:  Burman Nieves, M.D.   Ct Cervical Spine Wo Contrast  08/26/2012   *RADIOLOGY REPORT*  Clinical Data: The patient tripped and fell.  CT CERVICAL SPINE WITHOUT CONTRAST  Technique:  Multidetector CT imaging of the cervical spine was performed. Multiplanar CT image reconstructions were also generated.  Comparison: 12/15/2008  Findings: There is a nonunited fracture of the base of the odontoid process with distraction of fracture fragments and with mild posterior  subluxation of the odontoid fragment with respect to the base.  The fracture was demonstrated on the previous study but displacement and distraction was not present at that time.  The margins appear well corticated and this is likely chronic change although acute displacement is not entirely excluded.  Remainder of the cervical vertebrae and the facet joints are otherwise well- aligned.  Degenerative changes in the cervical spine with narrowed cervical interspaces and associated endplate hypertrophic changes throughout.  Postoperative versus degenerative fusion at C5-6. Degenerative changes are progressive since the previous study.  No vertebral compression deformities.  No prevertebral soft tissue swelling.  Lateral masses of C1 appear symmetrical.  Vascular calcifications in the cervical carotid vessels.  Incidental note of large pleural effusion versus pleural thickening in the left apex and vascular prominence in the upper lungs suggesting vascular congestion.  Calcification of the aorta.  IMPRESSION: Old appearing ununited fracture of the base of the odontoid process of C2.  There is posterior subluxation of the odontoid process with respect to the body of C2 which is developed since the previous study but is likely chronic.  Diffuse degenerative changes.  No acute fractures are demonstrated in the cervical spine.  Large left pleural effusion versus pleural thickening noted in the apex.   Original Report Authenticated By: Burman Nieves, M.D.    Date: 08/26/2012  Rate: 88  Rhythm: atrial fibrillation  QRS Axis: left  Intervals: normal  ST/T Wave abnormalities: nonspecific ST/T changes  Conduction Disutrbances:nonspecific intraventricular conduction delay  Narrative Interpretation:   Old EKG Reviewed: unchanged  IV fentanyl pain control  6:23 AM d/w Ortho DR Supple, will follow  0630 discussed with Dr. Betti Cruz, hospitalist - plan admit medicine  MDM  Fall with right hip fracture  EKG, labs,  imaging  IV narcotics pain control  Orthopedic consultation and medical admission  Sunnie Nielsen, MD 08/26/12 [phone]

## 2012-08-26 NOTE — Progress Notes (Signed)
ANTICOAGULATION CONSULT NOTE - f/u Consult  Pharmacy Consult for Argatroban Indication: atrial fibrillation  Allergies  Allergen Reactions  . Hydralazine Hcl Other (See Comments)    Dizziness  . Heparin Other (See Comments)    HIT per [facility]  . Moxifloxacin Other (See Comments)    Per [facility]  . Penicillins Other (See Comments)    Per [facility]  [facility]    Patient Measurements: Height: 5\' 10"  (177.8 cm) Weight: 204 lb 9.4 oz (92.8 kg) (bed scale) IBW/kg (Calculated) : 73   Vital Signs: Temp: 97.7 F (36.5 C) (07/13 1600) Temp src: Oral (07/13 1600) BP: 142/80 mmHg (07/13 1400) Pulse Rate: 83 (07/13 1400)  Labs:  Recent Labs  08/26/12 0511 08/26/12 0514 08/26/12 1012 08/26/12 1423 08/26/12 1729  HGB 13.7 15.3  --   --   --   HCT 41.7 45.0  --   --   --   PLT 191  --   --   --   --   APTT  --   --  35 70* 88*  LABPROT 18.5*  --   --   --   --   INR 1.59*  --   --   --   --   CREATININE  --  1.70*  --   --   --     Estimated Creatinine Clearance: 34.4 ml/min (by C-G formula based on Cr of 1.7).   Medical History: Past Medical History  Diagnosis Date  . CHF (congestive heart failure)     secondary to diastolic function. Echo 4/09: EF 55%  . CAD (coronary artery disease)     status post CABG in 1996  . Paroxysmal atrial fibrillation   . Diabetes mellitus     type was not specified  . HTN (hypertension)   . Hyperlipidemia   . Chronic renal insufficiency     with a base line creatinine of 1.6  . Cerebrovascular accident     Hx of it, Left sided facial droop  . Depression   . Carotid bruit     s/p carotids in 2006, showed mild hard plaque in the bulbs and proximal bifurcations, left greater that right. Both ICAs take steep posterior dives. Right ICA 0-39% stenosis; left ICA 40-59% stenosis.  Carrotids 9/20: 0-39%  . BPH (benign prostatic hyperplasia)     Hx of it.  . S/P cholecystectomy   . Fall     C2 fracture/ 12/15/08  . Dementia     Assessment: 25 YOM  admitted with a hip fracture with history of AFib on warfarin at home- home dose 7.5mg  every other day alternating with 5mg  (confirmed with assisted living facility- does not have a set weekly schedule, just alternating days). Has a history of HIT and per Dr. Benjamine Mola, wants to bridge him until surgery with Argatroban. Baseline INR is SUBtherapeutic at 1.59, CBC WNL --aPTT now in goal range x 2, currently 88.  Goal of Therapy:  aPTT 50-90 seconds Monitor platelets by anticoagulation protocol: Yes   Plan:  - continue argatroban at 71mcg/min - next aPTT due in am    Lamari Youngers S. Merilynn Finland, PharmD, [facility] Clinical Staff Pharmacist Pager [phone]  08/26/2012 6:08 PM

## 2012-08-26 NOTE — ED Notes (Signed)
Pt states he got tripped up at home and fell into his shower on his right hip.

## 2012-08-27 ENCOUNTER — Inpatient Hospital Stay (HOSPITAL_COMMUNITY): Payer: Medicare Other

## 2012-08-27 DIAGNOSIS — J9601 Acute respiratory failure with hypoxia: Secondary | ICD-10-CM | POA: Diagnosis present

## 2012-08-27 DIAGNOSIS — I517 Cardiomegaly: Secondary | ICD-10-CM | POA: Diagnosis present

## 2012-08-27 DIAGNOSIS — D7582 Heparin induced thrombocytopenia (HIT): Secondary | ICD-10-CM | POA: Diagnosis present

## 2012-08-27 LAB — CBC
Platelets: 174 10*3/uL (ref 150–400)
RBC: 4.7 MIL/uL (ref 4.22–5.81)
RDW: 16.3 % — ABNORMAL HIGH (ref 11.5–15.5)
WBC: 11.6 10*3/uL — ABNORMAL HIGH (ref 4.0–10.5)

## 2012-08-27 LAB — GLUCOSE, CAPILLARY
Glucose-Capillary: 134 mg/dL — ABNORMAL HIGH (ref 70–99)
Glucose-Capillary: 140 mg/dL — ABNORMAL HIGH (ref 70–99)
Glucose-Capillary: 159 mg/dL — ABNORMAL HIGH (ref 70–99)

## 2012-08-27 LAB — BASIC METABOLIC PANEL
CO2: 25 mEq/L (ref 19–32)
Calcium: 9.6 mg/dL (ref 8.4–10.5)
GFR calc Af Amer: 32 mL/min — ABNORMAL LOW (ref >90)
GFR calc non Af Amer: 27 mL/min — ABNORMAL LOW (ref >90)
Sodium: 135 mEq/L (ref 135–145)

## 2012-08-27 LAB — LACTATE DEHYDROGENASE: LDH: 241 U/L (ref 94–250)

## 2012-08-27 LAB — PROTEIN, TOTAL: Total Protein: 6.7 g/dL (ref 6.0–8.3)

## 2012-08-27 LAB — ALBUMIN: Albumin: 3 g/dL — ABNORMAL LOW (ref 3.5–5.2)

## 2012-08-27 MED ORDER — OXYCODONE HCL 5 MG PO TABS: 5.0000 mg | ORAL_TABLET | ORAL | Status: DC | PRN

## 2012-08-27 MED ORDER — ARGATROBAN 50 MG/50ML IV SOLN
46.0000 ug/min | INTRAVENOUS | Status: DC
  Filled 2012-08-27: qty 50

## 2012-08-27 MED ORDER — ARGATROBAN 50 MG/50ML IV SOLN
66.0000 ug/min | INTRAVENOUS | Status: DC
  Administered 2012-08-27: 66 ug/min via INTRAVENOUS
  Filled 2012-08-27 (×2): qty 50

## 2012-08-27 MED ORDER — INSULIN ASPART 100 UNIT/ML ~~LOC~~ SOLN
0.0000 [IU] | SUBCUTANEOUS | Status: DC
  Administered 2012-08-27: 4 [IU] via SUBCUTANEOUS
  Administered 2012-08-28 (×2): 3 [IU] via SUBCUTANEOUS
  Administered 2012-08-29 (×5): 4 [IU] via SUBCUTANEOUS
  Administered 2012-08-30 (×2): 3 [IU] via SUBCUTANEOUS
  Administered 2012-08-30 (×3): 4 [IU] via SUBCUTANEOUS
  Administered 2012-08-30: 3 [IU] via SUBCUTANEOUS
  Administered 2012-08-31: 4 [IU] via SUBCUTANEOUS
  Administered 2012-08-31: 3 [IU] via SUBCUTANEOUS
  Administered 2012-08-31: 7 [IU] via SUBCUTANEOUS
  Administered 2012-08-31: 4 [IU] via SUBCUTANEOUS
  Administered 2012-08-31 – 2012-09-01 (×2): 3 [IU] via SUBCUTANEOUS
  Administered 2012-09-01: 4 [IU] via SUBCUTANEOUS
  Administered 2012-09-01: 7 [IU] via SUBCUTANEOUS
  Administered 2012-09-01: 4 [IU] via SUBCUTANEOUS

## 2012-08-27 MED ORDER — ACETAMINOPHEN 325 MG PO TABS: 650.0000 mg | ORAL_TABLET | Freq: Four times a day (QID) | ORAL | Status: DC | PRN

## 2012-08-27 MED ORDER — MORPHINE SULFATE 2 MG/ML IJ SOLN
2.0000 mg | INTRAMUSCULAR | Status: DC | PRN
  Administered 2012-08-27 – 2012-08-29 (×5): 2 mg via INTRAVENOUS
  Filled 2012-08-27: qty 1
  Filled 2012-08-27: qty 2
  Filled 2012-08-27 (×4): qty 1

## 2012-08-27 MED ORDER — LORAZEPAM 2 MG/ML IJ SOLN
0.5000 mg | INTRAMUSCULAR | Status: DC | PRN
  Administered 2012-08-28: 1 mg via INTRAVENOUS
  Administered 2012-08-29: 0.5 mg via INTRAVENOUS
  Filled 2012-08-27 (×2): qty 1

## 2012-08-27 MED ORDER — INSULIN GLARGINE 100 UNIT/ML ~~LOC~~ SOLN: 10.0000 [IU] | Freq: Every day | SUBCUTANEOUS | Status: DC

## 2012-08-27 NOTE — Clinical Social Work Placement (Signed)
     Clinical Social Work Department CLINICAL SOCIAL WORK PLACEMENT NOTE 08/27/2012  Patient:  Wayne Montoya, Wayne Montoya  Account Number:  [id number] Admit date:  08/26/2012  Clinical Social Worker:  Rozetta Nunnery, Theresia Majors  Date/time:  08/27/2012 02:09 PM  Clinical Social Work is seeking post-discharge placement for this patient at the following level of care:   SKILLED NURSING   (*CSW will update this form in Epic as items are completed)   08/27/2012  Patient/family provided with [facility] Department of Clinical Social Works list of facilities offering this level of care within the geographic area requested by the patient (or if unable, by the patients family).  08/27/2012  Patient/family informed of their freedom to choose among providers that offer the needed level of care, that participate in Medicare, Medicaid or managed care program needed by the patient, have an available bed and are willing to accept the patient.  08/27/2012  Patient/family informed of [facility] ownership interest in [facility], as well as of the fact that they are under no obligation to receive care at this facility.  PASARR submitted to EDS on 08/27/2012 PASARR number received from EDS on 08/27/2012  FL2 transmitted to all facilities in geographic area requested by pt/family on  08/27/2012 FL2 transmitted to all facilities within larger geographic area on   Patient informed that his/her managed care company has contracts with or will negotiate with  certain facilities, including the following:     Patient/family informed of bed offers received:   Patient chooses bed at  Physician recommends and patient chooses bed at    Patient to be transferred to  on   Patient to be transferred to facility by   The following physician request were entered in Epic:   Additional Comments:

## 2012-08-27 NOTE — Progress Notes (Signed)
SLP Cancellation Note  Patient Details Name: RESHAUN BRISENO MRN: [id number] DOB: May 26, 1922   Cancelled treatment:       Reason Eval/Treat Not Completed: Patient at procedure or test/unavailable. Pt not in room. Will attempt tomorrow.   Harlon Ditty, [place] CCC-SLP [phone]  Claudine Mouton 08/27/2012, 3:31 PM

## 2012-08-27 NOTE — Consult Note (Signed)
Reason for Consult: Right hip fracture Referring Physician:  [facility] via ED admission  Wayne Montoya is an 79 y.o. male.  HPI: 79yo male with multiple medical co morbidities with mechanical fall, immediate pain, inability to bear weight and transfer to ER for evaluation.  Son HCPOA at bedside  Past Medical History  Diagnosis Date  . CHF (congestive heart failure)     secondary to diastolic function. Echo 4/09: EF 55%  . CAD (coronary artery disease)     status post CABG in 1996  . Paroxysmal atrial fibrillation   . Diabetes mellitus     type was not specified  . HTN (hypertension)   . Hyperlipidemia   . Chronic renal insufficiency     with a base line creatinine of 1.6  . Cerebrovascular accident     Hx of it, Left sided facial droop  . Depression   . Carotid bruit     s/p carotids in 2006, showed mild hard plaque in the bulbs and proximal bifurcations, left greater that right. Both ICAs take steep posterior dives. Right ICA 0-39% stenosis; left ICA 40-59% stenosis.  Carrotids 9/20: 0-39%  . BPH (benign prostatic hyperplasia)     Hx of it.  . S/P cholecystectomy   . Fall     C2 fracture/ 12/15/08  . Dementia     Past Surgical History  Procedure Laterality Date  . Coronary artery bypass graft      No family history on file.  Social History:  reports that he quit smoking about 40 years ago. He does not have any smokeless tobacco history on file. He reports that he does not drink alcohol or use illicit drugs.  Allergies:  Allergies  Allergen Reactions  . Hydralazine Hcl Other (See Comments)    Dizziness  . Heparin Other (See Comments)    HIT per [facility] MAR  . Moxifloxacin Other (See Comments)    Per [facility]  . Penicillins Other (See Comments)    Per [facility]  MAR    Medications:  I have reviewed the patient's current medications. Scheduled: . atorvastatin  80 mg Oral Daily  . furosemide  40 mg Intravenous BID  . hydrALAZINE  25 mg Oral TID  . insulin aspart  0-15 Units  Subcutaneous Q4H  . isosorbide mononitrate  30 mg Oral Daily  . metoprolol succinate  25 mg Oral BID  . pantoprazole  40 mg Oral Daily  . potassium chloride SA  20 mEq Oral BID    Results for orders placed during the hospital encounter of 08/26/12 (from the past 24 hour(s))  GLUCOSE, CAPILLARY     Status: Abnormal   Collection Time    08/26/12  9:39 AM      Result Value Range   Glucose-Capillary 133 (*) 70 - 99 mg/dL  MRSA PCR SCREENING     Status: None   Collection Time    08/26/12  9:49 AM      Result Value Range   MRSA by PCR NEGATIVE  NEGATIVE  PRO B NATRIURETIC PEPTIDE     Status: Abnormal   Collection Time    08/26/12 10:04 AM      Result Value Range   Pro B Natriuretic peptide (BNP) 3413.0 (*) 0 - 450 pg/mL  APTT     Status: None   Collection Time    08/26/12 10:12 AM      Result Value Range   aPTT 35  24 - 37 seconds  GLUCOSE, CAPILLARY  Status: Abnormal   Collection Time    08/26/12 12:46 PM      Result Value Range   Glucose-Capillary 116 (*) 70 - 99 mg/dL  APTT     Status: Abnormal   Collection Time    08/26/12  2:23 PM      Result Value Range   aPTT 70 (*) 24 - 37 seconds  GLUCOSE, CAPILLARY     Status: Abnormal   Collection Time    08/26/12  5:23 PM      Result Value Range   Glucose-Capillary 130 (*) 70 - 99 mg/dL  LACTIC ACID, PLASMA     Status: None   Collection Time    08/26/12  5:28 PM      Result Value Range   Lactic Acid, Venous 1.1  0.5 - 2.2 mmol/L  APTT     Status: Abnormal   Collection Time    08/26/12  5:29 PM      Result Value Range   aPTT 88 (*) 24 - 37 seconds  COMPREHENSIVE METABOLIC PANEL     Status: Abnormal   Collection Time    08/26/12  5:29 PM      Result Value Range   Sodium 137  135 - 145 mEq/L   Potassium 3.8  3.5 - 5.1 mEq/L   Chloride 102  96 - 112 mEq/L   CO2 26  19 - 32 mEq/L   Glucose, Bld 141 (*) 70 - 99 mg/dL   BUN 25 (*) 6 - 23 mg/dL   Creatinine, Ser 1.61 (*) 0.50 - 1.35 mg/dL   Calcium 8.9  8.4 - 09.6  mg/dL   Total Protein 6.3  6.0 - 8.3 g/dL   Albumin 2.8 (*) 3.5 - 5.2 g/dL   AST 15  0 - 37 U/L   ALT 9  0 - 53 U/L   Alkaline Phosphatase 63  39 - 117 U/L   Total Bilirubin 0.8  0.3 - 1.2 mg/dL   GFR calc non Af Amer 34 (*) >90 mL/min   GFR calc Af Amer 40 (*) >90 mL/min  PROCALCITONIN     Status: None   Collection Time    08/26/12  5:29 PM      Result Value Range   Procalcitonin <0.10    GLUCOSE, CAPILLARY     Status: Abnormal   Collection Time    08/26/12  8:37 PM      Result Value Range   Glucose-Capillary 119 (*) 70 - 99 mg/dL  GLUCOSE, CAPILLARY     Status: Abnormal   Collection Time    08/27/12 12:06 AM      Result Value Range   Glucose-Capillary 120 (*) 70 - 99 mg/dL  GLUCOSE, CAPILLARY     Status: Abnormal   Collection Time    08/27/12  4:16 AM      Result Value Range   Glucose-Capillary 134 (*) 70 - 99 mg/dL  BASIC METABOLIC PANEL     Status: Abnormal   Collection Time    08/27/12  5:00 AM      Result Value Range   Sodium 135  135 - 145 mEq/L   Potassium 3.9  3.5 - 5.1 mEq/L   Chloride 99  96 - 112 mEq/L   CO2 25  19 - 32 mEq/L   Glucose, Bld 150 (*) 70 - 99 mg/dL   BUN 31 (*) 6 - 23 mg/dL   Creatinine, Ser 0.45 (*) 0.50 - 1.35 mg/dL   Calcium  9.6  8.4 - 10.5 mg/dL   GFR calc non Af Amer 27 (*) >90 mL/min   GFR calc Af Amer 32 (*) >90 mL/min  CBC     Status: Abnormal   Collection Time    08/27/12  5:00 AM      Result Value Range   WBC 11.6 (*) 4.0 - 10.5 K/uL   RBC 4.70  4.22 - 5.81 MIL/uL   Hemoglobin 12.3 (*) 13.0 - 17.0 g/dL   HCT 21.3 (*) 08.6 - 57.8 %   MCV 82.3  78.0 - 100.0 fL   MCH 26.2  26.0 - 34.0 pg   MCHC 31.8  30.0 - 36.0 g/dL   RDW 46.9 (*) 62.9 - 52.8 %   Platelets 174  150 - 400 K/uL  LACTATE DEHYDROGENASE     Status: None   Collection Time    08/27/12  5:00 AM      Result Value Range   LDH 241  94 - 250 U/L  PROTEIN, TOTAL     Status: None   Collection Time    08/27/12  5:00 AM      Result Value Range   Total Protein 6.7  6.0  - 8.3 g/dL  ALBUMIN     Status: Abnormal   Collection Time    08/27/12  5:00 AM      Result Value Range   Albumin 3.0 (*) 3.5 - 5.2 g/dL     X-ray:  RIGHT HIP - COMPLETE 2+ VIEW   Comparison: None.  Findings: Backboard artifact. There is a comminuted fracture of  the intertrochanteric region of the right hip with varus angulation  of the hip and impaction and displacement of fracture fragments.  Visualized pelvis, SI joints, and symphysis pubis appear intact.  Degenerative changes in the lower lumbar spine and hips. Vascular  calcifications. Diffuse bone demineralization.   IMPRESSION:  Comminuted intertrochanteric fracture of the right hip with varus  angulation and displacement of the lesser trochanteric fragment.   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 rate 32, height 5\' 10"  (1.778 m), weight 92.8 kg (204 lb 9.4 oz), SpO2 98.00%.  Patient seen and examined Sunday, 7/13 with son at bedside Ortho exam limited base on fracture pattern Shortened and externally rotated right lower extremity NVI  Medical examine performed just prior and reviewed  Assessment/Plan: Right comminuted intertrochanteric femur fracture  After long discussion with his son the plan for now is to gather information about medical condition and risks.  Was scheduled to have repeat thoracentesis this week due to recurrent pleural effusion  Will have TRH consult with [facility] to provide recs and guidelines regarding his C-2 fracture for  anesthetic concerns Cardiology and pulmonary to be consulted as well  Once medical issues reviewed would recommend proceeding with ORIF of right hip for pain control and mobility concerns Will wait to hear from primary team and will check chart for clearance  Keyler Hoge D 08/27/2012, 7:09 AM

## 2012-08-27 NOTE — Care Management Note (Signed)
    Page 1 of 1   08/27/2012     9:48:12 AM   CARE MANAGEMENT NOTE 08/27/2012  Patient:  Wayne Montoya, Wayne Montoya   Account Number:  [id number]  Date Initiated:  08/27/2012  Documentation initiated by:  Wayne Montoya  Subjective/Objective Assessment:   adm w hip fx. pl effusion     Action/Plan:   lives at [facility], son Wayne is rn here on 2900. pcp dr Wayne Montoya   Anticipated DC Date:     Anticipated DC Plan:  SKILLED NURSING FACILITY  In-house referral  Clinical Social Worker      DC Planning Services  CM consult      Choice offered to / List presented to:             Status of service:   Medicare Important Message given?   (If response is "NO", the following Medicare IM given date fields will be blank) Date Medicare IM given:   Date Additional Medicare IM given:    Discharge Disposition:    Per UR Regulation:  Reviewed for med. necessity/level of care/duration of stay  If discussed at Long Length of Stay Meetings, dates discussed:    Comments:  7/14 70 Wayne Albana Saperstein rn,bsn spoke w son Wayne Montoya. he's rn here on 2900. he states his dad will need snf at disch. sw ref has been made.

## 2012-08-27 NOTE — Progress Notes (Addendum)
Patient ID: Wayne Montoya, male   DOB: 03/30/1922, 79 y.o.   MRN: [id number]   Pt was in [place] for left pleural effusion thoracentesis Cannot tolerate rolling onto rt hip fracture; Cannot move left shoulder -extreme pain- crying out Cannot sit up at all.  Called Dr Sharon Seller Did not perform procedure Consider under anesthesia?  Dr Sharon Seller is agreeable

## 2012-08-27 NOTE — Progress Notes (Signed)
INITIAL NUTRITION ASSESSMENT  DOCUMENTATION CODES Per approved criteria  -Not Applicable   INTERVENTION: 1.  Modify diet; resume once medically appropriate.  Recommend Regular goal. 2.  Supplements; Ensure Complete po TID, each supplement provides 350 kcal and 13 grams of protein.  NUTRITION DIAGNOSIS: Inadequate oral intake related to omission of energy dense foods as evidenced by pt NPO x3 days.   Monitor:  1.  Food/Beverage; resume of diet with pt meeting >/=90% estimated needs with tolerance. 2.  Wt/wt change; monitor trends   Reason for Assessment: consult  79 y.o. male  Admitting Dx: Fall with hip fx  ASSESSMENT: Pt admitted with hip fx s/p fall.  Son requested nutrition consult due to pt being NPO since Saturday with upcoming surgery plans.  Son reports pt was eating well and per his usual prior to fall.  Pt lives in assisted living and receives 3 meals/day.  Son believes pt eat well.  Also reports wt stability.  Son is also concerned about swallowing ability if pt remains in C-collar.  Pt required collar ~4-5 years ago and was not able chew very well.  RD states plans to monitor for diet advancement and to start supplement regimen quickly once appropriate to assist pt in meeting needs.  Height: Ht Readings from Last 1 Encounters:  08/26/12 5\' 10"  (1.778 m)    Weight: Wt Readings from Last 1 Encounters:  08/26/12 204 lb 9.4 oz (92.8 kg)    Ideal Body Weight: 75.4 kg  % Ideal Body Weight: 122%  Wt Readings from Last 10 Encounters:  08/26/12 204 lb 9.4 oz (92.8 kg)  08/16/12 209 lb 6.4 oz (94.983 kg)  07/10/12 211 lb 12.8 oz (96.072 kg)  06/29/12 202 lb 6.1 oz (91.8 kg)  12/20/10 221 lb (100.245 kg)  11/29/10 219 lb (99.338 kg)  07/29/10 210 lb 1.9 oz (95.31 kg)  08/11/09 202 lb (91.627 kg)  05/21/09 198 lb (89.812 kg)  05/14/09 205 lb (92.987 kg)    Usual Body Weight: 200-210 lbs  % Usual Body Weight: 100%  BMI:  Body mass index is 29.36  kg/(m^2).  Estimated Nutritional Needs: Kcal: 2020-2300 Protein: 90-115g Fluid: ~2.0 L/day  Skin: intact  Diet Order: NPO  EDUCATION NEEDS: -Education needs addressed   Intake/Output Summary (Last 24 hours) at 08/27/12 1550 Last data filed at 08/27/12 1400  Gross per 24 hour  Intake 386.47 ml  Output    925 ml  Net -538.53 ml    Last BM: PTA  Labs:   Recent Labs Lab 08/26/12 0514 08/26/12 1729 08/27/12 0500  NA 141 137 135  K 3.6 3.8 3.9  CL 103 102 99  CO2  --  26 25  BUN 24* 25* 31*  CREATININE 1.70* 1.69* 2.05*  CALCIUM  --  8.9 9.6  GLUCOSE 116* 141* 150*    CBG (last 3)   Recent Labs  08/27/12 0416 08/27/12 0753 08/27/12 1225  GLUCAP 134* 140* 129*    Scheduled Meds: . atorvastatin  80 mg Oral Daily  . furosemide  40 mg Intravenous BID  . hydrALAZINE  25 mg Oral TID  . insulin aspart  0-15 Units Subcutaneous Q4H  . isosorbide mononitrate  30 mg Oral Daily  . metoprolol succinate  25 mg Oral BID  . pantoprazole  40 mg Oral Daily  . potassium chloride SA  20 mEq Oral BID    Continuous Infusions: . sodium chloride 10 mL/hr (08/27/12 1346)  . argatroban 66 mcg/min (08/27/12 1111)  Past Medical History  Diagnosis Date  . CHF (congestive heart failure)     secondary to diastolic function. Echo 4/09: EF 55%  . CAD (coronary artery disease)     status post CABG in 1996  . Paroxysmal atrial fibrillation   . Diabetes mellitus     type was not specified  . HTN (hypertension)   . Hyperlipidemia   . Chronic renal insufficiency     with a base line creatinine of 1.6  . Cerebrovascular accident     Hx of it, Left sided facial droop  . Depression   . Carotid bruit     s/p carotids in 2006, showed mild hard plaque in the bulbs and proximal bifurcations, left greater that right. Both ICAs take steep posterior dives. Right ICA 0-39% stenosis; left ICA 40-59% stenosis.  Carrotids 9/20: 0-39%  . BPH (benign prostatic hyperplasia)     Hx of it.   . S/P cholecystectomy   . Fall     C2 fracture/ 12/15/08  . Dementia     Past Surgical History  Procedure Laterality Date  . Coronary artery bypass graft      Loyce Dys, MS RD LDN Clinical Inpatient Dietitian Pager: [phone] Weekend/After hours pager: [phone]

## 2012-08-27 NOTE — Progress Notes (Signed)
PULMONARY  / CRITICAL CARE MEDICINE  Name: Wayne Montoya MRN: [id number] DOB: Jul 17, 1922    ADMISSION DATE:  08/26/2012 CONSULTATION DATE:  7/13   REFERRING MD :  [facility]  PRIMARY SERVICE:  [facility]   CHIEF COMPLAINT:  Left Pleural effusion -recurrent   BRIEF PATIENT DESCRIPTION:  79yo male SNF resident with mult medical problems including CHF, AFib on coumadin, chronic renal insufficiency, hx CVA, chronic C2 fx  admitted by [facility] 7/13 for R. Hip Fx after fall . Found to have recurrent Left Pleural effusion .  PCCM asked for consult for preop clearance /pl effusion.  SIGNIFICANT EVENTS / STUDIES:  CT C Spine >Old appearing ununited fracture of the base of the odontoid process  of C2. There is posterior subluxation of the odontoid process with respect to the body of C2  7/13 CT chest > Moderate, simple left pleural effusion. Compression atelectasis of the LLL and lingula  LINES / TUBES:   CULTURES:   ANTIBIOTICS:  SUBJECTIVE:  Lying in bed in C collar  +pain in hip  No dyspnea or cough , no fever   VITAL SIGNS: Temp:  [97.6 F (36.4 C)-98.6 F (37 C)] 98.6 F (37 C) (07/14 0750) Pulse Rate:  [73-89] 86 (07/14 0530) Resp:  [15-32] 31 (07/14 0530) BP: (113-189)/(61-92) 149/87 mmHg (07/14 0315) SpO2:  [92 %-100 %] 97 % (07/14 0530)  PHYSICAL EXAMINATION: General:  Elderly WM , chronically ill appearing , NAD  Neuro:  Alert, HOH, follows commands , nml grips, moves arms , wiggles toes HEENT:  Dry mucosa , PERRL ,  Neck:  No JVD, C collar  Cardiovascular: irreg/irreg , no m/r/g  Lungs:  diminshed BS in bases, no wheezing  Abdomen:  Soft , nt , BS + Musculoskeletal:  C collar, +right hip pain,  Skin: intact    Recent Labs Lab 08/26/12 0514 08/26/12 1729 08/27/12 0500  NA 141 137 135  K 3.6 3.8 3.9  CL 103 102 99  CO2  --  26 25  BUN 24* 25* 31*  CREATININE 1.70* 1.69* 2.05*  GLUCOSE 116* 141* 150*    Recent Labs Lab 08/26/12 0511 08/26/12 0514 08/27/12 0500   HGB 13.7 15.3 12.3*  HCT 41.7 45.0 38.7*  WBC 8.8  --  11.6*  PLT 191  --  174   Dg Chest 1 View  08/26/2012   *RADIOLOGY REPORT*  Clinical Data: Right hip fracture.  CHEST - 1 VIEW  Comparison: 08/16/2012  Findings: Backboard artifact.  Stable appearance of postoperative changes in the mediastinum.  Cardiac enlargement with pulmonary vascular congestion.  Left pleural effusion is increasing in size, possibly due to the supine technique.  Atelectasis or consolidation in the left lung base.  IMPRESSION: Cardiac enlargement with left pleural effusion and atelectasis or infiltration in the left lung base.   Original Report Authenticated By: Burman Nieves, M.D.   Dg Hip 1 View Right  08/26/2012   *RADIOLOGY REPORT*  Clinical Data: Right hip pain after fall.  RIGHT HIP - 1 VIEW  Comparison: 08/26/2012 at 0526 hours.  Findings: Comminuted intertrochanteric fracture of the right hip with varus angulation, impaction, and displacement of the lesser trochanteric fragment.  No subluxation of the hip joints.  The pelvis appears intact.  No focal bone lesion.  Vascular calcifications.  IMPRESSION: Comminuted intertrochanteric fracture of the right hip with varus angulation and displaced lesser trochanteric fragment.   Original Report Authenticated By: Burman Nieves, M.D.   Dg Hip Complete Right  08/26/2012   *RADIOLOGY REPORT*  Clinical Data: Right-sided hip pain after fall.  RIGHT HIP - COMPLETE 2+ VIEW  Comparison: None.  Findings: Backboard artifact.  There is a comminuted fracture of the intertrochanteric region of the right hip with varus angulation of the hip and impaction and displacement of fracture fragments. Visualized pelvis, SI joints, and symphysis pubis appear intact. Degenerative changes in the lower lumbar spine and hips.  Vascular calcifications.  Diffuse bone demineralization.  IMPRESSION: Comminuted intertrochanteric fracture of the right hip with varus angulation and displacement of the  lesser trochanteric fragment.   Original Report Authenticated By: Burman Nieves, M.D.   Ct Head Wo Contrast  08/26/2012   *RADIOLOGY REPORT*  Clinical Data: The patient tripped and fell onto the right hip. Coumadin.  CT HEAD WITHOUT CONTRAST  Technique:  Contiguous axial images were obtained from the base of the skull through the vertex without contrast.  Comparison: 06/25/2012  Findings: Diffuse cerebral atrophy.  Low attenuation changes in the deep white matter consistent with central atrophy.  No ventricular dilatation.  Old lacunar infarct in the left basal ganglion.  No mass effect or midline shift.  No abnormal extra-axial fluid collections.  Gray-white matter junctions are distinct.  Basal cisterns are not effaced.  No evidence of acute intracranial hemorrhage.  Prominent vertebral basilar and carotid calcifications.  Small soft tissue hematoma over the right anterior frontal region.  Opacification of the left maxillary antrum is likely inflammatory.  Sinuses demonstrate clearing since previous study.  IMPRESSION: No acute intracranial abnormalities.  Chronic atrophy and small vessel ischemic changes.   Original Report Authenticated By: Burman Nieves, M.D.   Ct Chest Wo Contrast  08/26/2012   *RADIOLOGY REPORT*  Clinical Data: Evaluate for pleural effusion.  Fall with cervical spine and hip fractures.  CT CHEST WITHOUT CONTRAST  Technique:  Multidetector CT imaging of the chest was performed following the standard protocol without IV contrast.  Comparison: 06/27/2012.  Findings:  THORACIC INLET/BODY WALL:  Gynecomastia. Small thyroid nodules, considered incidental.  MEDIASTINUM:  Chronic cardiomegaly.  No pericardial effusion.  Extensive coronary atherosclerotic calcification, status post CABG.  Diffuse aortic and great vessel atherosclerotic calcification, without evidence of acute abnormality.  There mildly enlarged lymph nodes, most notably in the AP window, lower paratracheal, subcarinal  stations.  These are largely stable from prior and likely reactive.  LUNG WINDOWS:  Water density left pleural effusion which is mainly layering. Effusion is moderate in volume, although larger than previous. Compressive atelectasis of the left lower lobe and lingula.  No consolidation or edema detected.  Mild dependent atelectasis on the right.  UPPER ABDOMEN:  Cholecystectomy.  OSSEOUS:  No acute fracture.  Advanced left glenohumeral osteoarthritis with loose bodies.  IMPRESSION:   Moderate, simple left pleural effusion, mildly larger than 06/27/2012. There is compressive atelectasis of the left lower lobe and lingula.   Original Report Authenticated By: Tiburcio Pea   Ct Cervical Spine Wo Contrast  08/26/2012   *RADIOLOGY REPORT*  Clinical Data: The patient tripped and fell.  CT CERVICAL SPINE WITHOUT CONTRAST  Technique:  Multidetector CT imaging of the cervical spine was performed. Multiplanar CT image reconstructions were also generated.  Comparison: 12/15/2008  Findings: There is a nonunited fracture of the base of the odontoid process with distraction of fracture fragments and with mild posterior subluxation of the odontoid fragment with respect to the base.  The fracture was demonstrated on the previous study but displacement and distraction was not present at that time.  The margins appear well corticated and this is likely chronic change although acute displacement is not entirely excluded.  Remainder of the cervical vertebrae and the facet joints are otherwise well- aligned.  Degenerative changes in the cervical spine with narrowed cervical interspaces and associated endplate hypertrophic changes throughout.  Postoperative versus degenerative fusion at C5-6. Degenerative changes are progressive since the previous study.  No vertebral compression deformities.  No prevertebral soft tissue swelling.  Lateral masses of C1 appear symmetrical.  Vascular calcifications in the cervical carotid vessels.   Incidental note of large pleural effusion versus pleural thickening in the left apex and vascular prominence in the upper lungs suggesting vascular congestion.  Calcification of the aorta.  IMPRESSION: Old appearing ununited fracture of the base of the odontoid process of C2.  There is posterior subluxation of the odontoid process with respect to the body of C2 which is developed since the previous study but is likely chronic.  Diffuse degenerative changes.  No acute fractures are demonstrated in the cervical spine.  Large left pleural effusion versus pleural thickening noted in the apex.   Original Report Authenticated By: Burman Nieves, M.D.   Dg Chest Portable 1 View  08/26/2012   *RADIOLOGY REPORT*  Clinical Data: Hip pain after fall.  PORTABLE CHEST - 1 VIEW  Comparison: 08/26/2012 at 0531 hours  Findings: Shallow inspiration.  Cardiac enlargement with mild pulmonary vascular congestion.  Moderate to large left pleural effusion with consolidation in the left lung base.  No significant change.  IMPRESSION: Cardiac enlargement with pulmonary vascular congestion.  Left pleural effusion and basilar consolidation or atelectasis.   Original Report Authenticated By: Burman Nieves, M.D.    ASSESSMENT / PLAN: 1. Recurrent Left Pleural Effusion ? Etiology  Prev admit 06/2012 with Left effusion s/p thoracentesis (950cc) felt possible parapneumonic +/- CHF (cytology neg ) . Re-occured on CXR 7/3 .  >  Plan  -Tr wbc/temp  - Thoracentesis in IR today - Evaluate the need for antibiotics following culture results  2. R. Hip Fracture  Plan  Per Ortho -surgery on hold until more stable   3.  Chronic C2 Fracture with new C2 subluxation - Pt to cont with C Collar .   4. CHF /Atrial Fib /CAD s/p CABG /HTN  recent echocardiogram completed July 2014 demonstrated a systolic function of 30-35% Coumadin at home  Plan  Cardiology following  Cont Diuresis  Keep Neg to even bal  Argatroban per cards (Hx of  HIT )   5. DM  Per Primary team   6. Acute on Chronic Renal insufficiency (baseline 1.4 7/3)   Recent Labs Lab 08/26/12 0514 08/26/12 1729 08/27/12 0500  NA 141 137 135  K 3.6 3.8 3.9  CL 103 102 99  CO2  --  26 25  GLUCOSE 116* 141* 150*  BUN 24* 25* 31*  CREATININE 1.70* 1.69* 2.05*  CALCIUM  --  8.9 9.6     Plan  Avoid nephrotoxins    6. Dementia Ottis Stain  Per Primary team   Today's Summary: Will wait for the results of the thoracentesis to determine further antibiotic therapy.  [facility] S-ACNP    Reviewed above, examined pt, and agree with assessment/plan.  Due to hip fx and issues with C spine, it will be too difficult to do bedside thoracentesis.  Order for IR to do procedure has been placed >> scheduled for 2 pm on 7/14.  Will f/u CXR and pleural fluid results.  Updated pt's son at  bedside.  Coralyn Helling, MD [facility] Pulmonary/Critical Care 08/27/2012, 11:53 AM Pager:  [phone] After 3pm call: [phone]

## 2012-08-27 NOTE — Progress Notes (Addendum)
[facility] Progress Note Wayne Montoya TEAM 1 - Stepdown/ICU TEAM   FARAZ PONCIANO NWG:[id number] DOB: Jan 20, 1923 DOA: 08/26/2012 PCP: Wilmer Floor., MD  Brief narrative: 79 year old male patient with multiple medical problems. Recently discharged last month to an assisted living facility after developing deconditioning during treatment for left pleural effusion related to chronic heart failure as well as treatment for healthcare acquired pneumonia. He was sent back to the emergency department after tripping and falling into his shower. Patient is noted to have an old C2 fracture in 2011 but this was not surgically repaired. Evaluation in the ER this admission showed a new C2 subluxation on top of the old C2 fracture, recurrence of his left pleural effusion, and an acute right hip fracture comminuted in nature.  Since admission patient has undergone preoperative evaluation by Cardiology who fell from a congestive heart failure standpoint the patient was compensated and appropriate for surgery placing him at a moderate risk for cardiovascular complications. Neurosurgery evaluated the patient because of the new subluxation at the C2 level and the recommendations were to place a hard cervical collar and to minimize any manipulation of the neck during intubation. Please see the notes regarding discussion of risks and benefits with the patient and his family. He is also being evaluated by pulmonary medicine for preoperative thoracentesis.  Assessment/Plan:  Acute respiratory failure with hypoxia due to Pleural effusion, left (recurrent) -on [place] oxygen -Pulmonary medicine following -bedside vs IR thoracentesis -once completed likely can proceed with OR within 24 hours -cont Lasix  C2 cervical fracture (old) with new subluxation after fall -NS following -cont hard cervical collar -rec OR intubation utilizing Glidescope noting increased risk of spinal cord injury resulting in  quadraplegia  Intertrochanteric fracture of right hip -Ortho following (Dr Charlann Boxer) -plan operative repair when medically stable   DIABETES MELLITUS, TYPE II -CBG controlled -cont SSI  Atrial fibrillation / Heparin induced thrombocytopenia (HIT) -rate controlled -on chronic coumadin but has known HIT so bridging with Argatroban  CKD (chronic kidney disease) stage 3, GFR 30-59 ml/min -stable  HYPERTENSION -BP controlled  CAD, Coronary ARTERY BYPASS GRAFT -no sx's -moderate CV surgical risk per Cards evaluation -cont BB  Chronic systolic heart failure/EF 30% -compensated -cont LASIX  Right ventricular dilation -as above -avoid over diuresis   DVT prophylaxis: Argatroban Code Status: DO NOT RESUSCITATE Family Communication: Patient and his son at the bedside Disposition Plan: SDU   Consultants: Cardiology Neurosurgery Orthopedics Pulmonary medicine  Procedures: Thoracentesis - pending  Antibiotics: None  HPI/Subjective: Patient is awake but seems somewhat confused. Denies pain. Denies shortness of breath. Apparently prior to placement of hard cervical collar patient was complaining of some neck discomfort.  Objective: Blood pressure 133/71, pulse 87, temperature 97.6 F (36.4 C), temperature source Oral, resp. rate 15, height 5\' 10"  (1.778 m), weight 92.8 kg (204 lb 9.4 oz), SpO2 94.00%.  Intake/Output Summary (Last 24 hours) at 08/27/12 1316 Last data filed at 08/27/12 1150  Gross per 24 hour  Intake 337.67 ml  Output    600 ml  Net -262.33 ml    Exam: General: No acute respiratory distress Lungs: Coarse to auscultation bilaterally without wheezes or crackles but very diminished on left throughout entire lung, 2 L Cardiovascular: Irregular rate and rhythm without murmur gallop or rub normal S1 and S2, no peripheral edema or JVD Abdomen: Nontender, nondistended, soft, bowel sounds positive, no rebound, no ascites, no appreciable mass Musculoskeletal:  No significant cyanosis, clubbing of bilateral lower extremities Neurological: Hard  cervical collar intact, CN 2-12 grossly intact  Scheduled Meds: Scheduled Meds: . atorvastatin  80 mg Oral Daily  . furosemide  40 mg Intravenous BID  . hydrALAZINE  25 mg Oral TID  . insulin aspart  0-15 Units Subcutaneous Q4H  . isosorbide mononitrate  30 mg Oral Daily  . metoprolol succinate  25 mg Oral BID  . pantoprazole  40 mg Oral Daily  . potassium chloride SA  20 mEq Oral BID   Continuous Infusions: . sodium chloride 10 mL (08/26/12 1950)  . argatroban 66 mcg/min (08/27/12 1111)    Data Reviewed: Basic Metabolic Panel:  Recent Labs Lab 08/26/12 0514 08/26/12 1729 08/27/12 0500  NA 141 137 135  K 3.6 3.8 3.9  CL 103 102 99  CO2  --  26 25  GLUCOSE 116* 141* 150*  BUN 24* 25* 31*  CREATININE 1.70* 1.69* 2.05*  CALCIUM  --  8.9 9.6   Liver Function Tests:  Recent Labs Lab 08/26/12 1729 08/27/12 0500  AST 15  --   ALT 9  --   ALKPHOS 63  --   BILITOT 0.8  --   PROT 6.3 6.7  ALBUMIN 2.8* 3.0*   CBC:  Recent Labs Lab 08/26/12 0511 08/26/12 0514 08/27/12 0500  WBC 8.8  --  11.6*  HGB 13.7 15.3 12.3*  HCT 41.7 45.0 38.7*  MCV 81.9  --  82.3  PLT 191  --  174   BNP (last 3 results)  Recent Labs  06/22/12 1907 08/26/12 1004  PROBNP 4692.0* 3413.0*   CBG:  Recent Labs Lab 08/26/12 2037 08/27/12 0006 08/27/12 0416 08/27/12 0753 08/27/12 1225  GLUCAP 119* 120* 134* 140* 129*    Recent Results (from the past 240 hour(s))  MRSA PCR SCREENING     Status: None   Collection Time    08/26/12  9:49 AM      Result Value Range Status   MRSA by PCR NEGATIVE  NEGATIVE Final   Comment:            The GeneXpert MRSA Assay (FDA     approved for NASAL specimens     only), is one component of a     comprehensive MRSA colonization     surveillance program. It is not     intended to diagnose MRSA     infection nor to guide or     monitor treatment for     MRSA  infections.     Studies:  Recent x-ray studies have been reviewed in detail by the Attending Physician    Junious Silk, ANP [facility] Office  [phone] Pager [phone]  **If unable to reach the above provider after paging please contact the Flow Manager @ [phone]  On-Call/Text Page:      [email or web address]      password [email or web address]  If 7PM-7AM, please contact night-coverage [email or web address] Password [email or web address] 08/27/2012, 1:16 PM   LOS: 1 day   I have personally examined this patient and reviewed the entire database. I have reviewed the above note, made any necessary editorial changes, and agree with its content.  Lonia Blood, MD [facility]

## 2012-08-27 NOTE — Clinical Social Work Psychosocial (Signed)
     Clinical Social Work Department BRIEF PSYCHOSOCIAL ASSESSMENT 08/27/2012  Patient:  Wayne Montoya, Wayne Montoya     Account Number:  [id number]     Admit date:  08/26/2012  Clinical Social Worker:  Hulan Fray  Date/Time:  08/27/2012 01:29 PM  Referred by:  Physician  Date Referred:  08/26/2012 Referred for  SNF Placement   Other Referral:   Interview type:  Family Other interview type:   son- Zev Blue    PSYCHOSOCIAL DATA Living Status:  FACILITY Admitted from facility:  OTHER Level of care:  Assisted Living Primary support name:  Hurley Sobel Primary support relationship to patient:  CHILD, ADULT Degree of support available:   supportive    CURRENT CONCERNS Current Concerns  Post-Acute Placement   Other Concerns:    SOCIAL WORK ASSESSMENT / PLAN Clinical Social Worker received referral for patient needing short term SNF placement at discharge. CSW met with patient's son and wife and introduced self and explained reason for visit. Per son and wife, they reported that they are agreeable for Short term SNF placement at discharge. Per son and wife, they are agreeable for CSW to initate SNF search in [place]. Per son, his preferred facility is Nurse, learning disability as second choice. Family does not want [facility] Rehab or [facility] SNFs.    CSW will complete FL2 for MD's signature and update family when bed offers are made.   Assessment/plan status:  Psychosocial Support/Ongoing Assessment of Needs Other assessment/ plan:   Information/referral to community resources:   SNF packet was given    PATIENTS/FAMILYS RESPONSE TO PLAN OF CARE: Family reported that they are agreeable for CSW to initiate SNF search in [place]. Son prefers [facility] or [facility] as second choice. Son and wife reported that they are not interested in [facility] Rehab or [facility] SNFs.

## 2012-08-27 NOTE — Progress Notes (Signed)
ANTICOAGULATION CONSULT NOTE - Initial Consult  Pharmacy Consult for Argatroban Indication: atrial fibrillation  Allergies  Allergen Reactions  . Hydralazine Hcl Other (See Comments)    Dizziness  . Heparin Other (See Comments)    HIT per [place] MAR  . Moxifloxacin Other (See Comments)    Per [place] MAR  . Penicillins Other (See Comments)    Per [facility]  MAR    Patient Measurements: Height: 5\' 10"  (177.8 cm) Weight: 204 lb 9.4 oz (92.8 kg) (bed scale) IBW/kg (Calculated) : 73   Vital Signs: Temp: 98.6 F (37 C) (07/14 0750) Temp src: Oral (07/14 0750) BP: 149/87 mmHg (07/14 0315) Pulse Rate: 86 (07/14 0530)  Labs:  Recent Labs  08/26/12 0511 08/26/12 0514  08/26/12 1423 08/26/12 1729 08/27/12 0500 08/27/12 0925  HGB 13.7 15.3  --   --   --  12.3*  --   HCT 41.7 45.0  --   --   --  38.7*  --   PLT 191  --   --   --   --  174  --   APTT  --   --   < > 70* 88*  --  114*  LABPROT 18.5*  --   --   --   --   --   --   INR 1.59*  --   --   --   --   --   --   CREATININE  --  1.70*  --   --  1.69* 2.05*  --   < > = values in this interval not displayed.  Estimated Creatinine Clearance: 28.5 ml/min (by C-G formula based on Cr of 2.05).   Medical History: Past Medical History  Diagnosis Date  . CHF (congestive heart failure)     secondary to diastolic function. Echo 4/09: EF 55%  . CAD (coronary artery disease)     status post CABG in 1996  . Paroxysmal atrial fibrillation   . Diabetes mellitus     type was not specified  . HTN (hypertension)   . Hyperlipidemia   . Chronic renal insufficiency     with a base line creatinine of 1.6  . Cerebrovascular accident     Hx of it, Left sided facial droop  . Depression   . Carotid bruit     s/p carotids in 2006, showed mild hard plaque in the bulbs and proximal bifurcations, left greater that right. Both ICAs take steep posterior dives. Right ICA 0-39% stenosis; left ICA 40-59% stenosis.  Carrotids 9/20: 0-39%  . BPH (benign  prostatic hyperplasia)     Hx of it.  . S/P cholecystectomy   . Fall     C2 fracture/ 12/15/08  . Dementia     Assessment: 48 YOM admitted with a hip fracture with history of AFib on warfarin at home. Has a history of HIT and is being bridged until surgery with Argatroban. First two aPTTs were in range, but this morning it is elevated at 114 seconds. Per protocol, this falls in the range which requires a decrease in argatroban by 30%. No bleeding noted.  Goal of Therapy:  aPTT 50-90 seconds Monitor platelets by anticoagulation protocol: Yes   Plan:  1. Decrease Argatroban to 47mcg/min 2. Check aPTT in 2 hours 3. Daily aPTTs when in range 4. Daily CBC 5. Follow up surgical plans, and transition back to oral anticoagulants  Kosta Schnitzler D. Adryanna Friedt, PharmD Clinical Pharmacist Pager: [phone] 08/27/2012 10:55 AM

## 2012-08-27 NOTE — Progress Notes (Signed)
Pharmacist Heart Failure Core Measure Documentation  Assessment: Wayne Montoya has an EF documented as 30-35% on 7/3 by Dr. Elease Hashimoto.  Rationale: Heart failure patients with left ventricular systolic dysfunction (LVSD) and an EF < 40% should be prescribed an angiotensin converting enzyme inhibitor (ACEI) or angiotensin receptor blocker (ARB) at discharge unless a contraindication is documented in the medical record.  This patient is not currently on an ACEI or ARB for HF.  This note is being placed in the record in order to provide documentation that a contraindication to the use of these agents is present for this encounter.  ACE Inhibitor or Angiotensin Receptor Blocker is contraindicated (specify all that apply)  []   ACEI allergy AND ARB allergy []   Angioedema []   Moderate or severe aortic stenosis []   Hyperkalemia []   Hypotension []   Renal artery stenosis [x]   Worsening renal function, preexisting renal disease or dysfunction   [name] 08/27/2012 3:04 PM

## 2012-08-27 NOTE — Progress Notes (Signed)
Pt incont. Earlier.  Pt unable to void.  Bladder scan showed 528cc.   md notified.  Pt incont again.  New orders received to place foley cath.  Placed 38fr. Foley, sterile tech. 10cc inserted in balloon.  Leg strap in place.  Foley bag labeled.  Pt educated.  Will continue to monitor. Emilie [place] Liter

## 2012-08-28 ENCOUNTER — Inpatient Hospital Stay (HOSPITAL_COMMUNITY): Payer: Medicare Other

## 2012-08-28 DIAGNOSIS — J9600 Acute respiratory failure, unspecified whether with hypoxia or hypercapnia: Secondary | ICD-10-CM

## 2012-08-28 DIAGNOSIS — S72143A Displaced intertrochanteric fracture of unspecified femur, initial encounter for closed fracture: Principal | ICD-10-CM

## 2012-08-28 DIAGNOSIS — I5022 Chronic systolic (congestive) heart failure: Secondary | ICD-10-CM

## 2012-08-28 DIAGNOSIS — J90 Pleural effusion, not elsewhere classified: Secondary | ICD-10-CM

## 2012-08-28 DIAGNOSIS — I4891 Unspecified atrial fibrillation: Secondary | ICD-10-CM

## 2012-08-28 LAB — CBC
HCT: 37.2 % — ABNORMAL LOW (ref 39.0–52.0)
MCHC: 31.7 g/dL (ref 30.0–36.0)
MCV: 83.4 fL (ref 78.0–100.0)
Platelets: 185 10*3/uL (ref 150–400)
RDW: 16.4 % — ABNORMAL HIGH (ref 11.5–15.5)

## 2012-08-28 LAB — COMPREHENSIVE METABOLIC PANEL
Albumin: 3.1 g/dL — ABNORMAL LOW (ref 3.5–5.2)
BUN: 40 mg/dL — ABNORMAL HIGH (ref 6–23)
Calcium: 9.6 mg/dL (ref 8.4–10.5)
Creatinine, Ser: 2.09 mg/dL — ABNORMAL HIGH (ref 0.50–1.35)
GFR calc Af Amer: 31 mL/min — ABNORMAL LOW (ref >90)
Glucose, Bld: 122 mg/dL — ABNORMAL HIGH (ref 70–99)
Total Protein: 6.9 g/dL (ref 6.0–8.3)

## 2012-08-28 LAB — APTT
aPTT: 101 seconds — ABNORMAL HIGH (ref 24–37)
aPTT: 98 seconds — ABNORMAL HIGH (ref 24–37)
aPTT: 70 seconds — ABNORMAL HIGH (ref 24–37)

## 2012-08-28 LAB — GLUCOSE, CAPILLARY: Glucose-Capillary: 138 mg/dL — ABNORMAL HIGH (ref 70–99)

## 2012-08-28 MED ORDER — ARGATROBAN 50 MG/50ML IV SOLN: 0.2500 ug/kg/min | INTRAVENOUS | Status: DC

## 2012-08-28 MED ORDER — ARGATROBAN 50 MG/50ML IV SOLN
0.4000 ug/kg/min | INTRAVENOUS | Status: DC
  Administered 2012-08-28: 0.4 ug/kg/min via INTRAVENOUS
  Filled 2012-08-28 (×2): qty 50

## 2012-08-28 NOTE — Progress Notes (Signed)
PATIENT DETAILS Name: Wayne Montoya Age: 79 y.o. Sex: male Date of Birth: 26-Mar-1922 Admit Date: 08/26/2012 Admitting Physician Joseph Art, DO ZOX:[id number], Mila Homer., MD  Subjective: Confused-but stable overnight  Assessment/Plan: Acute respiratory failure with hypoxia due to Pleural effusion, left (recurrent)  -on [place] oxygen -stable with 2 Litres -Pulmonary medicine following  -IR could not perform thoracentesis yesterday-have asked [facility] to consult to see if they can do a thoracocentesis under Gen Anesthesia, can go for ORIF after thoracocentesis -once completed likely can proceed with OR within 24 hours   C2 cervical fracture (old) with new subluxation after fall  -NS following  -cont hard cervical collar  -rec OR intubation utilizing Glidescope noting increased risk of spinal cord injury resulting in quadraplegia   Intertrochanteric fracture of right hip  -Ortho following (Dr Charlann Boxer)  -plan operative repair when medically stable   DIABETES MELLITUS, TYPE II  -CBG controlled  -cont SSI  -resume Lantus-when CBG's start rising  Atrial fibrillation / Heparin induced thrombocytopenia (HIT)  -rate controlled  -on chronic coumadin but has known HIT so bridging with Argatroban-dosed per pharmacy  CKD (chronic kidney disease) stage 3, GFR 30-59 ml/min  -stable-but slowly rising-monitor for now -avoid nephrotoxic agents  HYPERTENSION  -BP controlled  -continue with Metoprolol, Imdur and Hydralazine  CAD, Coronary ARTERY BYPASS GRAFT  -no sx's  -moderate CV surgical risk per Cards evaluation  -cont BB   Chronic systolic heart failure/EF 30%  -compensated   Right ventricular dilation  -as above  -avoid over diuresis  Disposition: Remain inpatient  DVT Prophylaxis: Not needed as on therapeutic anticoagulation  Code Status:  DNR  Family Communication None at bedside  Procedures:  None  CONSULTS:  cardiology, pulmonary/intensive care and orthopedic  surgery   MEDICATIONS: Scheduled Meds: . atorvastatin  80 mg Oral Daily  . hydrALAZINE  25 mg Oral TID  . insulin aspart  0-20 Units Subcutaneous Q4H  . isosorbide mononitrate  30 mg Oral Daily  . metoprolol succinate  25 mg Oral BID  . pantoprazole  40 mg Oral Daily   Continuous Infusions: . sodium chloride 10 mL/hr (08/27/12 1346)  . argatroban 0.4 mcg/kg/min (08/28/12 0545)   PRN Meds:.acetaminophen, alum & mag hydroxide-simeth, bisacodyl, LORazepam, morphine injection, oxyCODONE  Antibiotics: Anti-infectives   None       PHYSICAL EXAM: Vital signs in last 24 hours: Filed Vitals:   08/27/12 2353 08/28/12 0338 08/28/12 0800 08/28/12 0924  BP: 146/72 151/70 164/89 164/89  Pulse: 86 93 96 102  Temp:  98.6 F (37 C) 98.2 F (36.8 C)   TempSrc:  Oral Oral   Resp: 20 19 20    Height:      Weight:      SpO2: 99% 99% 100%     Weight change:  Filed Weights   08/26/12 0800  Weight: 92.8 kg (204 lb 9.4 oz)   Body mass index is 29.36 kg/(m^2).   Gen Exam: Awake but confused, clear speech.   Neck: Supple, No JVD.   Chest: B/L Clear.   CVS: S1 S2 irreegular Abdomen: soft, BS +, non tender, non distended.  Extremities: no edema, lower extremities warm to touch. Neurologic: Non Focal.   Skin: No Rash.   Wounds: N/A.    Intake/Output from previous day:  Intake/Output Summary (Last 24 hours) at 08/28/12 1030 Last data filed at 08/28/12 1000  Gross per 24 hour  Intake 306.45 ml  Output   1300 ml  Net -993.55 ml  LAB RESULTS: CBC  Recent Labs Lab 08/26/12 0511 08/26/12 0514 08/27/12 0500 08/28/12 0410  WBC 8.8  --  11.6* 11.3*  HGB 13.7 15.3 12.3* 11.8*  HCT 41.7 45.0 38.7* 37.2*  PLT 191  --  174 185  MCV 81.9  --  82.3 83.4  MCH 26.9  --  26.2 26.5  MCHC 32.9  --  31.8 31.7  RDW 16.3*  --  16.3* 16.4*    Chemistries   Recent Labs Lab 08/26/12 0514 08/26/12 1729 08/27/12 0500 08/28/12 0410  NA 141 137 135 137  K 3.6 3.8 3.9 3.7  CL  103 102 99 98  CO2  --  26 25 26   GLUCOSE 116* 141* 150* 122*  BUN 24* 25* 31* 40*  CREATININE 1.70* 1.69* 2.05* 2.09*  CALCIUM  --  8.9 9.6 9.6    CBG:  Recent Labs Lab 08/27/12 1649 08/27/12 2012 08/27/12 2352 08/28/12 0334 08/28/12 0803  GLUCAP 129* 159* 103* 114* 138*    GFR Estimated Creatinine Clearance: 28 ml/min (by C-G formula based on Cr of 2.09).  Coagulation profile  Recent Labs Lab 08/26/12 0511  INR 1.59*    Cardiac Enzymes No results found for this basename: CK, CKMB, TROPONINI, MYOGLOBIN,  in the last 168 hours  No components found with this basename: POCBNP,  No results found for this basename: DDIMER,  in the last 72 hours No results found for this basename: HGBA1C,  in the last 72 hours No results found for this basename: CHOL, HDL, LDLCALC, TRIG, CHOLHDL, LDLDIRECT,  in the last 72 hours No results found for this basename: TSH, T4TOTAL, FREET3, T3FREE, THYROIDAB,  in the last 72 hours No results found for this basename: VITAMINB12, FOLATE, FERRITIN, TIBC, IRON, RETICCTPCT,  in the last 72 hours No results found for this basename: LIPASE, AMYLASE,  in the last 72 hours  Urine Studies No results found for this basename: UACOL, UAPR, USPG, UPH, UTP, UGL, UKET, UBIL, UHGB, UNIT, UROB, ULEU, UEPI, UWBC, URBC, UBAC, CAST, CRYS, UCOM, BILUA,  in the last 72 hours  MICROBIOLOGY: Recent Results (from the past 240 hour(s))  MRSA PCR SCREENING     Status: None   Collection Time    08/26/12  9:49 AM      Result Value Range Status   MRSA by PCR NEGATIVE  NEGATIVE Final   Comment:            The GeneXpert MRSA Assay (FDA     approved for NASAL specimens     only), is one component of a     comprehensive MRSA colonization     surveillance program. It is not     intended to diagnose MRSA     infection nor to guide or     monitor treatment for     MRSA infections.    RADIOLOGY STUDIES/RESULTS: Dg Chest 1 View  08/26/2012   *RADIOLOGY REPORT*   Clinical Data: Right hip fracture.  CHEST - 1 VIEW  Comparison: 08/16/2012  Findings: Backboard artifact.  Stable appearance of postoperative changes in the mediastinum.  Cardiac enlargement with pulmonary vascular congestion.  Left pleural effusion is increasing in size, possibly due to the supine technique.  Atelectasis or consolidation in the left lung base.  IMPRESSION: Cardiac enlargement with left pleural effusion and atelectasis or infiltration in the left lung base.   Original Report Authenticated By: Burman Nieves, M.D.   Dg Chest 2 View  08/16/2012   *RADIOLOGY REPORT*  Clinical Data: Heart failure, history CHF, coronary artery disease post CABG, hypertension, diabetes, chronic renal insufficiency  CHEST - 2 VIEW  Comparison: 06/28/2012  Findings: Enlargement of cardiac silhouette post CABG. Pulmonary vascular congestion Calcified tortuous aorta. Moderate left pleural effusion with significant compressive atelectasis of lower left lung. Underlying infiltrate not excluded in this setting. Slight chronic accentuation perihilar markings grossly unchanged. Remaining lungs clear.  IMPRESSION: Enlargement of cardiac silhouette with pulmonary vascular congestion. Significant increase in left pleural effusion and left basilar atelectasis since prior exam.   Original Report Authenticated By: Ulyses Southward, M.D.   Dg Hip 1 View Right  08/26/2012   *RADIOLOGY REPORT*  Clinical Data: Right hip pain after fall.  RIGHT HIP - 1 VIEW  Comparison: 08/26/2012 at 0526 hours.  Findings: Comminuted intertrochanteric fracture of the right hip with varus angulation, impaction, and displacement of the lesser trochanteric fragment.  No subluxation of the hip joints.  The pelvis appears intact.  No focal bone lesion.  Vascular calcifications.  IMPRESSION: Comminuted intertrochanteric fracture of the right hip with varus angulation and displaced lesser trochanteric fragment.   Original Report Authenticated By: Burman Nieves,  M.D.   Dg Hip Complete Right  08/26/2012   *RADIOLOGY REPORT*  Clinical Data: Right-sided hip pain after fall.  RIGHT HIP - COMPLETE 2+ VIEW  Comparison: None.  Findings: Backboard artifact.  There is a comminuted fracture of the intertrochanteric region of the right hip with varus angulation of the hip and impaction and displacement of fracture fragments. Visualized pelvis, SI joints, and symphysis pubis appear intact. Degenerative changes in the lower lumbar spine and hips.  Vascular calcifications.  Diffuse bone demineralization.  IMPRESSION: Comminuted intertrochanteric fracture of the right hip with varus angulation and displacement of the lesser trochanteric fragment.   Original Report Authenticated By: Burman Nieves, M.D.   Ct Head Wo Contrast  08/26/2012   *RADIOLOGY REPORT*  Clinical Data: The patient tripped and fell onto the right hip. Coumadin.  CT HEAD WITHOUT CONTRAST  Technique:  Contiguous axial images were obtained from the base of the skull through the vertex without contrast.  Comparison: 06/25/2012  Findings: Diffuse cerebral atrophy.  Low attenuation changes in the deep white matter consistent with central atrophy.  No ventricular dilatation.  Old lacunar infarct in the left basal ganglion.  No mass effect or midline shift.  No abnormal extra-axial fluid collections.  Gray-white matter junctions are distinct.  Basal cisterns are not effaced.  No evidence of acute intracranial hemorrhage.  Prominent vertebral basilar and carotid calcifications.  Small soft tissue hematoma over the right anterior frontal region.  Opacification of the left maxillary antrum is likely inflammatory.  Sinuses demonstrate clearing since previous study.  IMPRESSION: No acute intracranial abnormalities.  Chronic atrophy and small vessel ischemic changes.   Original Report Authenticated By: Burman Nieves, M.D.   Ct Chest Wo Contrast  08/26/2012   *RADIOLOGY REPORT*  Clinical Data: Evaluate for pleural  effusion.  Fall with cervical spine and hip fractures.  CT CHEST WITHOUT CONTRAST  Technique:  Multidetector CT imaging of the chest was performed following the standard protocol without IV contrast.  Comparison: 06/27/2012.  Findings:  THORACIC INLET/BODY WALL:  Gynecomastia. Small thyroid nodules, considered incidental.  MEDIASTINUM:  Chronic cardiomegaly.  No pericardial effusion.  Extensive coronary atherosclerotic calcification, status post CABG.  Diffuse aortic and great vessel atherosclerotic calcification, without evidence of acute abnormality.  There mildly enlarged lymph nodes, most notably in the AP window, lower paratracheal, subcarinal stations.  These are largely stable from prior and likely reactive.  LUNG WINDOWS:  Water density left pleural effusion which is mainly layering. Effusion is moderate in volume, although larger than previous. Compressive atelectasis of the left lower lobe and lingula.  No consolidation or edema detected.  Mild dependent atelectasis on the right.  UPPER ABDOMEN:  Cholecystectomy.  OSSEOUS:  No acute fracture.  Advanced left glenohumeral osteoarthritis with loose bodies.  IMPRESSION:   Moderate, simple left pleural effusion, mildly larger than 06/27/2012. There is compressive atelectasis of the left lower lobe and lingula.   Original Report Authenticated By: Tiburcio Pea   Ct Cervical Spine Wo Contrast  08/26/2012   *RADIOLOGY REPORT*  Clinical Data: The patient tripped and fell.  CT CERVICAL SPINE WITHOUT CONTRAST  Technique:  Multidetector CT imaging of the cervical spine was performed. Multiplanar CT image reconstructions were also generated.  Comparison: 12/15/2008  Findings: There is a nonunited fracture of the base of the odontoid process with distraction of fracture fragments and with mild posterior subluxation of the odontoid fragment with respect to the base.  The fracture was demonstrated on the previous study but displacement and distraction was not present  at that time.  The margins appear well corticated and this is likely chronic change although acute displacement is not entirely excluded.  Remainder of the cervical vertebrae and the facet joints are otherwise well- aligned.  Degenerative changes in the cervical spine with narrowed cervical interspaces and associated endplate hypertrophic changes throughout.  Postoperative versus degenerative fusion at C5-6. Degenerative changes are progressive since the previous study.  No vertebral compression deformities.  No prevertebral soft tissue swelling.  Lateral masses of C1 appear symmetrical.  Vascular calcifications in the cervical carotid vessels.  Incidental note of large pleural effusion versus pleural thickening in the left apex and vascular prominence in the upper lungs suggesting vascular congestion.  Calcification of the aorta.  IMPRESSION: Old appearing ununited fracture of the base of the odontoid process of C2.  There is posterior subluxation of the odontoid process with respect to the body of C2 which is developed since the previous study but is likely chronic.  Diffuse degenerative changes.  No acute fractures are demonstrated in the cervical spine.  Large left pleural effusion versus pleural thickening noted in the apex.   Original Report Authenticated By: Burman Nieves, M.D.   [place] Chest  08/28/2012   *RADIOLOGY REPORT*  Clinical Data: Hip fracture.  Left pleural effusion.  CHEST ULTRASOUND  Comparison: CT 08/26/2012 and earlier studies  Findings: Survey ultrasound of the left hemithorax was performed. Moderate pleural effusion was demonstrated.  However, because of patient positioning limitations secondary to his pain and mobility restrictions, a safe window for percutaneous thoracentesis could not be achieved.  IMPRESSION:  1.  Moderate left pleural effusion.  No safe access could be achieved for thoracentesis due to pain and mobility issues.  We would be happy re-attempt once these issues are  controlled.   Original Report Authenticated By: D. Andria Rhein, MD   Dg Chest Port 1 View  08/28/2012   *RADIOLOGY REPORT*  Clinical Data: Follow up pleural effusion.  Shortness of breath.  PORTABLE CHEST - 1 VIEW  Comparison: Chest x-ray 08/26/2012.  Findings: No significant change in a large left-sided pleural effusion.  The majority of this pleural fluid is now throughout the mid and lower left hemithorax, with less fluid layering around the upper lung (this is likely positional as today's study is semi erect versus supine on  the prior).  Extensive opacities throughout the left lung, particularly at the left base, likely reflect a combination of atelectasis and/or consolidation.  Mild pulmonary venous congestion, without frank pulmonary edema.  Mild cardiomegaly is unchanged. The patient is rotated to the right on today's exam, resulting in distortion of the mediastinal contours and reduced diagnostic sensitivity and specificity for mediastinal pathology.  Atherosclerosis in the thoracic aorta.  Status post median sternotomy for CABG.  Surgical clips project over the right upper quadrant of the abdomen, likely from prior cholecystectomy. Advanced degenerative changes of osteoarthritis are incidentally noted in the left glenohumeral joint.  IMPRESSION: 1.  Allowing for differences in patient positioning, the radiographic appearance of chest is essentially unchanged, as detailed above.   Original Report Authenticated By: Trudie Reed, M.D.   Dg Chest Portable 1 View  08/26/2012   *RADIOLOGY REPORT*  Clinical Data: Hip pain after fall.  PORTABLE CHEST - 1 VIEW  Comparison: 08/26/2012 at 0531 hours  Findings: Shallow inspiration.  Cardiac enlargement with mild pulmonary vascular congestion.  Moderate to large left pleural effusion with consolidation in the left lung base.  No significant change.  IMPRESSION: Cardiac enlargement with pulmonary vascular congestion.  Left pleural effusion and basilar  consolidation or atelectasis.   Original Report Authenticated By: Burman Nieves, M.D.    Jeoffrey Massed, MD  [facility] Pager:[phone]  If 7PM-7AM, please contact night-coverage [email or web address] Password [email or web address] 08/28/2012, 10:30 AM   LOS: 2 days

## 2012-08-28 NOTE — Progress Notes (Addendum)
ANTICOAGULATION CONSULT NOTE Pharmacy Consult for Argatroban Indication: h/o Afib  Allergies  Allergen Reactions  . Hydralazine Hcl Other (See Comments)    Dizziness  . Heparin Other (See Comments)    HIT per [facility]  . Moxifloxacin Other (See Comments)    Per [facility]  . Penicillins Other (See Comments)    Per [facility]    Patient Measurements: Height: 5\' 10"  (177.8 cm) Weight: 204 lb 9.4 oz (92.8 kg) (bed scale) IBW/kg (Calculated) : 73   Vital Signs: Temp: 98.6 F (37 C) (07/15 0338) Temp src: Oral (07/15 0338) BP: 151/70 mmHg (07/15 0338) Pulse Rate: 93 (07/15 0338)  Labs:  Recent Labs  08/26/12 0511  08/26/12 0514  08/26/12 1729 08/27/12 0500 08/27/12 0925 08/27/12 2129 08/28/12 0410  HGB 13.7  --  15.3  --   --  12.3*  --   --  11.8*  HCT 41.7  --  45.0  --   --  38.7*  --   --  37.2*  PLT 191  --   --   --   --  174  --   --  185  APTT  --   --   --   < > 88*  --  114* 122* 101*  LABPROT 18.5*  --   --   --   --   --   --   --   --   INR 1.59*  --   --   --   --   --   --   --   --   CREATININE  --   < > 1.70*  --  1.69* 2.05*  --   --  2.09*  < > = values in this interval not displayed.  Estimated Creatinine Clearance: 28 ml/min (by C-G formula based on Cr of 2.09).  Assessment: 79 YO Male with h/o of AFib, Coumadin on hold for thoracentesis and hip surgery, for Argatroban.  Goal of Therapy:  aPTT 50-90 seconds Monitor platelets by anticoagulation protocol: Yes   Plan:  Decrease Argatroban 0.4 mcg/kg/min Check PTT in 4 hrs  Geannie Risen, PharmD, BCPS  08/28/2012 5:29 AM

## 2012-08-28 NOTE — Progress Notes (Addendum)
ANTICOAGULATION CONSULT NOTE Pharmacy Consult for Argatroban Indication: h/o Afib  Allergies  Allergen Reactions  . Hydralazine Hcl Other (See Comments)    Dizziness  . Heparin Other (See Comments)    HIT per [facility]  . Moxifloxacin Other (See Comments)    Per [facility]  . Penicillins Other (See Comments)    Per [facility]    Patient Measurements: Height: 5\' 10"  (177.8 cm) Weight: 204 lb 9.4 oz (92.8 kg) (bed scale) IBW/kg (Calculated) : 73   Vital Signs: Temp: 98.9 F (37.2 C) (07/15 1158) Temp src: Oral (07/15 1158) BP: 112/67 mmHg (07/15 1158) Pulse Rate: 90 (07/15 1158)  Labs:  Recent Labs  08/26/12 0511  08/26/12 0514  08/26/12 1729 08/27/12 0500  08/28/12 0410 08/28/12 0945 08/28/12 1435  HGB 13.7  --  15.3  --   --  12.3*  --  11.8*  --   --   HCT 41.7  --  45.0  --   --  38.7*  --  37.2*  --   --   PLT 191  --   --   --   --  174  --  185  --   --   APTT  --   --   --   < > 88*  --   < > 101* 98* 77*  LABPROT 18.5*  --   --   --   --   --   --   --   --   --   INR 1.59*  --   --   --   --   --   --   --   --   --   CREATININE  --   < > 1.70*  --  1.69* 2.05*  --  2.09*  --   --   < > = values in this interval not displayed.  Estimated Creatinine Clearance: 28 ml/min (by C-G formula based on Cr of 2.09).  Assessment: 79 YO Male with h/o of AFib, Coumadin on hold for thoracentesis and hip surgery now on Argatroban at 0.4 mcg/kg/min. An aPTT is 77 after decrease to 0.31mcg/kg/min. Patient to go for thoracentesis under general anesthesia.   Goal of Therapy:  aPTT 50-90 seconds Monitor platelets by anticoagulation protocol: Yes   Plan:  -Continue Argatroban at 0.3 mcg/kg/min  -Check PTT in 4 hrs  Harland German, Pharm D 08/28/2012 3:33 PM   Addendum: Repeat PTT is 70- therapeutic for goal of 50 to 90.   Continue Argatroban at 0.3 mcg/kg/min. Follow-up PTT daily.   Link Snuffer, PharmD, BCPS Clinical Pharmacist [phone] 08/28/2012, 7:09 PM

## 2012-08-28 NOTE — Progress Notes (Signed)
PULMONARY  / CRITICAL CARE MEDICINE  Name: Wayne Montoya MRN: [id number] DOB: Mar 16, 1922    ADMISSION DATE:  08/26/2012 CONSULTATION DATE:  7/13   REFERRING MD :  [facility]  PRIMARY SERVICE:  [facility]   CHIEF COMPLAINT:  Left Pleural effusion -recurrent   BRIEF PATIENT DESCRIPTION:  79yo male SNF resident with mult medical problems including CHF, AFib on coumadin, chronic renal insufficiency, hx CVA, chronic C2 fx  admitted by [facility] 7/13 for R. Hip Fx after fall . Found to have recurrent Left Pleural effusion .  PCCM asked for consult for preop clearance /pl effusion.  SIGNIFICANT EVENTS / STUDIES:  CT C Spine >Old appearing ununited fracture of the base of the odontoid process  of C2. There is posterior subluxation of the odontoid process with respect to the body of C2  7/13 CT chest > Moderate, simple left pleural effusion. Compression atelectasis of the LLL and lingula  LINES / TUBES:   CULTURES:   ANTIBIOTICS:  SUBJECTIVE:  Lying in bed in C collar  +pain in hip and left shoulder No dyspnea or cough , no fever   VITAL SIGNS: Temp:  [97.6 F (36.4 C)-99.1 F (37.3 C)] 98.2 F (36.8 C) (07/15 0800) Pulse Rate:  [85-93] 93 (07/15 0338) Resp:  [15-20] 19 (07/15 0338) BP: (133-151)/(70-88) 151/70 mmHg (07/15 0338) SpO2:  [94 %-99 %] 99 % (07/15 0338)  PHYSICAL EXAMINATION: General:  Elderly WM , chronically ill appearing , NAD  Neuro:  Alert, HOH, follows commands , nml grips, moves arms , wiggles toes HEENT:  Dry mucosa , PERRL ,  Neck:  No JVD, C collar  Cardiovascular: irreg/irreg , no m/r/g  Lungs:  diminshed BS in bases, no wheezing  Abdomen:  Soft , nt , BS + Musculoskeletal:  C collar, +right hip pain,  Skin: intact    Recent Labs Lab 08/26/12 1729 08/27/12 0500 08/28/12 0410  NA 137 135 137  K 3.8 3.9 3.7  CL 102 99 98  CO2 26 25 26   BUN 25* 31* 40*  CREATININE 1.69* 2.05* 2.09*  GLUCOSE 141* 150* 122*    Recent Labs Lab 08/26/12 0511 08/26/12 0514  08/27/12 0500 08/28/12 0410  HGB 13.7 15.3 12.3* 11.8*  HCT 41.7 45.0 38.7* 37.2*  WBC 8.8  --  11.6* 11.3*  PLT 191  --  174 185   Ct Chest Wo Contrast  08/26/2012   *RADIOLOGY REPORT*  Clinical Data: Evaluate for pleural effusion.  Fall with cervical spine and hip fractures.  CT CHEST WITHOUT CONTRAST  Technique:  Multidetector CT imaging of the chest was performed following the standard protocol without IV contrast.  Comparison: 06/27/2012.  Findings:  THORACIC INLET/BODY WALL:  Gynecomastia. Small thyroid nodules, considered incidental.  MEDIASTINUM:  Chronic cardiomegaly.  No pericardial effusion.  Extensive coronary atherosclerotic calcification, status post CABG.  Diffuse aortic and great vessel atherosclerotic calcification, without evidence of acute abnormality.  There mildly enlarged lymph nodes, most notably in the AP window, lower paratracheal, subcarinal stations.  These are largely stable from prior and likely reactive.  LUNG WINDOWS:  Water density left pleural effusion which is mainly layering. Effusion is moderate in volume, although larger than previous. Compressive atelectasis of the left lower lobe and lingula.  No consolidation or edema detected.  Mild dependent atelectasis on the right.  UPPER ABDOMEN:  Cholecystectomy.  OSSEOUS:  No acute fracture.  Advanced left glenohumeral osteoarthritis with loose bodies.  IMPRESSION:   Moderate, simple left pleural effusion, mildly  larger than 06/27/2012. There is compressive atelectasis of the left lower lobe and lingula.   Original Report Authenticated By: Tiburcio Pea   Dg Chest Port 1 View  08/28/2012   *RADIOLOGY REPORT*  Clinical Data: Follow up pleural effusion.  Shortness of breath.  PORTABLE CHEST - 1 VIEW  Comparison: Chest x-ray 08/26/2012.  Findings: No significant change in a large left-sided pleural effusion.  The majority of this pleural fluid is now throughout the mid and lower left hemithorax, with less fluid layering around  the upper lung (this is likely positional as today's study is semi erect versus supine on the prior).  Extensive opacities throughout the left lung, particularly at the left base, likely reflect a combination of atelectasis and/or consolidation.  Mild pulmonary venous congestion, without frank pulmonary edema.  Mild cardiomegaly is unchanged. The patient is rotated to the right on today's exam, resulting in distortion of the mediastinal contours and reduced diagnostic sensitivity and specificity for mediastinal pathology.  Atherosclerosis in the thoracic aorta.  Status post median sternotomy for CABG.  Surgical clips project over the right upper quadrant of the abdomen, likely from prior cholecystectomy. Advanced degenerative changes of osteoarthritis are incidentally noted in the left glenohumeral joint.  IMPRESSION: 1.  Allowing for differences in patient positioning, the radiographic appearance of chest is essentially unchanged, as detailed above.   Original Report Authenticated By: Trudie Reed, M.D.    ASSESSMENT / PLAN: 1. Recurrent Left Pleural Effusion ? Etiology  Prev admit 06/2012 with Left effusion s/p thoracentesis (950cc) felt possible parapneumonic +/- CHF (cytology neg ) . Re-occured on CXR 7/3 .  >  Plan  -Tr wbc/temp  - Pt was unable to tolerate being positioned for thoracentesis in IR - Evaluate the need for antibiotics following culture results  2. R. Hip Fracture  Plan  Per Ortho -surgery on hold until more stable   3.  Chronic C2 Fracture with new C2 subluxation - Pt to cont with C Collar .   4. CHF /Atrial Fib /CAD s/p CABG /HTN  recent echocardiogram completed July 2014 demonstrated a systolic function of 30-35% Coumadin at home  Plan  Cardiology following  Cont Diuresis  Keep Neg to even bal  Argatroban per cards (Hx of HIT )   5. DM  Per Primary team   6. Acute on Chronic Renal insufficiency (baseline 1.4 7/3)   Recent Labs Lab 08/26/12 0514  08/26/12 1729 08/27/12 0500 08/28/12 0410  NA 141 137 135 137  K 3.6 3.8 3.9 3.7  CL 103 102 99 98  CO2  --  26 25 26   GLUCOSE 116* 141* 150* 122*  BUN 24* 25* 31* 40*  CREATININE 1.70* 1.69* 2.05* 2.09*  CALCIUM  --  8.9 9.6 9.6     Plan  Avoid nephrotoxins    6. Dementia Ottis Stain  Per Primary team   Today's Summary: Mr. Lovings from a pulmonary standpoint can be taken to the OR for repair of his hip. His diaphragm dysfunction may make postoperative extubation difficult. Thoracentesis could be performed following the ORIF while the patient is still sedated.  [facility] S-ACNP    Reviewed above, examined pt, and discussed plan with Dr. Jerral Ralph and pt's son.  Pulmonary status okay to proceed with surgery.  Can do thoracentesis in OR with IR, or can do thoracentesis after surgery.  Don't think his pleural effusion is causing significant pulmonary compromise, and he is maintaining his oxygenation of current support.  PCCM will follow up  after surgery.  Coralyn Helling, MD [facility] Pulmonary/Critical Care 08/28/2012, 11:06 AM Pager:  [phone] After 3pm call: [phone]

## 2012-08-28 NOTE — Progress Notes (Addendum)
Patient: Wayne Montoya Date of Encounter: 08/28/2012, 7:06 AM Admit date: 08/26/2012     Subjective  Mr. Rajan has no new complaints. He denies CP, SOB or palpitations.   Objective  Physical Exam: Vitals: BP 151/70  Pulse 93  Temp(Src) 98.6 F (37 C) (Oral)  Resp 19  Ht 5\' 10"  (1.778 m)  Wt 204 lb 9.4 oz (92.8 kg)  BMI 29.36 kg/m2  SpO2 99% General: Well developed, elderly 79 year old male in no acute distress. Neck: Supple. Cervical collar in place. Cannot assess JVD. Lungs: Shallow inspiration. Diminished breath sounds, L>R. No wheezes. Breathing is unlabored. Heart: Irregular S1 S2 without murmur, rub or gallop.  Abdomen: Soft, non-distended. Extremities: No clubbing or cyanosis. No edema.    Intake/Output:  Intake/Output Summary (Last 24 hours) at 08/28/12 0706 Last data filed at 08/28/12 0600  Gross per 24 hour  Intake 293.25 ml  Output   1300 ml  Net -1006.75 ml    Inpatient Medications:  . atorvastatin  80 mg Oral Daily  . hydrALAZINE  25 mg Oral TID  . insulin aspart  0-20 Units Subcutaneous Q4H  . isosorbide mononitrate  30 mg Oral Daily  . metoprolol succinate  25 mg Oral BID  . pantoprazole  40 mg Oral Daily   . sodium chloride 10 mL/hr (08/27/12 1346)  . argatroban 0.4 mcg/kg/min (08/28/12 0545)    Labs:  Recent Labs  08/27/12 0500 08/28/12 0410  NA 135 137  K 3.9 3.7  CL 99 98  CO2 25 26  GLUCOSE 150* 122*  BUN 31* 40*  CREATININE 2.05* 2.09*  CALCIUM 9.6 9.6    Recent Labs  08/26/12 1729 08/27/12 0500 08/28/12 0410  AST 15  --  17  ALT 9  --  10  ALKPHOS 63  --  65  BILITOT 0.8  --  1.2  PROT 6.3 6.7 6.9  ALBUMIN 2.8* 3.0* 3.1*    Recent Labs  08/27/12 0500 08/28/12 0410  WBC 11.6* 11.3*  HGB 12.3* 11.8*  HCT 38.7* 37.2*  MCV 82.3 83.4  PLT 174 185    Recent Labs  08/26/12 0511  INR 1.59*    Radiology/Studies: Dg Chest 1 View  08/26/2012   *RADIOLOGY REPORT*  Clinical Data: Right hip fracture.  CHEST - 1  VIEW  Comparison: 08/16/2012  Findings: Backboard artifact.  Stable appearance of postoperative changes in the mediastinum.  Cardiac enlargement with pulmonary vascular congestion.  Left pleural effusion is increasing in size, possibly due to the supine technique.  Atelectasis or consolidation in the left lung base.  IMPRESSION: Cardiac enlargement with left pleural effusion and atelectasis or infiltration in the left lung base.   Original Report Authenticated By: Burman Nieves, M.D.   Ct Chest Wo Contrast  08/26/2012   *RADIOLOGY REPORT*  Clinical Data: Evaluate for pleural effusion.  Fall with cervical spine and hip fractures.  CT CHEST WITHOUT CONTRAST  Technique:  Multidetector CT imaging of the chest was performed following the standard protocol without IV contrast.  Comparison: 06/27/2012.  Findings:  THORACIC INLET/BODY WALL:  Gynecomastia. Small thyroid nodules, considered incidental.  MEDIASTINUM:  Chronic cardiomegaly.  No pericardial effusion.  Extensive coronary atherosclerotic calcification, status post CABG.  Diffuse aortic and great vessel atherosclerotic calcification, without evidence of acute abnormality.  There mildly enlarged lymph nodes, most notably in the AP window, lower paratracheal, subcarinal stations.  These are largely stable from prior and likely reactive.  LUNG WINDOWS:  Water  density left pleural effusion which is mainly layering. Effusion is moderate in volume, although larger than previous. Compressive atelectasis of the left lower lobe and lingula.  No consolidation or edema detected.  Mild dependent atelectasis on the right.  UPPER ABDOMEN:  Cholecystectomy.  OSSEOUS:  No acute fracture.  Advanced left glenohumeral osteoarthritis with loose bodies.  IMPRESSION:   Moderate, simple left pleural effusion, mildly larger than 06/27/2012. There is compressive atelectasis of the left lower lobe and lingula.   Original Report Authenticated By: Tiburcio Pea    Telemetry: AF, rate  controlled    Assessment and Plan  1. Chronic systolic HF  - Continue current regimen 2. Atrial fibrillation  - Rate controlled, asymptomatic  - Off warfarin currently due to surgery  - Continue current regimen 3. Recurrent left pleural effusion  - Unable to do thoracentesis yesterday  - Per notes, plan to do with anesthesia 4. CAD 5. Fall with right hip fracture s/p ORIF   Signed, EDMISTEN, BROOKE PA-C  Cardiology Attending  Patient seen and examined. Agree with above. Note unable to perform thoracentesis due to compliance/agitation and plan for anesthesia help. From cardiac perspective, once pleural fluid is drained, may proceed with hip surgery. He is at moderate cardiac risk.   Leonia Reeves.D.

## 2012-08-28 NOTE — Progress Notes (Signed)
ANTICOAGULATION CONSULT NOTE Pharmacy Consult for Argatroban Indication: h/o Afib  Allergies  Allergen Reactions  . Hydralazine Hcl Other (See Comments)    Dizziness  . Heparin Other (See Comments)    HIT per [facility] MAR  . Moxifloxacin Other (See Comments)    Per [facility] MAR  . Penicillins Other (See Comments)    Per [facility]  MAR    Patient Measurements: Height: 5\' 10"  (177.8 cm) Weight: 204 lb 9.4 oz (92.8 kg) (bed scale) IBW/kg (Calculated) : 73   Vital Signs: Temp: 98.2 F (36.8 C) (07/15 0800) Temp src: Oral (07/15 0800) BP: 164/89 mmHg (07/15 0924) Pulse Rate: 102 (07/15 0924)  Labs:  Recent Labs  08/26/12 0511  08/26/12 0514  08/26/12 1729 08/27/12 0500  08/27/12 2129 08/28/12 0410 08/28/12 0945  HGB 13.7  --  15.3  --   --  12.3*  --   --  11.8*  --   HCT 41.7  --  45.0  --   --  38.7*  --   --  37.2*  --   PLT 191  --   --   --   --  174  --   --  185  --   APTT  --   --   --   < > 88*  --   < > 122* 101* 98*  LABPROT 18.5*  --   --   --   --   --   --   --   --   --   INR 1.59*  --   --   --   --   --   --   --   --   --   CREATININE  --   < > 1.70*  --  1.69* 2.05*  --   --  2.09*  --   < > = values in this interval not displayed.  Estimated Creatinine Clearance: 28 ml/min (by C-G formula based on Cr of 2.09).  Assessment: 79 YO Male with h/o of AFib, Coumadin on hold for thoracentesis and hip surgery now on Argatroban at 0.4 mcg/kg/min. An aPTT is 98 after decrease to 0.78mcg/kg/min. Patient to go for thoracentesis under general anesthesia.   Goal of Therapy:  aPTT 50-90 seconds Monitor platelets by anticoagulation protocol: Yes   Plan:  -Decrease Argatroban to 0.3 mcg/kg/min (~ 20%) -Check PTT in 4 hrs  Harland German, Pharm D 08/28/2012 11:00 AM

## 2012-08-28 NOTE — Progress Notes (Signed)
Patient ID: Wayne Montoya, male   DOB: October 18, 1922, 79 y.o.   MRN: [id number]  Still awaiting clearance from pulmonary - pending thoracentesis.  Please call me [phone] once cleared so I can find OR time to fix hip  Thanks

## 2012-08-28 NOTE — Progress Notes (Signed)
Patient ID: Wayne Montoya, male   DOB: May 01, 1922, 79 y.o.   MRN: [id number] Patient well known to me. Had C2 fracture about 5 years ago. Felt surgery was not indicated in light of his age. Now he has fallen and broken his hip. Scheduled for surgery for fixation. Have reviewed the CT of his neck,. There is not canal compromise at C2. There is space between the C2 and odontoid fragment c/w non healing.Marland Kitchenas expected. I have had a long discussion with his son. It is still felt that fixing the C2 fracture is not reasonable. He has done fine in his collar for 5 years now, though he actually stopped wearing it a while back due to intolerance. Further treatment should still consist of wearing his collar as much as possible. Can intubate with glide scope when hip surgery occurs with minimal risk. Recontact if needed.

## 2012-08-28 NOTE — Progress Notes (Signed)
SLP Cancellation Note  Patient Details Name: Wayne Montoya MRN: [id number] DOB: [birth year]   Cancelled treatment:        Attempted to see pt. For swallow evaluation.  Currently pt. Is lethargic and unable to awaken secondary to having ativan.  Also, question how high HOB may be elevated for po"s, with unrepaired hip fracture.  MD please advise.  SLP will return 7/16.   Maryjo Rochester T 08/28/2012, 1:37 PM

## 2012-08-29 ENCOUNTER — Inpatient Hospital Stay (HOSPITAL_COMMUNITY): Payer: Medicare Other

## 2012-08-29 ENCOUNTER — Encounter (HOSPITAL_COMMUNITY)
Admission: EM | Disposition: A | Discharge status: Skilled Nursing Facility | Payer: Self-pay | Source: Home / Self Care | Attending: Internal Medicine

## 2012-08-29 ENCOUNTER — Inpatient Hospital Stay (HOSPITAL_COMMUNITY): Payer: Medicare Other | Admitting: Anesthesiology

## 2012-08-29 ENCOUNTER — Encounter (HOSPITAL_COMMUNITY): Payer: Self-pay | Admitting: Certified Registered Nurse Anesthetist

## 2012-08-29 ENCOUNTER — Encounter (HOSPITAL_COMMUNITY): Payer: Self-pay | Admitting: Anesthesiology

## 2012-08-29 DIAGNOSIS — J95821 Acute postprocedural respiratory failure: Secondary | ICD-10-CM

## 2012-08-29 DIAGNOSIS — I4891 Unspecified atrial fibrillation: Secondary | ICD-10-CM

## 2012-08-29 DIAGNOSIS — I5023 Acute on chronic systolic (congestive) heart failure: Secondary | ICD-10-CM

## 2012-08-29 HISTORY — PX: INTRAMEDULLARY (IM) NAIL INTERTROCHANTERIC: SHX5875

## 2012-08-29 LAB — BASIC METABOLIC PANEL
BUN: 45 mg/dL — ABNORMAL HIGH (ref 6–23)
Calcium: 9.5 mg/dL (ref 8.4–10.5)
Creatinine, Ser: 1.67 mg/dL — ABNORMAL HIGH (ref 0.50–1.35)
GFR calc non Af Amer: 35 mL/min — ABNORMAL LOW (ref >90)
Glucose, Bld: 158 mg/dL — ABNORMAL HIGH (ref 70–99)
Sodium: 140 mEq/L (ref 135–145)

## 2012-08-29 LAB — CBC
HCT: 34.3 % — ABNORMAL LOW (ref 39.0–52.0)
MCH: 27.3 pg (ref 26.0–34.0)
MCHC: 33.2 g/dL (ref 30.0–36.0)
MCV: 82.1 fL (ref 78.0–100.0)
RDW: 16.2 % — ABNORMAL HIGH (ref 11.5–15.5)

## 2012-08-29 LAB — POCT I-STAT 3, ART BLOOD GAS (G3+)
Bicarbonate: 27.4 mEq/L — ABNORMAL HIGH (ref 20.0–24.0)
pCO2 arterial: 52.2 mmHg — ABNORMAL HIGH (ref 35.0–45.0)
pH, Arterial: 7.327 — ABNORMAL LOW (ref 7.350–7.450)
pO2, Arterial: 122 mmHg — ABNORMAL HIGH (ref 80.0–100.0)

## 2012-08-29 LAB — GLUCOSE, CAPILLARY
Glucose-Capillary: 130 mg/dL — ABNORMAL HIGH (ref 70–99)
Glucose-Capillary: 166 mg/dL — ABNORMAL HIGH (ref 70–99)
Glucose-Capillary: 162 mg/dL — ABNORMAL HIGH (ref 70–99)
Glucose-Capillary: 162 mg/dL — ABNORMAL HIGH (ref 70–99)

## 2012-08-29 SURGERY — FIXATION, FRACTURE, INTERTROCHANTERIC, WITH INTRAMEDULLARY ROD
Anesthesia: General | Site: Hip | Laterality: Right | Wound class: Clean

## 2012-08-29 MED ORDER — SODIUM CHLORIDE 0.9 % IV SOLN
INTRAVENOUS | Status: DC
  Administered 2012-08-29 – 2012-08-30 (×2): via INTRAVENOUS

## 2012-08-29 MED ORDER — ONDANSETRON HCL 4 MG/2ML IJ SOLN: 4.0000 mg | Freq: Four times a day (QID) | INTRAMUSCULAR | Status: DC | PRN

## 2012-08-29 MED ORDER — CHLORHEXIDINE GLUCONATE 0.12 % MT SOLN
OROMUCOSAL | Status: AC
  Filled 2012-08-29: qty 15

## 2012-08-29 MED ORDER — CHLORHEXIDINE GLUCONATE 0.12 % MT SOLN
15.0000 mL | Freq: Two times a day (BID) | OROMUCOSAL | Status: DC
  Administered 2012-08-29 – 2012-08-30 (×2): 15 mL via OROMUCOSAL
  Filled 2012-08-29: qty 15

## 2012-08-29 MED ORDER — PROPOFOL 10 MG/ML IV EMUL
5.0000 ug/kg/min | INTRAVENOUS | Status: DC
  Administered 2012-08-29: 25 ug/kg/min via INTRAVENOUS
  Filled 2012-08-29 (×2): qty 100

## 2012-08-29 MED ORDER — METOPROLOL TARTRATE 1 MG/ML IV SOLN
INTRAVENOUS | Status: DC | PRN
  Administered 2012-08-29: 1 mg via INTRAVENOUS

## 2012-08-29 MED ORDER — METOCLOPRAMIDE HCL 5 MG PO TABS
5.0000 mg | ORAL_TABLET | Freq: Three times a day (TID) | ORAL | Status: DC | PRN
  Filled 2012-08-29: qty 2

## 2012-08-29 MED ORDER — WARFARIN SODIUM 5 MG PO TABS: 5.0000 mg | ORAL_TABLET | Freq: Every day | ORAL | Status: DC

## 2012-08-29 MED ORDER — ACETAMINOPHEN 650 MG RE SUPP: 650.0000 mg | Freq: Four times a day (QID) | RECTAL | Status: DC | PRN

## 2012-08-29 MED ORDER — CLINDAMYCIN PHOSPHATE 300 MG/2ML IJ SOLN
900.0000 mg | INTRAMUSCULAR | Status: DC
  Filled 2012-08-29: qty 6

## 2012-08-29 MED ORDER — METOPROLOL TARTRATE 1 MG/ML IV SOLN: 10.0000 mg | Freq: Four times a day (QID) | INTRAVENOUS | Status: DC

## 2012-08-29 MED ORDER — LABETALOL HCL 5 MG/ML IV SOLN
10.0000 mg | Freq: Four times a day (QID) | INTRAVENOUS | Status: DC
  Administered 2012-08-30 – 2012-08-31 (×5): 10 mg via INTRAVENOUS
  Filled 2012-08-29 (×10): qty 4

## 2012-08-29 MED ORDER — ACETAMINOPHEN 325 MG PO TABS
650.0000 mg | ORAL_TABLET | Freq: Four times a day (QID) | ORAL | Status: DC | PRN
  Administered 2012-08-31: 650 mg via ORAL
  Filled 2012-08-29: qty 2

## 2012-08-29 MED ORDER — PROPOFOL 10 MG/ML IV EMUL
5.0000 ug/kg/min | INTRAVENOUS | Status: AC
  Administered 2012-08-29: 25 ug/kg/min via INTRAVENOUS
  Filled 2012-08-29: qty 100

## 2012-08-29 MED ORDER — MENTHOL 3 MG MT LOZG
1.0000 | LOZENGE | OROMUCOSAL | Status: DC | PRN
  Filled 2012-08-29: qty 9

## 2012-08-29 MED ORDER — HYDRALAZINE HCL 20 MG/ML IJ SOLN
10.0000 mg | Freq: Four times a day (QID) | INTRAMUSCULAR | Status: DC
  Administered 2012-08-29 – 2012-08-31 (×2): 10 mg via INTRAVENOUS
  Filled 2012-08-29 (×3): qty 0.5
  Filled 2012-08-29 (×2): qty 1
  Filled 2012-08-29 (×3): qty 0.5
  Filled 2012-08-29: qty 1
  Filled 2012-08-29 (×2): qty 0.5

## 2012-08-29 MED ORDER — PROPOFOL 10 MG/ML IV BOLUS
INTRAVENOUS | Status: DC | PRN
  Administered 2012-08-29 (×2): 30 mg via INTRAVENOUS

## 2012-08-29 MED ORDER — HYDRALAZINE HCL 20 MG/ML IJ SOLN
10.0000 mg | INTRAMUSCULAR | Status: DC | PRN
  Administered 2012-08-29: 10 mg via INTRAVENOUS
  Filled 2012-08-29: qty 1

## 2012-08-29 MED ORDER — NITROGLYCERIN 0.2 MG/HR TD PT24
0.2000 mg | MEDICATED_PATCH | Freq: Every day | TRANSDERMAL | Status: DC
  Administered 2012-08-29 – 2012-09-05 (×8): 0.2 mg via TRANSDERMAL
  Filled 2012-08-29 (×8): qty 1

## 2012-08-29 MED ORDER — HYDRALAZINE HCL 20 MG/ML IJ SOLN
10.0000 mg | Freq: Once | INTRAMUSCULAR | Status: AC
  Administered 2012-08-29: 10 mg via INTRAVENOUS
  Filled 2012-08-29: qty 1

## 2012-08-29 MED ORDER — METOPROLOL TARTRATE 1 MG/ML IV SOLN
5.0000 mg | Freq: Four times a day (QID) | INTRAVENOUS | Status: DC
  Administered 2012-08-29: 5 mg via INTRAVENOUS
  Filled 2012-08-29: qty 5

## 2012-08-29 MED ORDER — 0.9 % SODIUM CHLORIDE (POUR BTL) OPTIME
TOPICAL | Status: DC | PRN
  Administered 2012-08-29: 1000 mL

## 2012-08-29 MED ORDER — LIDOCAINE HCL (CARDIAC) 20 MG/ML IV SOLN
INTRAVENOUS | Status: DC | PRN
  Administered 2012-08-29: 60 mg via INTRAVENOUS

## 2012-08-29 MED ORDER — HYDROCODONE-ACETAMINOPHEN 5-325 MG PO TABS: 1.0000 | ORAL_TABLET | Freq: Four times a day (QID) | ORAL | Status: DC | PRN

## 2012-08-29 MED ORDER — PHENOL 1.4 % MT LIQD
1.0000 | OROMUCOSAL | Status: DC | PRN
  Filled 2012-08-29: qty 177

## 2012-08-29 MED ORDER — ROCURONIUM BROMIDE 100 MG/10ML IV SOLN
INTRAVENOUS | Status: DC | PRN
  Administered 2012-08-29 (×2): 50 mg via INTRAVENOUS

## 2012-08-29 MED ORDER — BIOTENE DRY MOUTH MT LIQD
15.0000 mL | Freq: Four times a day (QID) | OROMUCOSAL | Status: DC
  Administered 2012-08-30 (×4): 15 mL via OROMUCOSAL

## 2012-08-29 MED ORDER — MORPHINE SULFATE 2 MG/ML IJ SOLN
0.5000 mg | INTRAMUSCULAR | Status: DC | PRN
  Administered 2012-08-30 – 2012-08-31 (×2): 0.5 mg via INTRAVENOUS
  Filled 2012-08-29 (×2): qty 1

## 2012-08-29 MED ORDER — METOCLOPRAMIDE HCL 5 MG/ML IJ SOLN
5.0000 mg | Freq: Three times a day (TID) | INTRAMUSCULAR | Status: DC | PRN
  Filled 2012-08-29: qty 2

## 2012-08-29 MED ORDER — ONDANSETRON HCL 4 MG PO TABS: 4.0000 mg | ORAL_TABLET | Freq: Four times a day (QID) | ORAL | Status: DC | PRN

## 2012-08-29 MED ORDER — LACTATED RINGERS IV SOLN
INTRAVENOUS | Status: DC | PRN
  Administered 2012-08-29: 18:00:00 via INTRAVENOUS

## 2012-08-29 MED ORDER — CLINDAMYCIN PHOSPHATE 900 MG/50ML IV SOLN
INTRAVENOUS | Status: DC | PRN
  Administered 2012-08-29: 900 mg via INTRAVENOUS

## 2012-08-29 MED ORDER — HYDROCODONE-ACETAMINOPHEN 5-325 MG PO TABS
1.0000 | ORAL_TABLET | Freq: Four times a day (QID) | ORAL | Status: DC | PRN
  Administered 2012-09-01 – 2012-09-04 (×9): 1 via ORAL
  Administered 2012-09-05: 2 via ORAL
  Filled 2012-08-29 (×6): qty 1
  Filled 2012-08-29: qty 2
  Filled 2012-08-29 (×3): qty 1

## 2012-08-29 SURGICAL SUPPLY — 40 items
APL SKNCLS STERI-STRIP NONHPOA (GAUZE/BANDAGES/DRESSINGS)
BENZOIN TINCTURE PRP APPL 2/3 (GAUZE/BANDAGES/DRESSINGS) ×1 IMPLANT
BIT DRILL CANN LG 4.3MM (BIT) IMPLANT
BOOTCOVER CLEANROOM LRG (PROTECTIVE WEAR) ×3 IMPLANT
CLOTH BEACON ORANGE TIMEOUT ST (SAFETY) ×2 IMPLANT
COVER SURGICAL LIGHT HANDLE (MISCELLANEOUS) ×2 IMPLANT
DRAPE STERI IOBAN 125X83 (DRAPES) ×2 IMPLANT
DRILL BIT CANN LG 4.3MM (BIT) ×2
DRSG MEPILEX BORDER 4X12 (GAUZE/BANDAGES/DRESSINGS) ×1 IMPLANT
DRSG MEPILEX BORDER 4X4 (GAUZE/BANDAGES/DRESSINGS) ×1 IMPLANT
DRSG MEPILEX BORDER 4X8 (GAUZE/BANDAGES/DRESSINGS) ×2 IMPLANT
DURAPREP 26ML APPLICATOR (WOUND CARE) ×2 IMPLANT
ELECT CAUTERY BLADE 6.4 (BLADE) ×2 IMPLANT
ELECT REM PT RETURN 9FT ADLT (ELECTROSURGICAL) ×2
ELECTRODE REM PT RTRN 9FT ADLT (ELECTROSURGICAL) ×1 IMPLANT
EVACUATOR 1/8 PVC DRAIN (DRAIN) IMPLANT
FACESHIELD LNG OPTICON STERILE (SAFETY) ×2 IMPLANT
GAUZE XEROFORM 5X9 LF (GAUZE/BANDAGES/DRESSINGS) ×1 IMPLANT
GUIDEPIN 3.2X17.5 THRD DISP (PIN) ×2 IMPLANT
GUIDEWIRE BALL NOSE 80CM (WIRE) ×1 IMPLANT
HFN LAG SCREW 10.5MM X 115MM (Orthopedic Implant) ×1 IMPLANT
KIT ROOM TURNOVER OR (KITS) ×2 IMPLANT
MANIFOLD NEPTUNE II (INSTRUMENTS) ×1 IMPLANT
NAIL HIP FRACT 130D 11X180 (Screw) ×1 IMPLANT
NS IRRIG 1000ML POUR BTL (IV SOLUTION) ×2 IMPLANT
PACK GENERAL/GYN (CUSTOM PROCEDURE TRAY) ×2 IMPLANT
PAD ARMBOARD 7.5X6 YLW CONV (MISCELLANEOUS) ×5 IMPLANT
SCREW BONE CORTICAL 5.0X40 (Screw) ×1 IMPLANT
STAPLER VISISTAT 35W (STAPLE) ×2 IMPLANT
STRIP CLOSURE SKIN 1/2X4 (GAUZE/BANDAGES/DRESSINGS) ×1 IMPLANT
SUT VIC AB 0 CTB1 27 (SUTURE) ×2 IMPLANT
SUT VIC AB 1 CT1 27 (SUTURE) ×2
SUT VIC AB 1 CT1 27XBRD ANBCTR (SUTURE) IMPLANT
SUT VIC AB 2-0 FS1 27 (SUTURE) ×1 IMPLANT
SUT VIC AB 2-0 SH 27 (SUTURE) ×2
SUT VIC AB 2-0 SH 27XBRD (SUTURE) IMPLANT
SUT VIC AB 3-0 SH 8-18 (SUTURE) ×1 IMPLANT
TOWEL OR 17X24 6PK STRL BLUE (TOWEL DISPOSABLE) ×2 IMPLANT
TOWEL OR 17X26 10 PK STRL BLUE (TOWEL DISPOSABLE) ×2 IMPLANT
WATER STERILE IRR 1000ML POUR (IV SOLUTION) ×1 IMPLANT

## 2012-08-29 NOTE — Transfer of Care (Signed)
Immediate Anesthesia Transfer of Care Note  Patient: Wayne Montoya  Procedure(s) Performed: Procedure(s): INTRAMEDULLARY (IM) NAIL INTERTROCHANTRIC Right (Right)  Patient Location: ICU  Anesthesia Type:General  Level of Consciousness: sedated and Patient remains intubated per anesthesia plan  Airway & Oxygen Therapy: Patient remains intubated per anesthesia plan and Patient placed on Ventilator (see vital sign flow sheet for setting)  Post-op Assessment: Post -op Vital signs reviewed and stable and Report to 2900 RN, Delice Bison  Post vital signs: Reviewed and stable  Complications: No apparent anesthesia complications

## 2012-08-29 NOTE — Brief Op Note (Signed)
08/26/2012 - 08/29/2012  8:01 PM  PATIENT:  Wayne Montoya  79 y.o. male  PRE-OPERATIVE DIAGNOSIS:  Right closed comminuted intertrochanteric femur fracture  POST-OPERATIVE DIAGNOSIS:  Right closed comminuted intertrochanteric femur fracture  PROCEDURE:  Procedure(s): INTRAMEDULLARY (IM) NAIL INTERTROCHANTRIC Right (Right)  SURGEON:  Surgeon(s) and Role:    * Shelda Pal, MD - Primary  PHYSICIAN ASSISTANT: None  ANESTHESIA:   general  EBL:     BLOOD ADMINISTERED:none  DRAINS: none   LOCAL MEDICATIONS USED:  NONE  SPECIMEN:  No Specimen  DISPOSITION OF SPECIMEN:  N/A  COUNTS:  YES  TOURNIQUET:  * No tourniquets in log *  DICTATION: .Other Dictation: Dictation Number [id number]  PLAN OF CARE: Admit to inpatient   PATIENT DISPOSITION:  ICU - intubated and hemodynamically stable.   Delay start of Pharmacological VTE agent (>24hrs) due to surgical blood loss or risk of bleeding: no

## 2012-08-29 NOTE — Progress Notes (Signed)
Patient ID: Wayne Montoya, male   DOB: 01/12/1923, 79 y.o.   MRN: [id number]  Has been cleared for surgery, spoke to Dr. Johnston Ebbs re medical co-morbidities and issues surrounding these as they pertain to his right hip fracture  Reviewed consult notes  Plan is to go to OR tonight for ORIF right hip fracture NPO Consent ordered Hold all anticoagulants

## 2012-08-29 NOTE — Progress Notes (Signed)
SLP Cancellation Note  Patient Details Name: COBIE MARCOUX MRN: [id number] DOB: Aug 25, 1922   Cancelled treatment:        Second attempt to complete swallow evaluation.  Patient is now NPO for surgery for hip fracture.  Surgery at 5:30.  Advised RN to call MD re: po meds.  ? If they may be changed to IV.  SLP will re-attempt bedside swallow evaluation 7/17.   Maryjo Rochester T 08/29/2012, 9:47 AM

## 2012-08-29 NOTE — Progress Notes (Signed)
[facility] Progress Note [place] TEAM 1 - Stepdown/ICU TEAM   Wayne Montoya XBM:[id number] DOB: [birth year] DOA: 08/26/2012 PCP: Wilmer Floor., MD  Brief narrative: 79 year old male patient with multiple medical problems. Recently discharged last month to an assisted living facility after developing deconditioning during treatment for left pleural effusion related to chronic heart failure as well as treatment for healthcare acquired pneumonia. He was sent back to the emergency department after tripping and falling into his shower. Patient is noted to have an old C2 fracture in 2011 but this was not surgically repaired. Evaluation in the ER this admission showed a C2 subluxation on top of the old C2 fracture (unclear if acute or chronic), recurrence of his left pleural effusion, and an acute right hip fracture comminuted in nature.  Since admission patient has undergone preoperative evaluation by Cardiology who felt from a congestive heart failure standpoint the patient was compensated and appropriate for surgery placing him at a moderate risk for cardiovascular complications. Neurosurgery evaluated the patient because of the new subluxation at the C2 level and the recommendations were to place a hard cervical collar and to minimize any manipulation of the neck during intubation. Please see the notes regarding discussion of risks and benefits with the patient and his family.   Assessment/Plan:  Acute respiratory failure with hypoxia due to Pleural effusion, left (recurrent) -on [place] oxygen -Pulmonary Medicine following -IR could not perform thoracentesis - Pulmonary felt patient stable enough to proceed with ORIF prior to thoracentesis and if clinically needed post op can perform thoracentesis  -Lasix was dc'd since pt clinically dry/NPO  C2 cervical fracture (old) with subluxation  -NS following -no indication clinically to repair old fx -cont hard cervical collar -rec OR  intubation utilizing Glidescope noting increased risk of spinal cord injury resulting in quadraplegia  Intertrochanteric fracture of right hip -Ortho following (Dr Charlann Boxer) -plan operative repair 7/16 pm -since NPO will start IVF at 50/hr and change needed meds to IV route   DIABETES MELLITUS, TYPE II -CBG controlled -cont SSI  Atrial fibrillation / Heparin induced thrombocytopenia (HIT) -rate controlled -on chronic coumadin but has known HIT so bridging with Argatroban  CKD (chronic kidney disease) stage 3, GFR 30-59 ml/min -stable  HYPERTENSION -BP poorly controlled - meds adjusted - follow trend  CAD, Coronary ARTERY BYPASS GRAFT -no sx's -moderate CV surgical risk per Cards evaluation -cont BB  Chronic systolic heart failure/EF 30% -compensated -home Lasix on hold  Right ventricular dilation -as above -avoid over diuresis   DVT prophylaxis: Argatroban Code Status: DO NOT RESUSCITATE Family Communication: Patient sleeping - no family present at time of visit  Disposition Plan: SDU   Consultants: Cardiology Neurosurgery Orthopedics Pulmonary Medicine  Procedures: Thoracentesis - pending  Antibiotics: None  HPI/Subjective: Patient sleeping/sedated with pain meds.  Objective: Blood pressure 190/71, pulse 86, temperature 98.6 F (37 C), temperature source Oral, resp. rate 22, height 5\' 10"  (1.778 m), weight 92.8 kg (204 lb 9.4 oz), SpO2 99.00%.  Intake/Output Summary (Last 24 hours) at 08/29/12 1222 Last data filed at 08/29/12 1200  Gross per 24 hour  Intake 325.14 ml  Output   1580 ml  Net -1254.86 ml    Exam: General: No acute respiratory distress Lungs: Coarse to auscultation bilaterally without wheezes or crackles but very diminished on left throughout entire lung, 2 L Cardiovascular: Irregular rate and rhythm without murmur gallop or rub normal S1 and S2, no peripheral edema or JVD Abdomen: Nontender, nondistended, soft, bowel sounds  positive,  no rebound, no ascites, no appreciable mass Musculoskeletal: No significant cyanosis, clubbing of bilateral lower extremities Neurological: Hard cervical collar intact  Scheduled Meds: Scheduled Meds: . insulin aspart  0-20 Units Subcutaneous Q4H  . metoprolol  5 mg Intravenous Q6H  . nitroGLYCERIN  0.2 mg Transdermal Daily   Continuous Infusions: . sodium chloride 50 mL/hr at 08/29/12 1053  . argatroban 0.25 mcg/kg/min (08/29/12 1053)    Data Reviewed: Basic Metabolic Panel:  Recent Labs Lab 08/26/12 0514 08/26/12 1729 08/27/12 0500 08/28/12 0410 08/29/12 0505  NA 141 137 135 137 140  K 3.6 3.8 3.9 3.7 3.7  CL 103 102 99 98 102  CO2  --  26 25 26 26   GLUCOSE 116* 141* 150* 122* 158*  BUN 24* 25* 31* 40* 45*  CREATININE 1.70* 1.69* 2.05* 2.09* 1.67*  CALCIUM  --  8.9 9.6 9.6 9.5   Liver Function Tests:  Recent Labs Lab 08/26/12 1729 08/27/12 0500 08/28/12 0410  AST 15  --  17  ALT 9  --  10  ALKPHOS 63  --  65  BILITOT 0.8  --  1.2  PROT 6.3 6.7 6.9  ALBUMIN 2.8* 3.0* 3.1*   CBC:  Recent Labs Lab 08/26/12 0511 08/26/12 0514 08/27/12 0500 08/28/12 0410 08/29/12 0505  WBC 8.8  --  11.6* 11.3* 10.1  HGB 13.7 15.3 12.3* 11.8* 11.4*  HCT 41.7 45.0 38.7* 37.2* 34.3*  MCV 81.9  --  82.3 83.4 82.1  PLT 191  --  174 185 147*   BNP (last 3 results)  Recent Labs  06/22/12 1907 08/26/12 1004  PROBNP 4692.0* 3413.0*   CBG:  Recent Labs Lab 08/28/12 1707 08/28/12 1955 08/29/12 0023 08/29/12 0345 08/29/12 0818  GLUCAP 140* 121* 112* 130* 166*    Recent Results (from the past 240 hour(s))  MRSA PCR SCREENING     Status: None   Collection Time    08/26/12  9:49 AM      Result Value Range Status   MRSA by PCR NEGATIVE  NEGATIVE Final   Comment:            The GeneXpert MRSA Assay (FDA     approved for NASAL specimens     only), is one component of a     comprehensive MRSA colonization     surveillance program. It is not     intended to  diagnose MRSA     infection nor to guide or     monitor treatment for     MRSA infections.     Studies:  Recent x-ray studies have been reviewed in detail by the Attending Physician    Junious Silk, ANP [facility] Office  [phone] Pager [phone]  **If unable to reach the above provider after paging please contact the Flow Manager @ [phone]  On-Call/Text Page:      [email or web address]      password [email or web address]  If 7PM-7AM, please contact night-coverage [email or web address] Password [email or web address] 08/29/2012, 12:22 PM   LOS: 3 days   I have personally examined this patient and reviewed the entire database. I have reviewed the above note, made any necessary editorial changes, and agree with its content.  Lonia Blood, MD [facility]

## 2012-08-29 NOTE — Progress Notes (Signed)
eLink Physician-Brief Progress Note Patient Name: Wayne Montoya DOB: December 01, 1922 MRN: [id number]  Date of Service  08/29/2012   HPI/Events of Note   Pt on vent post op hip fx  eICU Interventions  See orders for vent    Intervention Category Major Interventions: Respiratory failure - evaluation and management  Shan Levans 08/29/2012, 9:53 PM

## 2012-08-29 NOTE — Anesthesia Preprocedure Evaluation (Addendum)
Anesthesia Evaluation  Patient identified by MRN, date of birth, ID band Patient confused    Reviewed: Allergy & Precautions, H&P , NPO status , Patient's Chart, lab work & pertinent test results  Airway Mallampati: II TM Distance: >3 FB Neck ROM: Limited    Dental  (+) Edentulous Upper and Edentulous Lower   Pulmonary shortness of breath,  Pl effusion SaO2 92 on O2 + rhonchi   + decreased breath sounds  rales    Cardiovascular hypertension, Pt. on medications and Pt. on home beta blockers + CAD, + CABG, + Peripheral Vascular Disease and +CHF + dysrhythmias Atrial Fibrillation Rhythm:Irregular Rate:Tachycardia + Systolic murmurs    Neuro/Psych dementia TIACVA    GI/Hepatic   Endo/Other  diabetes, Type 2  Renal/GU Renal InsufficiencyRenal disease     Musculoskeletal   Abdominal (+) + obese,   Peds  Hematology   Anesthesia Other Findings Will not coop for airway exam Neck in collar with odontoid fx-probably recent movement  Reproductive/Obstetrics                          Anesthesia Physical Anesthesia Plan  ASA: IV  Anesthesia Plan: General   Post-op Pain Management:    Induction: Intravenous  Airway Management Planned: Oral ETT and Video Laryngoscope Planned  Additional Equipment:   Intra-op Plan:   Post-operative Plan: Post-operative intubation/ventilation  Informed Consent: I have reviewed the patients History and Physical, chart, labs and discussed the procedure including the risks, benefits and alternatives for the proposed anesthesia with the patient or authorized representative who has indicated his/her understanding and acceptance.     Plan Discussed with: CRNA, Surgeon and Anesthesiologist  Anesthesia Plan Comments:        Anesthesia Quick Evaluation

## 2012-08-29 NOTE — Anesthesia Procedure Notes (Signed)
Procedure Name: Intubation Date/Time: 08/29/2012 6:43 PM Performed by: Rogelia Boga Pre-anesthesia Checklist: Patient identified, Emergency Drugs available, Suction available, Patient being monitored and Timeout performed Patient Re-evaluated:Patient Re-evaluated prior to inductionOxygen Delivery Method: Circle system utilized Preoxygenation: Pre-oxygenation with 100% oxygen Intubation Type: IV induction Ventilation: Mask ventilation without difficulty Tube type: Subglottic suction tube Tube size: 8.0 mm Number of attempts: 1 Airway Equipment and Method: Video-laryngoscopy and Stylet Placement Confirmation: ETT inserted through vocal cords under direct vision,  positive ETCO2 and breath sounds checked- equal and bilateral Secured at: 22 cm Tube secured with: Tape Dental Injury: Teeth and Oropharynx as per pre-operative assessment  Difficulty Due To: Difficult Airway- due to reduced neck mobility, Difficult Airway- due to cervical collar and Difficulty was anticipated Comments: DL X1 with Glidescope, In line manual cervical stabilization By Dr. Gypsy Balsam, 8.0 subglotic ETT very easily placed, VSS.

## 2012-08-29 NOTE — Progress Notes (Signed)
ANTICOAGULATION CONSULT NOTE Pharmacy Consult for Argatroban Indication: h/o Afib  Allergies  Allergen Reactions  . Hydralazine Hcl Other (See Comments)    Dizziness  . Heparin Other (See Comments)    HIT per [facility]  . Moxifloxacin Other (See Comments)    Per [facility]  . Penicillins Other (See Comments)    Per [facility]  [facility]    Patient Measurements: Height: 5\' 10"  (177.8 cm) Weight: 204 lb 9.4 oz (92.8 kg) (bed scale) IBW/kg (Calculated) : 73   Vital Signs: Temp: 97.7 F (36.5 C) (07/16 0819) Temp src: Oral (07/16 0819) BP: 164/82 mmHg (07/16 0819) Pulse Rate: 86 (07/16 0341)  Labs:  Recent Labs  08/27/12 0500  08/28/12 0410  08/28/12 1435 08/28/12 1817 08/29/12 0505  HGB 12.3*  --  11.8*  --   --   --  11.4*  HCT 38.7*  --  37.2*  --   --   --  34.3*  PLT 174  --  185  --   --   --  147*  APTT  --   < > 101*  < > 77* 70* 92*  CREATININE 2.05*  --  2.09*  --   --   --  1.67*  < > = values in this interval not displayed.  Estimated Creatinine Clearance: 35 ml/min (by C-G formula based on Cr of 1.67).  Assessment: 79 YO Male with h/o of AFib, Coumadin on hold for thoracentesis and hip surgery now on Argatroban at 0.4 mcg/kg/min. The aPTT is slightly above goal at 92 on 0.96mcg/kg/min.  Plans for hip surgery today and possible thoracentesis   Goal of Therapy:  aPTT 50-90 seconds Monitor platelets by anticoagulation protocol: Yes   Plan:  -Decrease  Argatroban to 0.25 mcg/kg/min  -Argatroban to stop at 2:30pm today -Will not recheck an aPTT due to plans to stop argatroban later today -Will follow plans post surgery  Harland German, Pharm D 08/29/2012 10:42 AM

## 2012-08-29 NOTE — Progress Notes (Signed)
Patient: Wayne Montoya Date of Encounter: 08/29/2012, 7:35 AM Admit date: 08/26/2012     Subjective  Wayne Montoya states he is sleepy this AM but has no other complaints. He denies CP, SOB or palpitations.   Objective  Physical Exam: Vitals: BP 147/60  Pulse 86  Temp(Src) 97.4 F (36.3 C) (Oral)  Resp 19  Ht 5\' 10"  (1.778 m)  Wt 204 lb 9.4 oz (92.8 kg)  BMI 29.36 kg/m2  SpO2 97% General: Well developed, elderly 79 year old male in no acute distress. Neck: Supple. Cervical collar in place. Difficult to assess JVD. Lungs: Shallow inspiration. Diminished breath sounds, L>R. No wheezes. Breathing is unlabored. Heart: Irregular S1 S2 without murmur, rub or gallop.  Abdomen: Soft, non-distended. Extremities: No clubbing or cyanosis. No edema.    Intake/Output:  Intake/Output Summary (Last 24 hours) at 08/29/12 0735 Last data filed at 08/29/12 0600  Gross per 24 hour  Intake  272.3 ml  Output   1380 ml  Net -1107.7 ml   Inpatient Medications:  . atorvastatin  80 mg Oral Daily  . hydrALAZINE  25 mg Oral TID  . insulin aspart  0-20 Units Subcutaneous Q4H  . isosorbide mononitrate  30 mg Oral Daily  . metoprolol succinate  25 mg Oral BID  . pantoprazole  40 mg Oral Daily   . sodium chloride 10 mL/hr (08/27/12 1346)  . argatroban 0.305 mcg/kg/min (08/28/12 1200)   Labs:  Recent Labs  08/28/12 0410 08/29/12 0505  NA 137 140  K 3.7 3.7  CL 98 102  CO2 26 26  GLUCOSE 122* 158*  BUN 40* 45*  CREATININE 2.09* 1.67*  CALCIUM 9.6 9.5    Recent Labs  08/26/12 1729 08/27/12 0500 08/28/12 0410  AST 15  --  17  ALT 9  --  10  ALKPHOS 63  --  65  BILITOT 0.8  --  1.2  PROT 6.3 6.7 6.9  ALBUMIN 2.8* 3.0* 3.1*    Recent Labs  08/28/12 0410 08/29/12 0505  WBC 11.3* 10.1  HGB 11.8* 11.4*  HCT 37.2* 34.3*  MCV 83.4 82.1  PLT 185 147*    Radiology/Studies: Dg Chest 1 View  08/26/2012   *RADIOLOGY REPORT*  Clinical Data: Right hip fracture.  CHEST - 1 VIEW   Comparison: 08/16/2012  Findings: Backboard artifact.  Stable appearance of postoperative changes in the mediastinum.  Cardiac enlargement with pulmonary vascular congestion.  Left pleural effusion is increasing in size, possibly due to the supine technique.  Atelectasis or consolidation in the left lung base.  IMPRESSION: Cardiac enlargement with left pleural effusion and atelectasis or infiltration in the left lung base.   Original Report Authenticated By: Burman Nieves, M.D.   Ct Chest Wo Contrast  08/26/2012   *RADIOLOGY REPORT*  Clinical Data: Evaluate for pleural effusion.  Fall with cervical spine and hip fractures.  CT CHEST WITHOUT CONTRAST  Technique:  Multidetector CT imaging of the chest was performed following the standard protocol without IV contrast.  Comparison: 06/27/2012.  Findings:  THORACIC INLET/BODY WALL:  Gynecomastia. Small thyroid nodules, considered incidental.  MEDIASTINUM:  Chronic cardiomegaly.  No pericardial effusion.  Extensive coronary atherosclerotic calcification, status post CABG.  Diffuse aortic and great vessel atherosclerotic calcification, without evidence of acute abnormality.  There mildly enlarged lymph nodes, most notably in the AP window, lower paratracheal, subcarinal stations.  These are largely stable from prior and likely reactive.  LUNG WINDOWS:  Water density left pleural effusion  which is mainly layering. Effusion is moderate in volume, although larger than previous. Compressive atelectasis of the left lower lobe and lingula.  No consolidation or edema detected.  Mild dependent atelectasis on the right.  UPPER ABDOMEN:  Cholecystectomy.  OSSEOUS:  No acute fracture.  Advanced left glenohumeral osteoarthritis with loose bodies.  IMPRESSION:   Moderate, simple left pleural effusion, mildly larger than 06/27/2012. There is compressive atelectasis of the left lower lobe and lingula.   Original Report Authenticated By: Tiburcio Pea    Telemetry: AF, rate  controlled    Assessment and Plan  1. Chronic systolic HF  - Continue current regimen 2. Atrial fibrillation  - Rate controlled, asymptomatic  - Off warfarin currently  - Continue current regimen peri-operatively 3. Recurrent left pleural effusion  - Unable to do thoracentesis yesterday  - Per notes, plan to do with anesthesia 4. CAD 5. Fall with right hip fracture, scheduled for ORIF today  - Per [facility] MD note yesterday - From cardiac perspective, once pleural fluid is drained, may proceed with hip surgery. He is at moderate cardiac risk.   Will sign off. Please call back with any further questions. Signed, Exie Parody  EP attending  Patient seen and examined. Agree with above.  Leonia Reeves.D.

## 2012-08-29 NOTE — Progress Notes (Signed)
ANTICOAGULATION CONSULT NOTE - Follow Up Consult  Pharmacy Consult for Argatroban/Warfarin Indication: Atrial fibrillation, history of HIT  Allergies  Allergen Reactions  . Hydralazine Hcl Other (See Comments)    Dizziness. Pt has been tolerating IV hydralazine this admit.  . Heparin Other (See Comments)    HIT per [facility]  . Moxifloxacin Other (See Comments)    Per [facility]  . Penicillins Other (See Comments)    Per [facility]  MAR    Patient Measurements: Height: 5\' 10"  (177.8 cm) Weight: 204 lb 9.4 oz (92.8 kg) (bed scale) IBW/kg (Calculated) : 73  Vital Signs: Temp: 99.1 F (37.3 C) (07/16 1630) Temp src: Other (Comment) (07/16 1630) BP: 208/95 mmHg (07/16 1630) Pulse Rate: 82 (07/16 1500)  Labs:  Recent Labs  08/27/12 0500  08/28/12 0410  08/28/12 1435 08/28/12 1817 08/29/12 0505  HGB 12.3*  --  11.8*  --   --   --  11.4*  HCT 38.7*  --  37.2*  --   --   --  34.3*  PLT 174  --  185  --   --   --  147*  APTT  --   < > 101*  < > 77* 70* 92*  CREATININE 2.05*  --  2.09*  --   --   --  1.67*  < > = values in this interval not displayed.  Estimated Creatinine Clearance: 35 ml/min (by C-G formula based on Cr of 1.67).   Medications:  Infusions:  . sodium chloride 50 mL/hr at 08/29/12 1053  . sodium chloride      Assessment: 79 year old male s/p right hip IM nail this evening.  He has atrial fibrillation and a history of HIT.  His Coumadin was stopped 7/13 and he has been bridged with Argatroban pre-operatively.  Note he also may require thoracentesis in the near future per Pulmonary.  Goal of Therapy:  INR 2-3 aPTT 50-90 seconds Monitor platelets by anticoagulation protocol: Yes   Plan:  Spoke with Dr. Lowella Dandy - he would like to hold all anticoagulation until it can be readdressed on 7/17 with regards to restarting Coumadin (if no thoracentesis planned) vs. bridging with Argatroban (if going for thoracentesis, OK to restart 7/17 if needed).  Estella Husk,  Pharm.D., BCPS, AAHIVP Clinical Pharmacist Phone: [phone] or [phone] 08/29/2012, 9:19 PM

## 2012-08-29 NOTE — Preoperative (Signed)
Beta Blockers   Reason not to administer Beta Blockers:Hold beta blocker due to other, Beta blocker held, will give beta blocker intraop.

## 2012-08-29 NOTE — Clinical Social Work Note (Signed)
CSW updated son, at beside with responses from SNF. One SNF is considering at the moment. CSW will continue to follow.   Rozetta Nunnery MSW, [employer] [phone]

## 2012-08-30 DIAGNOSIS — R404 Transient alteration of awareness: Secondary | ICD-10-CM

## 2012-08-30 LAB — CBC
HCT: 33.9 % — ABNORMAL LOW (ref 39.0–52.0)
MCHC: 33 g/dL (ref 30.0–36.0)
MCV: 82.5 fL (ref 78.0–100.0)
Platelets: 177 10*3/uL (ref 150–400)
RDW: 16.3 % — ABNORMAL HIGH (ref 11.5–15.5)
WBC: 14.9 10*3/uL — ABNORMAL HIGH (ref 4.0–10.5)

## 2012-08-30 LAB — COMPREHENSIVE METABOLIC PANEL
Albumin: 2.3 g/dL — ABNORMAL LOW (ref 3.5–5.2)
BUN: 50 mg/dL — ABNORMAL HIGH (ref 6–23)
Calcium: 9.3 mg/dL (ref 8.4–10.5)
GFR calc Af Amer: 37 mL/min — ABNORMAL LOW (ref >90)
Glucose, Bld: 154 mg/dL — ABNORMAL HIGH (ref 70–99)
Total Protein: 6.3 g/dL (ref 6.0–8.3)

## 2012-08-30 LAB — PROTIME-INR: INR: 3.59 — ABNORMAL HIGH (ref 0.00–1.49)

## 2012-08-30 LAB — GLUCOSE, CAPILLARY: Glucose-Capillary: 147 mg/dL — ABNORMAL HIGH (ref 70–99)

## 2012-08-30 MED ORDER — WARFARIN SODIUM 5 MG PO TABS
5.0000 mg | ORAL_TABLET | Freq: Once | ORAL | Status: DC
  Filled 2012-08-30: qty 1

## 2012-08-30 MED ORDER — CHLORHEXIDINE GLUCONATE 0.12 % MT SOLN
15.0000 mL | Freq: Two times a day (BID) | OROMUCOSAL | Status: DC
  Administered 2012-08-30: 15 mL via OROMUCOSAL
  Filled 2012-08-30: qty 15

## 2012-08-30 MED ORDER — ARGATROBAN 50 MG/50ML IV SOLN
0.0700 ug/kg/min | INTRAVENOUS | Status: DC
  Administered 2012-08-30: 0.25 ug/kg/min via INTRAVENOUS
  Filled 2012-08-30: qty 50

## 2012-08-30 MED ORDER — BIOTENE DRY MOUTH MT LIQD
15.0000 mL | Freq: Two times a day (BID) | OROMUCOSAL | Status: DC
  Administered 2012-08-31 – 2012-09-03 (×7): 15 mL via OROMUCOSAL

## 2012-08-30 MED ORDER — WARFARIN - PHARMACIST DOSING INPATIENT: Freq: Every day | Status: DC

## 2012-08-30 NOTE — Progress Notes (Signed)
ANTICOAGULATION CONSULT NOTE - Follow Up Consult  Pharmacy Consult for argatroban Indication: atrial fibrillation  Labs:  Recent Labs  08/28/12 0410  08/29/12 0505 08/30/12 0545 08/30/12 1800 08/30/12 2215  HGB 11.8*  --  11.4* 11.2*  --   --   HCT 37.2*  --  34.3* 33.9*  --   --   PLT 185  --  147* 177  --   --   APTT 101*  < > 92* 60* 98* 102*  LABPROT  --   --   --  34.5*  --   --   INR  --   --   --  3.59*  --   --   CREATININE 2.09*  --  1.67* 1.80*  --   --   < > = values in this interval not displayed.   Assessment: 79yo male remains supratherapeutic on argatroban with increased PTT despite decreased rate.  Goal of Therapy:  aPTT 50-90 seconds   Plan:  Will decrease argatroban by 40% to 0.12 mcg/kg/min and check PTT in 2hr.  Vernard Gambles, PharmD, BCPS  08/30/2012,11:30 PM

## 2012-08-30 NOTE — Progress Notes (Signed)
Aspen collar placed per order per two RN's

## 2012-08-30 NOTE — Progress Notes (Addendum)
PULMONARY  / CRITICAL CARE MEDICINE  Name: Wayne Montoya MRN: [id number] DOB: 1923/02/09    ADMISSION DATE:  08/26/2012 CONSULTATION DATE:  7/13   REFERRING MD :  [facility]  PRIMARY SERVICE:  [facility]   CHIEF COMPLAINT:  Left Pleural effusion -recurrent   BRIEF PATIENT DESCRIPTION:  79yo male SNF resident with mult medical problems including CHF, AFib on coumadin, chronic renal insufficiency, hx CVA, chronic C2 fx  admitted by [facility] 7/13 for R. Hip Fx after fall . Found to have recurrent Left Pleural effusion .  PCCM asked for consult for preop clearance /pl effusion.  SIGNIFICANT EVENTS / STUDIES:  CT C Spine >Old appearing ununited fracture of the base of the odontoid process  of C2. There is posterior subluxation of the odontoid process with respect to the body of C2  7/13 CT chest > Moderate, simple left pleural effusion. Compression atelectasis of the LLL and lingula 7/16 R IM nail femur  LINES / TUBES: 7/16 ETT >>  CULTURES:   ANTIBIOTICS:  SUBJECTIVE:  OR today, returned to ICU intubated  VITAL SIGNS: Temp:  [97.4 F (36.3 C)-99.1 F (37.3 C)] 98.2 F (36.8 C) (07/16 2100) Pulse Rate:  [82-102] 91 (07/16 2338) Resp:  [11-31] 17 (07/16 2338) BP: (84-208)/(39-101) 147/58 mmHg (07/16 2338) SpO2:  [96 %-100 %] 99 % (07/16 2338) FiO2 (%):  [50 %-61.4 %] 60 % (07/16 2338)  PHYSICAL EXAMINATION: General:  Elderly WM  Comfortable on vent HEENT: NCAT, hard collar in place, ETT in place PULM: diminished on left, clear breath sounds on R CV: Irreg irreg, no mgr AB: BS+ infrequent, soft Ext: trace edema legs, warm, well perfused Neuro: Arouses to voice, comfortable on vent   Recent Labs Lab 08/27/12 0500 08/28/12 0410 08/29/12 0505  NA 135 137 140  K 3.9 3.7 3.7  CL 99 98 102  CO2 25 26 26   BUN 31* 40* 45*  CREATININE 2.05* 2.09* 1.67*  GLUCOSE 150* 122* 158*    Recent Labs Lab 08/27/12 0500 08/28/12 0410 08/29/12 0505  HGB 12.3* 11.8* 11.4*  HCT 38.7* 37.2*  34.3*  WBC 11.6* 11.3* 10.1  PLT 174 185 147*   Dg Hip Operative Right  08/29/2012   *RADIOLOGY REPORT*  Clinical Data: Right hip fracture.  DG OPERATIVE RIGHT HIP  Comparison: 08/26/2012 radiographs.  Findings: Three spot fluoroscopic images of the right hip demonstrate compression screw and intramedullary nail fixation of the intertrochanteric right femur fracture.  There is near anatomic reduction of the main fracture fragments.  Hardware appears well positioned.  There is no dislocation.  IMPRESSION: Improved alignment of the intertrochanteric right femur fracture status post ORIF.   Original Report Authenticated By: Carey Bullocks, M.D.   Dg Chest Port 1 View  08/29/2012   *RADIOLOGY REPORT*  Clinical Data: Intubated.  PORTABLE CHEST - 1 VIEW  Comparison: 08/28/2012  Findings: Endotracheal tube is 2.8 cm above the carina. Nasogastric tube is in the upper stomach region.  Left basilar densities are most compatible with pleural effusion and consolidation. Haziness at the right lung base may represent atelectasis.  Heart size appears to be upper limits of normal.  No evidence for a pneumothorax.  IMPRESSION: Support apparatuses as described.  Nasogastric tube in the upper stomach region.  Left basilar densities are suggestive for pleural fluid and consolidation.   Original Report Authenticated By: Richarda Overlie, M.D.   Dg Chest Port 1 View  08/28/2012   *RADIOLOGY REPORT*  Clinical Data: Follow up pleural effusion.  Shortness of breath.  PORTABLE CHEST - 1 VIEW  Comparison: Chest x-ray 08/26/2012.  Findings: No significant change in a large left-sided pleural effusion.  The majority of this pleural fluid is now throughout the mid and lower left hemithorax, with less fluid layering around the upper lung (this is likely positional as today's study is semi erect versus supine on the prior).  Extensive opacities throughout the left lung, particularly at the left base, likely reflect a combination of atelectasis  and/or consolidation.  Mild pulmonary venous congestion, without frank pulmonary edema.  Mild cardiomegaly is unchanged. The patient is rotated to the right on today's exam, resulting in distortion of the mediastinal contours and reduced diagnostic sensitivity and specificity for mediastinal pathology.  Atherosclerosis in the thoracic aorta.  Status post median sternotomy for CABG.  Surgical clips project over the right upper quadrant of the abdomen, likely from prior cholecystectomy. Advanced degenerative changes of osteoarthritis are incidentally noted in the left glenohumeral joint.  IMPRESSION: 1.  Allowing for differences in patient positioning, the radiographic appearance of chest is essentially unchanged, as detailed above.   Original Report Authenticated By: Trudie Reed, M.D.    ASSESSMENT / PLAN: 1. Recurrent Left Pleural Effusion ? Etiology  Prev admit 06/2012 with Left effusion s/p thoracentesis (950cc) felt possible parapneumonic +/- CHF (cytology neg ) . Re-occured on CXR 7/3 .  >  Post op respiratory failure > currently stable on vent  Plan  -full vent support -consider thoracentesis on 7/17 prior to extubation -daily WUA/SBT/CXR/ABG   2. R. Hip Fracture > s/p IM nailing Plan  Per Ortho   3.  Chronic C2 Fracture with new C2 subluxation - Pt to cont with C Collar .   4. CHF /Atrial Fib /CAD s/p CABG /HTN  recent echocardiogram completed July 2014 demonstrated a systolic function of 30-35% Coumadin at home  Plan  Cardiology following  Cont Diuresis  Keep Neg to even bal  Argatroban per cards (Hx of HIT )   5. DM  SSI  6. Acute on Chronic Renal insufficiency (baseline 1.4 7/3)   Recent Labs Lab 08/26/12 0514 08/26/12 1729 08/27/12 0500 08/28/12 0410 08/29/12 0505  NA 141 137 135 137 140  K 3.6 3.8 3.9 3.7 3.7  CL 103 102 99 98 102  CO2  --  26 25 26 26   GLUCOSE 116* 141* 150* 122* 158*  BUN 24* 25* 31* 40* 45*  CREATININE 1.70* 1.69* 2.05* 2.09* 1.67*   CALCIUM  --  8.9 9.6 9.6 9.5     Plan  Avoid nephrotoxins    6. Dementia Ottis Stain  Per Primary team   Today's Summary: post op respiratory failure (mostly left intubated because came out of OR late in day), stable on vent with mild resp acidosis; minimize sedation, allow to overbreath vent, repeat ABG in AM; consider thoracentesis in AM  CC time 30 minutes   Yolonda Kida PCCM Pager: [phone] Cell: [phone] If no response, call [phone]

## 2012-08-30 NOTE — Anesthesia Postprocedure Evaluation (Signed)
  Anesthesia Post-op Note  Patient: Wayne Montoya  Procedure(s) Performed: Procedure(s): INTRAMEDULLARY (IM) NAIL INTERTROCHANTRIC Right (Right)  Patient Location: ICU  Anesthesia Type:General  Level of Consciousness: Patient remains intubated per anesthesia plan  Airway and Oxygen Therapy: Patient remains intubated per anesthesia plan and Patient placed on Ventilator (see vital sign flow sheet for setting)  Post-op Pain: none  Post-op Assessment: Post-op Vital signs reviewed, Patient's Cardiovascular Status Stable, Respiratory Function Stable, Patent Airway, No signs of Nausea or vomiting and Pain level controlled  Post-op Vital Signs: stable  Complications: No apparent anesthesia complications

## 2012-08-30 NOTE — Progress Notes (Signed)
Patient ID: Wayne Montoya, male   DOB: 1922-12-18, 79 y.o.   MRN: [id number] Subjective: 1 Day Post-Op Procedure(s) (LRB): INTRAMEDULLARY (IM) NAIL INTERTROCHANTRIC Right (Right)  Patient remains intubated, sedated after operation with goal of perhaps having thoracentesis performed today  No events noted Remains stable    Objective:   VITALS:   Filed Vitals:   08/30/12 0900  BP: 122/80  Pulse: 108  Temp:   Resp: 24    Intact pulses distally Incision: scant drainage  LABS  Recent Labs  08/28/12 0410 08/29/12 0505 08/30/12 0545  HGB 11.8* 11.4* 11.2*  HCT 37.2* 34.3* 33.9*  WBC 11.3* 10.1 14.9*  PLT 185 147* 177     Recent Labs  08/28/12 0410 08/29/12 0505 08/30/12 0545  NA 137 140 144  K 3.7 3.7 3.6  BUN 40* 45* 50*  CREATININE 2.09* 1.67* 1.80*  GLUCOSE 122* 158* 154*     Recent Labs  08/30/12 0545  INR 3.59*     Assessment/Plan: 1 Day Post-Op Procedure(s) (LRB): INTRAMEDULLARY (IM) NAIL INTERTROCHANTRIC Right (Right)   {Plan: Orthopaedically stable for now 25-50% WB RLE when cleared medically Daily dressing changes as needed starting tomorrow, 7/18

## 2012-08-30 NOTE — Progress Notes (Signed)
OT Cancellation Note  Patient Details Name: PRAYAN ULIN MRN: [id number] DOB: [birth year]   Cancelled Treatment:    Reason Eval/Treat Not Completed: Medical issues which prohibited therapy (JUST EXTUBATED)  [facility] Aseel [place], OTR/L  [phone] 08/30/2012 08/30/2012, 1:32 PM

## 2012-08-30 NOTE — Progress Notes (Signed)
PT Cancellation Note  Patient Details Name: Wayne Montoya MRN: [id number] DOB: August 11, 1922   Cancelled Treatment:    Reason Eval/Treat Not Completed: Patient not medically ready   Fabio Asa 08/30/2012, 1:44 PM

## 2012-08-30 NOTE — Progress Notes (Deleted)
Pt . Expired at 1606. Pt pronounced per protocol per two RN's P. Tiny Chaudhary RN and C. Rice RN. Clergy at bedside with family. Emotional support given to family

## 2012-08-30 NOTE — Procedures (Signed)
**Note De-Identified Japji Kok Obfuscation** Extubation Procedure Note  Patient Details:   Name: Wayne Montoya DOB: 29-Nov-1922 MRN: [id number]   Airway Documentation:     Evaluation  O2 sats: stable throughout Complications: No apparent complications Patient did tolerate procedure well. Bilateral Breath Sounds: Clear;Diminished   Yes + leak, no stridor noted Wayne Montoya, Megan Salon 08/30/2012, 11:34 AM

## 2012-08-30 NOTE — Progress Notes (Signed)
PULMONARY  / CRITICAL CARE MEDICINE  Name: Wayne Montoya MRN: [id number] DOB: Mar 28, 1922    ADMISSION DATE:  08/26/2012 CONSULTATION DATE:  7/13   REFERRING MD :  [facility]  PRIMARY SERVICE:  [facility]   CHIEF COMPLAINT:  Left Pleural effusion -recurrent   BRIEF PATIENT DESCRIPTION:  79yo male SNF resident with mult medical problems including CHF, AFib on coumadin, chronic renal insufficiency, hx CVA, chronic C2 fx  admitted by [facility] 7/13 for R. Hip Fx after fall . Found to have recurrent Left Pleural effusion .  PCCM asked for consult for preop clearance /pl effusion.  SIGNIFICANT EVENTS / STUDIES:  CT C Spine >Old appearing ununited fracture of the base of the odontoid process  of C2. There is posterior subluxation of the odontoid process with respect to the body of C2  7/13 CT chest > Moderate, simple left pleural effusion. Compression atelectasis of the LLL and lingula 7/16 R IM nail femur  LINES / TUBES: 7/16 ETT >>  CULTURES:  ANTIBIOTICS:  SUBJECTIVE:  Tolerating pressure support.  VITAL SIGNS: Temp:  [98.2 F (36.8 C)-100.6 F (38.1 C)] 100.6 F (38.1 C) (07/17 0830) Pulse Rate:  [82-108] 108 (07/17 0900) Resp:  [11-31] 24 (07/17 0900) BP: (84-208)/(39-95) 122/80 mmHg (07/17 0900) SpO2:  [96 %-100 %] 100 % (07/17 0900) FiO2 (%):  [40 %-61.4 %] 41.1 % (07/17 0900) Weight:  [199 lb 4.7 oz (90.4 kg)] 199 lb 4.7 oz (90.4 kg) (07/17 0400)  PHYSICAL EXAMINATION: General:  Elderly WM  Comfortable on vent HEENT: NCAT, hard collar in place, ETT in place PULM: diminished on left, clear breath sounds on R CV: Irreg irreg, no mgr AB: BS+ infrequent, soft Ext: trace edema legs, warm, well perfused Neuro: Somnolent   Recent Labs Lab 08/28/12 0410 08/29/12 0505 08/30/12 0545  NA 137 140 144  K 3.7 3.7 3.6  CL 98 102 107  CO2 26 26 28   BUN 40* 45* 50*  CREATININE 2.09* 1.67* 1.80*  GLUCOSE 122* 158* 154*    Recent Labs Lab 08/28/12 0410 08/29/12 0505 08/30/12 0545   HGB 11.8* 11.4* 11.2*  HCT 37.2* 34.3* 33.9*  WBC 11.3* 10.1 14.9*  PLT 185 147* 177   Dg Hip Operative Right  08/29/2012   *RADIOLOGY REPORT*  Clinical Data: Right hip fracture.  DG OPERATIVE RIGHT HIP  Comparison: 08/26/2012 radiographs.  Findings: Three spot fluoroscopic images of the right hip demonstrate compression screw and intramedullary nail fixation of the intertrochanteric right femur fracture.  There is near anatomic reduction of the main fracture fragments.  Hardware appears well positioned.  There is no dislocation.  IMPRESSION: Improved alignment of the intertrochanteric right femur fracture status post ORIF.   Original Report Authenticated By: Carey Bullocks, M.D.   Dg Chest Port 1 View  08/29/2012   *RADIOLOGY REPORT*  Clinical Data: Intubated.  PORTABLE CHEST - 1 VIEW  Comparison: 08/28/2012  Findings: Endotracheal tube is 2.8 cm above the carina. Nasogastric tube is in the upper stomach region.  Left basilar densities are most compatible with pleural effusion and consolidation. Haziness at the right lung base may represent atelectasis.  Heart size appears to be upper limits of normal.  No evidence for a pneumothorax.  IMPRESSION: Support apparatuses as described.  Nasogastric tube in the upper stomach region.  Left basilar densities are suggestive for pleural fluid and consolidation.   Original Report Authenticated By: Richarda Overlie, M.D.    ASSESSMENT / PLAN: 1. Recurrent Left Pleural Effusion ? Etiology  Prev admit 06/2012 with Left effusion s/p thoracentesis (950cc) felt possible parapneumonic +/- CHF (cytology neg ) . Re-occured on CXR 7/3 .  >  Post op respiratory failure > currently stable on vent  Plan  -pressure support wean as tolerated >> family would not want re-intubation -defer thoracentesis -f/u CXR   2. R. Hip Fracture > s/p IM nailing Plan  Per Ortho   3.  Chronic C2 Fracture with new C2 subluxation - Pt to cont with C Collar  >> d/w neurosurgery about  whether he can change collar type   4. CHF /Atrial Fib /CAD s/p CABG /HTN  recent echocardiogram completed July 2014 demonstrated a systolic function of 30-35% Coumadin at home  Plan  Cardiology following  Cont Diuresis  Keep Neg to even bal  Argatroban per cards (Hx of HIT )   5. DM  SSI  6. Acute on Chronic Renal insufficiency (baseline 1.4 7/3)   Plan  Avoid nephrotoxins    6. Dementia /Pain  Limit sedation while weaning vent  Discussed plan with pt's son.  He has made it through surgery, and respiratory mechanics good with SBT.  Defer thoracentesis in this setting.  Will hopefully extubate soon, and then son confirms that he should not be re-intubated if he develops respiratory failure again.  He would be okay with trial of BiPAP.  CC time 35 minutes.  Coralyn Helling, MD [facility] Pulmonary/Critical Care 08/30/2012, 10:44 AM Pager:  [phone] After 3pm call: [phone]

## 2012-08-30 NOTE — Progress Notes (Signed)
ANTICOAGULATION CONSULT NOTE Pharmacy Consult for Argatroban Indication: h/o Afib  Allergies  Allergen Reactions  . Hydralazine Hcl Other (See Comments)    Dizziness. Pt has been tolerating IV hydralazine this admit.  . Heparin Other (See Comments)    HIT per [facility]  . Moxifloxacin Other (See Comments)    Per [facility]  . Penicillins Other (See Comments)    Per [facility]    Patient Measurements: Height: 5\' 10"  (177.8 cm) Weight: 199 lb 4.7 oz (90.4 kg) IBW/kg (Calculated) : 73   Vital Signs: Temp: 99.1 F (37.3 C) (07/17 1600) Temp src: Oral (07/17 1600) BP: 112/50 mmHg (07/17 1800) Pulse Rate: 83 (07/17 1800)  Labs:  Recent Labs  08/28/12 0410  08/29/12 0505 08/30/12 0545 08/30/12 1800  HGB 11.8*  --  11.4* 11.2*  --   HCT 37.2*  --  34.3* 33.9*  --   PLT 185  --  147* 177  --   APTT 101*  < > 92* 60* 98*  LABPROT  --   --   --  34.5*  --   INR  --   --   --  3.59*  --   CREATININE 2.09*  --  1.67* 1.80*  --   < > = values in this interval not displayed.  Estimated Creatinine Clearance: 32.1 ml/min (by C-G formula based on Cr of 1.8).  Assessment: 79 YO Male with h/o of AFib, Coumadin on hold for thoracentesis and hip surgery (s/p surgery today, 7/17, thoracentesis deferred).  The aPTT reamins slightly above goal at 98 despite rate decrease earlier today to 0.25 mcg/kg/min.  No complications with infusion or bleeding noted per RN.  Goal of Therapy:  aPTT 50-90 seconds Monitor platelets by anticoagulation protocol: Yes   Plan:  - Decrease Argatroban to 0.20 mcg/kg/min  - F/u 2h PTT - Continue daily PTT, CBC  Wynonia Medero L. Illene Bolus, PharmD, BCPS Clinical Pharmacist Pager: [phone] Pharmacy: [phone] 08/30/2012 6:33 PM

## 2012-08-30 NOTE — Op Note (Signed)
Wayne Montoya, Wayne Montoya NO.:  [id number]  MEDICAL RECORD NO.:  [id number]  LOCATION:  2906                         FACILITY:  [facility]  PHYSICIAN:  Madlyn Frankel. Charlann Boxer, M.D.  DATE OF BIRTH:  August 10, 1922  DATE OF PROCEDURE:  08/29/2012 DATE OF DISCHARGE:                              OPERATIVE REPORT   PREOPERATIVE DIAGNOSIS:  Comminuted right intertrochanteric femur fracture.  POSTOPERATIVE DIAGNOSIS:  Comminuted right intertrochanteric femur fracture.  PROCEDURE:  Open reduction and internal fixation of right comminuted intertrochanteric femur fracture utilizing a Biomet and Fixus nail 11 x 130 mm with a 115 mm lag screw and distal interlock.  SURGEON:  Madlyn Frankel. Charlann Boxer, M.D.  ASSISTANT:  Surgical team.  ANESTHESIA:  General.  BLOOD LOSS:  About 50 mL.  DRAINS:  None.  COMPLICATION:  None.  INDICATION FOR THE PROCEDURE:  [name] is an 79 year old gentleman who unfortunately had a fall at home during shower.  He was admitted to the hospital with immediate deformity of right lower extremity, inability to bear weight, and excessive amount of discomfort.  Radiographs revealed a comminuted intertrochanteric femur fracture.  The patient's son Wayne Montoya is a nurse at the [employer] and raised concerns about his medical comorbidities and we subsequently arranged for consultation with Cardiology and Pulmonary as well as Neurosurgery for all of the issues noted in his history.  Once cleared, the decision was procedure with surgery for stabilization of the fracture.  Consent was obtained after discussing the risks of nonunion and need for future surgeries as well as the medical risks and comorbidities discussed.  Consent was obtained for benefit of fracture management.  PROCEDURE IN DETAIL:  The patient was brought to the operative theater. Once adequate anesthesia, 900 mg of clindamycin was administered.  The patient was positioned supine on the fracture table and his  left lower extremity was flexed and abducted out of the way with bony prominences padded.  The right foot was placed in a traction boot.  Traction and internal rotation was applied allowing for a better evaluation of fracture under traction and indirect reduction.  The patient had a very comminuted proximal femur fracture including a butterfly fragment at the posterior trochanter and lesser trochanteric segment.  Once positioned and fracture reduced nearly anatomically, the right hip was prescrubbed and prepped and draped in the sterile fashion.  A time- out was performed identifying the patient, planned procedure, and extremity.  The fluoroscopy was brought back to the field.  Lateral incision was made at the proximal trochanter.  Sharp dissection was carried down to and then through the gluteal fascia.  Under fluoroscopic imaging, I decided to place the tip of the guidewire into the posterior aspect of the trochanter to better help reduce the fragment.  With this piece captured initially with the guide pin, but then subsequently supported by the use of a starting awl, I passed a guidewire into the proximal femur.  We then drilled the proximal femur.  We tried a passed the intramedullary nail by hand.  I felt resistance into the shaft of the metaphyseal region and evaluating, radiographically felt that his isthmus of the canal was very tight.  I subsequently placed the ball-tip guidewire over the placed intramedullary nail, removed the nail, and then reamed from a 10 mm reamer up to a 13 mm reamer.  I then was able to pass the nail by hand over the ball-tip guidewire easily to its appropriate location  At this point, I made a separate stab incision at the area of the butterfly fragment at the posterior aspect of trochanter and applying direct pressure, I was able to help reduce the posterior fragment of the trochanter.  At this point, based off the jig, an incision was  made laterally, the lag screw insertion jig was then placed, and then the guidewire passed into the center of femoral head slightly inferior on AP view and centrally on the lateral view.  I then selected a 115 mm lag screw drill and then placed this screw.  Based on the comminuted nature, I decided to lock this screw down in place.  Prior to me locking this down, I did use a tensioned bar and tensioned it down medially with a good fixation medially.  I went ahead and locked the construct in place to make this fixed angle device.  Off the jig, I placed a distal interlock into the static position.  The jig was then removed.  The wounds were irrigated with normal saline solution.  The wounds were irrigated with normal saline solution. Proximal incision was reapproximated with #1 Vicryl.  The remainder of the wound was closed with 2-0 Vicryl and staples on the skin.  The skin was cleaned, dried, and dressed sterilely using long Mepilex dressing. The patient based on his preoperative medical comorbidities was planned to proceed to the intensive care unit intubated with extubation planned later once stable.  This was Anesthesia's decision.  There was no significant complications noted in the intraoperative period.  Postoperatively, based on this fracture pattern and the comminuted nature, I am going to limit his weightbearing to 25% to 50% allowing for transfers predominantly bearing weight to his left lower extremity and upper extremity.     Madlyn Frankel Charlann Boxer, M.D.     MDO/MEDQ  D:  08/29/2012  T:  08/30/2012  Job:  [id number]

## 2012-08-30 NOTE — Progress Notes (Signed)
NUTRITION FOLLOW UP  Intervention:   1. Modify diet; resume once medically appropriate. Recommend Regular goal.  2. Supplements; Ensure Complete po TID, each supplement provides 350 kcal and 13 grams of protein.  Nutrition Dx:   Inadequate oral intake, omission of energy dense foods  Monitor:  1. Food/Beverage; resume of diet with pt meeting >/=90% estimated needs with tolerance.  2. Wt/wt change; monitor trends  Assessment:   Pt admitted s/p fall with hip surgery. Per son, pt was eating well PTA- lives in an assisted living facility and participates in 3 meals/day.   RD following for diet advancement and assistance in meeting needs.  Pt with prolonged intubation s/p surgery yesterday.  Extubated this am per RN report.  RD to follow.   Height: Ht Readings from Last 1 Encounters:  08/26/12 5\' 10"  (1.778 m)    Weight Status:   Wt Readings from Last 1 Encounters:  08/30/12 199 lb 4.7 oz (90.4 kg)    Re-estimated needs:  Kcal: 2020-2300 Protein: 90-115g Fluid: ~2.0 L/day  Skin: intact  Diet Order:  NPO   Intake/Output Summary (Last 24 hours) at 08/30/12 1225 Last data filed at 08/30/12 1100  Gross per 24 hour  Intake 1730.05 ml  Output   1155 ml  Net 575.05 ml    Last BM: none documented.    Labs:   Recent Labs Lab 08/28/12 0410 08/29/12 0505 08/30/12 0545  NA 137 140 144  K 3.7 3.7 3.6  CL 98 102 107  CO2 26 26 28   BUN 40* 45* 50*  CREATININE 2.09* 1.67* 1.80*  CALCIUM 9.6 9.5 9.3  GLUCOSE 122* 158* 154*    CBG (last 3)   Recent Labs  08/29/12 2326 08/30/12 0330 08/30/12 0807  GLUCAP 156* 152* 140*    Scheduled Meds: . antiseptic oral rinse  15 mL Mouth Rinse QID  . chlorhexidine  15 mL Mouth Rinse BID  . hydrALAZINE  10 mg Intravenous Q6H  . insulin aspart  0-20 Units Subcutaneous Q4H  . labetalol  10 mg Intravenous Q6H  . nitroGLYCERIN  0.2 mg Transdermal Daily    Continuous Infusions: . sodium chloride 50 mL/hr at 08/29/12 1053   . sodium chloride 50 mL/hr at 08/29/12 2128    Loyce Dys, MS RD LDN Clinical Inpatient Dietitian Pager: [phone] Weekend/After hours pager: [phone]

## 2012-08-30 NOTE — Progress Notes (Signed)
ANTICOAGULATION CONSULT NOTE - Follow Up Consult  Pharmacy Consult for Argatroban, Coumadin Indication: atrial fibrillation  Allergies  Allergen Reactions  . Hydralazine Hcl Other (See Comments)    Dizziness. Pt has been tolerating IV hydralazine this admit.  . Heparin Other (See Comments)    HIT per [facility]  . Moxifloxacin Other (See Comments)    Per [facility]  . Penicillins Other (See Comments)    Per [facility]    Patient Measurements: Height: 5\' 10"  (177.8 cm) Weight: 199 lb 4.7 oz (90.4 kg) IBW/kg (Calculated) : 73 Heparin Dosing Weight:    Vital Signs: Temp: 100.1 F (37.8 C) (07/17 1147) Temp src: Oral (07/17 1147) BP: 102/43 mmHg (07/17 1200) Pulse Rate: 101 (07/17 1200)  Labs:  Recent Labs  08/28/12 0410  08/28/12 1817 08/29/12 0505 08/30/12 0545  HGB 11.8*  --   --  11.4* 11.2*  HCT 37.2*  --   --  34.3* 33.9*  PLT 185  --   --  147* 177  APTT 101*  < > 70* 92* 60*  LABPROT  --   --   --   --  34.5*  INR  --   --   --   --  3.59*  CREATININE 2.09*  --   --  1.67* 1.80*  < > = values in this interval not displayed.  Estimated Creatinine Clearance: 32.1 ml/min (by C-G formula based on Cr of 1.8).   Assessment: 53 YOM admitted with hip fracture to undergo repair. Hx Afib on warfarin at home (home dose 7.5mg /5mg  alternating daily,confirmed with assisted living 7/13; last received 7.5mg  7/11 and 5mg  7/12) with hx of HIT.  AC: Hip surgery 7/17 (thoracentesis deferred). CHADS2=6. Ok to resume Argatroban and Coumadin (if alert enough to take it) per Dr. Craige Cotta. INR 3.59 due to interaction with Argatroban.  **(Argatroban, when coadministered with warfarin, produces a combined effect on the laboratory measurement of the INR. However, the combined therapy does not affect the vitamin-K-dependent factor Xa activity.)  **Argatroban and warfarin should be administered with at least a 5 day overlap and an INR ? 4 for 24 hours. If INR ?4, continue concomitant therapy.  ID:  Tmax 100.6. WBC 14.9 up.  Cards: CHF, Afib, CAD, HTN EF 30-35% 102/43, HR 101; labetolol-IV, nitro patch, IV hydralazine  Endo: last A1C 07/2012- 6.6, CBGs 140-173 on SSI  Neuro: h/o CVA, very hard of hearing. Chronic C2 Fracture; wears C-collar  Renal: CRI, SCr 1.8 up CrCl ~32  Pulm: VDRF.Has large pleural effusion- had in May and had thoracentesis, returned 7/3 when seen by Dr. [name]; 2L [place]  Heme/Onc: Hg= 11.2, plt= 177  PTA Medication Issues: Lantus, Prilosec, warfarin  Goal of Therapy:  INR ? 4 for 24 hours while on Argatroban aPTT 50-90 seconds Monitor platelets by anticoagulation protocol: Yes   Plan:  1. Holding on thoracentesis for now.  2. Start Argatroban at 0.83mcg/kg/min (his last drip rate before discontinuation). OK to start now per Dr. Charlann Boxer.  3. APTT 2 hrs after Argatroban starts.  4. Coumadin 5mg  po x 1 tonight if alert enough to take it per Dr. Craige Cotta.    Carmelite Violet S. Merilynn Finland, PharmD, BCPS Clinical Staff Pharmacist Pager [phone]  Misty Stanley Stillinger 08/30/2012,2:41 PM

## 2012-08-31 ENCOUNTER — Inpatient Hospital Stay (HOSPITAL_COMMUNITY): Payer: Medicare Other

## 2012-08-31 ENCOUNTER — Encounter (HOSPITAL_COMMUNITY): Payer: Self-pay | Admitting: Orthopedic Surgery

## 2012-08-31 LAB — BASIC METABOLIC PANEL
CO2: 29 mEq/L (ref 19–32)
Chloride: 109 mEq/L (ref 96–112)
Creatinine, Ser: 2.1 mg/dL — ABNORMAL HIGH (ref 0.50–1.35)
GFR calc Af Amer: 31 mL/min — ABNORMAL LOW (ref >90)
Potassium: 3.4 mEq/L — ABNORMAL LOW (ref 3.5–5.1)
Sodium: 145 mEq/L (ref 135–145)

## 2012-08-31 LAB — PROTIME-INR
INR: 5.6 (ref 0.00–1.49)
INR: 3.96 — ABNORMAL HIGH (ref 0.00–1.49)
Prothrombin Time: 48.4 seconds — ABNORMAL HIGH (ref 11.6–15.2)

## 2012-08-31 LAB — CBC
HCT: 29.3 % — ABNORMAL LOW (ref 39.0–52.0)
HCT: 29.8 % — ABNORMAL LOW (ref 39.0–52.0)
Hemoglobin: 9.4 g/dL — ABNORMAL LOW (ref 13.0–17.0)
Hemoglobin: 9.6 g/dL — ABNORMAL LOW (ref 13.0–17.0)
MCH: 26.6 pg (ref 26.0–34.0)
MCHC: 32.2 g/dL (ref 30.0–36.0)
RBC: 3.56 MIL/uL — ABNORMAL LOW (ref 4.22–5.81)
RBC: 3.61 MIL/uL — ABNORMAL LOW (ref 4.22–5.81)
RDW: 16.4 % — ABNORMAL HIGH (ref 11.5–15.5)
WBC: 10 10*3/uL (ref 4.0–10.5)

## 2012-08-31 LAB — GLUCOSE, CAPILLARY
Glucose-Capillary: 124 mg/dL — ABNORMAL HIGH (ref 70–99)
Glucose-Capillary: 162 mg/dL — ABNORMAL HIGH (ref 70–99)
Glucose-Capillary: 166 mg/dL — ABNORMAL HIGH (ref 70–99)
Glucose-Capillary: 192 mg/dL — ABNORMAL HIGH (ref 70–99)
Glucose-Capillary: 234 mg/dL — ABNORMAL HIGH (ref 70–99)

## 2012-08-31 LAB — APTT: aPTT: 111 seconds — ABNORMAL HIGH (ref 24–37)

## 2012-08-31 MED ORDER — LABETALOL HCL 100 MG PO TABS
100.0000 mg | ORAL_TABLET | Freq: Two times a day (BID) | ORAL | Status: DC
  Administered 2012-08-31 – 2012-09-02 (×4): 100 mg via ORAL
  Filled 2012-08-31 (×5): qty 1

## 2012-08-31 MED ORDER — POTASSIUM CHLORIDE IN NACL 20-0.9 MEQ/L-% IV SOLN
INTRAVENOUS | Status: DC
  Administered 2012-08-31: 75 mL/h via INTRAVENOUS
  Filled 2012-08-31: qty 1000

## 2012-08-31 MED ORDER — WARFARIN SODIUM 5 MG PO TABS
5.0000 mg | ORAL_TABLET | Freq: Once | ORAL | Status: AC
  Administered 2012-08-31: 5 mg via ORAL
  Filled 2012-08-31: qty 1

## 2012-08-31 NOTE — Progress Notes (Signed)
eLink Physician-Brief Progress Note Patient Name: Wayne Montoya DOB: 08-26-1922 MRN: [id number]  Date of Service  08/31/2012   HPI/Events of Note  Patient on argatrobain for prior HIT. Started on coumadin 08/29/12. RN calling with rissing INRs inface of decreasing argatroban infusino rate  Recent Labs Lab 08/29/12 0505 08/30/12 0545 08/31/12 0215  HGB 11.4* 11.2* 9.4*  HCT 34.3* 33.9* 29.3*  WBC 10.1 14.9* 10.0  PLT 147* 177 178   .  Recent Labs Lab 08/27/12 0500 08/28/12 0410 08/29/12 0505 08/30/12 0545 08/31/12 0215  CREATININE 2.05* 2.09* 1.67* 1.80* 2.10*     Recent Labs Lab 08/29/12 0505 08/30/12 0545 08/30/12 1800 08/30/12 2215 08/31/12 0215  APTT 92* 60* 98* 102* 111*    Recent Labs Lab 08/26/12 0511 08/30/12 0545 08/31/12 0215  INR 1.59* 3.59* 5.60*     eICU Interventions  Spoke to pharmacist, patient on very mminute argatroban infuson rate but despite reducing this PTT and  INR going up  PLAN  - because INR is > 4, stop argatroban and recheck INR in 4-6h   - restart argotroabn only if INR  < 4   Intervention Category Major Interventions: Respiratory failure - evaluation and management  Wayne Montoya 08/31/2012, 4:12 AM

## 2012-08-31 NOTE — Progress Notes (Signed)
Patient refuses to wear SCD's at this time. Aspen collar has been placed on the patient and patient is now resting comfortably. Patient does require frequent checks due to constantly pulling at foley catheter and oxygen tubing. Nursing will continue to monitor for any changes.

## 2012-08-31 NOTE — Progress Notes (Signed)
PULMONARY  / CRITICAL CARE MEDICINE  Name: Wayne Montoya MRN: [id number] DOB: 1923-01-30    ADMISSION DATE:  08/26/2012 CONSULTATION DATE:  7/13   REFERRING MD :  [facility]  PRIMARY SERVICE:  [facility]   CHIEF COMPLAINT:  Left Pleural effusion -recurrent   BRIEF PATIENT DESCRIPTION:  79yo male SNF resident with mult medical problems including CHF, AFib on coumadin, chronic renal insufficiency, hx CVA, chronic C2 fx  admitted by [facility] 7/13 for R. Hip Fx after fall . Found to have recurrent Left Pleural effusion .  PCCM asked for consult for preop clearance /pl effusion.  SIGNIFICANT EVENTS / STUDIES:  CT C Spine >Old appearing ununited fracture of the base of the odontoid process  of C2. There is posterior subluxation of the odontoid process with respect to the body of C2  7/13 CT chest > Moderate, simple left pleural effusion. Compression atelectasis of the LLL and lingula 7/16 R IM nail femur  LINES / TUBES: 7/16 ETT >> 7/17  SUBJECTIVE:  Remains on nasal cannula  VITAL SIGNS: Temp:  [97.7 F (36.5 C)-100.1 F (37.8 C)] 97.7 F (36.5 C) (07/18 0700) Pulse Rate:  [67-110] 78 (07/18 0800) Resp:  [17-29] 19 (07/18 0800) BP: (92-153)/(40-70) 114/56 mmHg (07/18 0600) SpO2:  [89 %-100 %] 100 % (07/18 0800) FiO2 (%):  [40 %-41.4 %] 41.4 % (07/17 1130) Weight:  [200 lb 13.4 oz (91.1 kg)] 200 lb 13.4 oz (91.1 kg) (07/18 0500)  PHYSICAL EXAMINATION: General: no distress HEENT: cervical collar in place PULM: decreased breath sounds Rt base CV: irregular AB: soft, nontender Ext: 1+ edema Neuro: Awake, intermittently agitated, follows commands  Labs: CBC Recent Labs     08/29/12  0505  08/30/12  0545  08/31/12  0215  WBC  10.1  14.9*  10.0  HGB  11.4*  11.2*  9.4*  HCT  34.3*  33.9*  29.3*  PLT  147*  177  178    Coag's Recent Labs     08/30/12  0545  08/30/12  1800  08/30/12  2215  08/31/12  0215  APTT  60*  98*  102*  111*  INR  3.59*   --    --   5.60*     BMET Recent Labs     08/29/12  0505  08/30/12  0545  08/31/12  0215  NA  140  144  145  K  3.7  3.6  3.4*  CL  102  107  109  CO2  26  28  29   BUN  45*  50*  63*  CREATININE  1.67*  1.80*  2.10*  GLUCOSE  158*  154*  133*    Electrolytes Recent Labs     08/29/12  0505  08/30/12  0545  08/31/12  0215  CALCIUM  9.5  9.3  9.3    ABG Recent Labs     08/29/12  2138  PHART  7.327*  PCO2ART  52.2*  PO2ART  122.0*    Liver Enzymes Recent Labs     08/30/12  0545  AST  15  ALT  8  ALKPHOS  57  BILITOT  1.2  ALBUMIN  2.3*    Glucose Recent Labs     08/30/12  0807  08/30/12  1229  08/30/12  1625  08/30/12  2012  08/30/12  2331  08/31/12  0516  GLUCAP  140*  149*  167*  166*  147*  140*    Imaging  Dg Hip Operative Right  08/29/2012   *RADIOLOGY REPORT*  Clinical Data: Right hip fracture.  DG OPERATIVE RIGHT HIP  Comparison: 08/26/2012 radiographs.  Findings: Three spot fluoroscopic images of the right hip demonstrate compression screw and intramedullary nail fixation of the intertrochanteric right femur fracture.  There is near anatomic reduction of the main fracture fragments.  Hardware appears well positioned.  There is no dislocation.  IMPRESSION: Improved alignment of the intertrochanteric right femur fracture status post ORIF.   Original Report Authenticated By: Carey Bullocks, M.D.   Dg Chest Port 1 View  08/31/2012   *RADIOLOGY REPORT*  Clinical Data: Follow-up evaluation for pleural effusion.  PORTABLE CHEST - 1 VIEW  Comparison: Chest x-ray 08/29/2012.  Findings: Previously noted endotracheal and nasogastric tubes have been removed.  Lung volumes remain low.  There continues to be complete opacification at the base of the left hemithorax, compatible with areas of atelectasis and/or consolidation, with superimposed moderate left-sided pleural effusion which is essentially unchanged allowing for slight differences in patient positioning.  Mild crowding of  the pulmonary vasculature, accentuated by low lung volumes, without frank pulmonary edema. Heart size appears enlarged (unchanged).  Upper mediastinal contours are distorted by low lung volumes, kyphotic positioning and slight rotation to the left.  Atherosclerosis in the thoracic aorta.  Status post median sternotomy for CABG.  IMPRESSION: 1.  Support apparatus and postoperative changes, as above. 2.  Low lung volumes with persistent atelectasis and/or consolidation in the left lower lobe and moderate left-sided pleural effusion which is unchanged. 3.  Atherosclerosis.   Original Report Authenticated By: Trudie Reed, M.D.   Dg Chest Port 1 View  08/29/2012   *RADIOLOGY REPORT*  Clinical Data: Intubated.  PORTABLE CHEST - 1 VIEW  Comparison: 08/28/2012  Findings: Endotracheal tube is 2.8 cm above the carina. Nasogastric tube is in the upper stomach region.  Left basilar densities are most compatible with pleural effusion and consolidation. Haziness at the right lung base may represent atelectasis.  Heart size appears to be upper limits of normal.  No evidence for a pneumothorax.  IMPRESSION: Support apparatuses as described.  Nasogastric tube in the upper stomach region.  Left basilar densities are suggestive for pleural fluid and consolidation.   Original Report Authenticated By: Richarda Overlie, M.D.         ASSESSMENT / PLAN: Recurrent left pleural effusion >> no respiratory compromise. P: -monitor clinically >> if he develops respiratory distress, then repeat CXR and assess for thoracentesis.  DNR/DNI.  PCCM will sign off.  Please call if further help needed.  Coralyn Helling, MD [facility] Pulmonary/Critical Care 08/31/2012, 9:30 AM Pager:  [phone] After 3pm call: [phone]

## 2012-08-31 NOTE — Progress Notes (Signed)
Patient is continuing to pull all heart monitor leads off.  He has also pulled his aspen collar off.  Nsg to continue to monitor for status changes.

## 2012-08-31 NOTE — Progress Notes (Signed)
CRITICAL VALUE ALERT  Critical value received:  INR=5.60  Date of notification:  08/31/2012  Time of notification:  0247  Critical value read back:yes  Nurse who received alert:  G.Mayford Knife RN  MD notified (1st page):  Dr Marchelle Gearing [facility] nurse called & given value)  Time of first page:  0248  MD notified (2nd page):  Time of second page:  Responding MD:  Dr Marchelle Gearing  Time MD responded:  [phone]

## 2012-08-31 NOTE — Progress Notes (Signed)
[facility] Progress Note [place] TEAM 1 - Stepdown/ICU TEAM   CLOUD GRAHAM ZOX:[id number] DOB: 1923/01/29 DOA: 08/26/2012 PCP: Wilmer Floor., MD  Brief narrative: 79 year old male patient with multiple medical problems. Recently discharged last month to an assisted living facility after developing deconditioning during treatment for left pleural effusion related to chronic heart failure as well as treatment for healthcare acquired pneumonia. He was sent back to the emergency department after tripping and falling into his shower. Patient is noted to have an old C2 fracture in 2011 but this was not surgically repaired. Evaluation in the ER this admission showed a C2 subluxation on top of the old C2 fracture (unclear if acute or chronic), recurrence of his left pleural effusion, and an acute right hip fracture comminuted in nature.  Since admission patient has undergone preoperative evaluation by Cardiology who felt from a congestive heart failure standpoint the patient was compensated and appropriate for surgery placing him at a moderate risk for cardiovascular complications. Neurosurgery evaluated the patient because of the new subluxation at the C2 level and the recommendations were to place a hard cervical collar and to minimize any manipulation of the neck during intubation. Please see the notes regarding discussion of risks and benefits with the patient and his family.   Assessment/Plan:  Acute respiratory failure with hypoxia due to Pleural effusion, left (recurrent) -on [place] oxygen -Pulmonary Medicine following -IR could not perform thoracentesis - Pulmonary felt patient stable enough to proceed with ORIF prior to thoracentesis and if clinically needed post op can perform thoracentesis  -Lasix was dc'd since pt clinically dry/NPO -DOES NOT WANT TO BE RE INTUBATED  C2 cervical fracture (old) with subluxation  -NS following -no indication clinically to repair old fx -cont  hard cervical collar -tol intubation for OR  Dehydration -has been NPO and BUN/Scr increasing -continue IVF x 12 more hours and increase rate -has started to tolerate full liquids  Intertrochanteric fracture of right hip/post ORIF 7/16 -Ortho following (Dr Charlann Boxer)   DIABETES MELLITUS, TYPE II -CBG controlled -cont SSI  Atrial fibrillation / 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 -rate controlled -on chronic coumadin but has known HIT so was bridging with Argatroban but held due to supra therapeutic INR - > 5 7/18-management per pharmacy -watch since BUN increase could be due to occult bleeding/watch Hgb -Coumadin has been resumed and Pharmacist is trying to confirm actual INR OFF Argatroban since Argatroban can cause FALSE elevations  -if INR off Argatroban is < 2 then will resume   CKD (chronic kidney disease) stage 3, GFR 30-59 ml/min -stable  HYPERTENSION -BP poorly controlled - meds adjusted - follow trend  CAD, Coronary ARTERY BYPASS GRAFT -no sx's -moderate CV surgical risk per Cards evaluation -cont BB  Chronic systolic heart failure/EF 30% -compensated -home Lasix on hold  Right ventricular dilation -as above   DVT prophylaxis: Argatroban Code Status: DO NOT RESUSCITATE/DO NOT INTUBATE Family Communication: Patient sleeping - no family present at time of visit  Disposition Plan: Transfer to Telemetry   Consultants: Cardiology Neurosurgery Orthopedics Pulmonary Medicine/signed off  Procedures: None  Antibiotics: None  HPI/Subjective: Patient endorsed pain greatly improved after surgery.  Objective: Blood pressure 127/66, pulse 96, temperature 99.7 F (37.6 C), temperature source Oral, resp. rate 18, height 5\' 10"  (1.778 m), weight 91.1 kg (200 lb 13.4 oz), SpO2 94.00%.  Intake/Output Summary (Last 24 hours) at 08/31/12 1442 Last data filed at 08/31/12 1300  Gross per 24 hour  Intake 1458.14 ml  Output  1010 ml  Net 448.14 ml     Exam: General: No acute respiratory distress Lungs: Coarse to auscultation bilaterally without wheezes or crackles but very diminished on left throughout entire lung, 2 L Cardiovascular: Irregular rate and rhythm without murmur gallop or rub normal S1 and S2, no peripheral edema or JVD Abdomen: Nontender, nondistended, soft, bowel sounds positive, no rebound, no ascites, no appreciable mass Musculoskeletal: No significant cyanosis, clubbing of bilateral lower extremities Neurological: Hard cervical collar intact  Scheduled Meds: Scheduled Meds: . antiseptic oral rinse  15 mL Mouth Rinse q12n4p  . hydrALAZINE  10 mg Intravenous Q6H  . insulin aspart  0-20 Units Subcutaneous Q4H  . labetalol  10 mg Intravenous Q6H  . nitroGLYCERIN  0.2 mg Transdermal Daily  . Warfarin - Pharmacist Dosing Inpatient   Does not apply q1800   Continuous Infusions: . 0.9 % NaCl with KCl 20 mEq / L 75 mL/hr (08/31/12 1140)    Data Reviewed: Basic Metabolic Panel:  Recent Labs Lab 08/27/12 0500 08/28/12 0410 08/29/12 0505 08/30/12 0545 08/31/12 0215  NA 135 137 140 144 145  K 3.9 3.7 3.7 3.6 3.4*  CL 99 98 102 107 109  CO2 25 26 26 28 29   GLUCOSE 150* 122* 158* 154* 133*  BUN 31* 40* 45* 50* 63*  CREATININE 2.05* 2.09* 1.67* 1.80* 2.10*  CALCIUM 9.6 9.6 9.5 9.3 9.3   Liver Function Tests:  Recent Labs Lab 08/26/12 1729 08/27/12 0500 08/28/12 0410 08/30/12 0545  AST 15  --  17 15  ALT 9  --  10 8  ALKPHOS 63  --  65 57  BILITOT 0.8  --  1.2 1.2  PROT 6.3 6.7 6.9 6.3  ALBUMIN 2.8* 3.0* 3.1* 2.3*   CBC:  Recent Labs Lab 08/27/12 0500 08/28/12 0410 08/29/12 0505 08/30/12 0545 08/31/12 0215  WBC 11.6* 11.3* 10.1 14.9* 10.0  HGB 12.3* 11.8* 11.4* 11.2* 9.4*  HCT 38.7* 37.2* 34.3* 33.9* 29.3*  MCV 82.3 83.4 82.1 82.5 82.3  PLT 174 185 147* 177 178   BNP (last 3 results)  Recent Labs  06/22/12 1907 08/26/12 1004  PROBNP 4692.0* 3413.0*   CBG:  Recent Labs Lab  08/30/12 2012 08/30/12 2331 08/31/12 0516 08/31/12 0742 08/31/12 1208  GLUCAP 166* 147* 140* 124* 112*    Recent Results (from the past 240 hour(s))  MRSA PCR SCREENING     Status: None   Collection Time    08/26/12  9:49 AM      Result Value Range Status   MRSA by PCR NEGATIVE  NEGATIVE Final   Comment:            The GeneXpert MRSA Assay (FDA     approved for NASAL specimens     only), is one component of a     comprehensive MRSA colonization     surveillance program. It is not     intended to diagnose MRSA     infection nor to guide or     monitor treatment for     MRSA infections.     Studies:  Recent x-ray studies have been reviewed in detail by the Attending Physician   patient seen and examined with nurse practitioner And agree with the assessment and plan Darrick Grinder, ANP [facility] Office  [phone] Pager [phone]  **If unable to reach the above provider after paging please contact the Flow Manager @ [phone]  On-Call/Text Page:      [email or web address]  password [email or web address]  If 7PM-7AM, please contact night-coverage [email or web address] Password [email or web address] 08/31/2012, 2:42 PM   LOS: 5 days

## 2012-08-31 NOTE — Progress Notes (Signed)
ANTICOAGULATION CONSULT NOTE - Follow Up Consult  Pharmacy Consult for argatroban Indication: atrial fibrillation  Labs:  Recent Labs  08/28/12 0410  08/29/12 0505 08/30/12 0545 08/30/12 1800 08/30/12 2215 08/31/12 0215  HGB 11.8*  --  11.4* 11.2*  --   --  9.4*  HCT 37.2*  --  34.3* 33.9*  --   --  29.3*  PLT 185  --  147* 177  --   --  178  APTT 101*  < > 92* 60* 98* 102* 111*  LABPROT  --   --   --  34.5*  --   --  48.4*  INR  --   --   --  3.59*  --   --  5.60*  CREATININE 2.09*  --  1.67* 1.80*  --   --   --   < > = values in this interval not displayed.   Assessment: 79yo male remains supratherapeutic on argatroban with increased PTT despite decreased rate.  Note that INR is affected by argatroban, expected to be high.  Goal of Therapy:  aPTT 50-90 seconds   Plan:  Will decrease argatroban by 40% to 0.07 mcg/kg/min and check PTT in 2hr.  Vernard Gambles, PharmD, BCPS  08/31/2012,2:53 AM

## 2012-08-31 NOTE — Progress Notes (Signed)
Patient continued to rip off tele leads and aspen collar.  IV was also taken out by patient.  MD notified, new orders; D/C iv, labetol 100mg  PO BID, D/C tele.  Patient has been yelling out, now resting in bed. Nsg to continue to monitor for status changes.

## 2012-08-31 NOTE — Progress Notes (Signed)
Agree with PT/OT cancellation note.  [place], [place] DPT [phone]

## 2012-08-31 NOTE — Progress Notes (Signed)
SLP Cancellation Note  Patient Details Name: Wayne Montoya MRN: [id number] DOB: June 09, 1922   Cancelled treatment:        Spoke with RN. Pt currently taking full liquid diet, tolerating per RN.  Pt currently unavailable for evaluation due to transfer to 5N11.  Will continue efforts.  Aalani Aikens B. Murvin [facility], CCC-SLP [phone]  Leigh Aurora 08/31/2012, 1:22 PM

## 2012-08-31 NOTE — Progress Notes (Signed)
Orthopedic Tech Progress Note Patient Details:  Wayne Montoya Dec 11, 1922 [id number] Patient refused ohf. Patient ID: Wayne Montoya, male   DOB: [birth year], 79 y.o.   MRN: [id number]   [name] 08/31/2012, 8:40 PM

## 2012-08-31 NOTE — Progress Notes (Signed)
PT/OT Cancellation Note  Patient Details Name: Wayne Montoya MRN: [id number] DOB: November 05, 1922   Cancelled Treatment:    Reason Eval/Treat Not Completed: Fatigue/lethargy limiting ability to participate. Pt just transferred into new room and wanting to rest. Also noted in chart that pt's INR is 5.6. Will re-attempt tomorrow.  08/31/2012 Cipriano Mile OTR/L Pager [phone] Office [phone]

## 2012-08-31 NOTE — Progress Notes (Addendum)
ANTICOAGULATION CONSULT NOTE Pharmacy Consult for Argatroban Indication: h/o Afib  Allergies  Allergen Reactions  . Hydralazine Hcl Other (See Comments)    Dizziness. Pt has been tolerating IV hydralazine this admit.  . Heparin Other (See Comments)    HIT per [facility] MAR  . Moxifloxacin Other (See Comments)    Per [facility] MAR  . Penicillins Other (See Comments)    Per [facility]  MAR    Patient Measurements: Height: 5\' 10"  (177.8 cm) Weight: 200 lb 13.4 oz (91.1 kg) IBW/kg (Calculated) : 73   Vital Signs: Temp: 99.7 F (37.6 C) (07/18 1357) Temp src: Oral (07/18 1142) BP: 127/66 mmHg (07/18 1357) Pulse Rate: 96 (07/18 1357)  Labs:  Recent Labs  08/29/12 0505 08/30/12 0545 08/30/12 1800 08/30/12 2215 08/31/12 0215 08/31/12 1458  HGB 11.4* 11.2*  --   --  9.4*  --   HCT 34.3* 33.9*  --   --  29.3*  --   PLT 147* 177  --   --  178  --   APTT 92* 60* 98* 102* 111*  --   LABPROT  --  34.5*  --   --  48.4* 37.2*  INR  --  3.59*  --   --  5.60* 3.96*  CREATININE 1.67* 1.80*  --   --  2.10*  --     Estimated Creatinine Clearance: 27.6 ml/min (by C-G formula based on Cr of 2.1).  Assessment: 79 YO Male with h/o of AFib, noted s/p hip surgery ( 7/17), thoracentesis deferred.  The patient has been on argatroban and INR this am was 5.6. Coumadin was ordered 7/17 but patient was not alert enough for administration.  Argatroban was discontinued this am at around 0300 (last rate 0.07 mcg/kg/min) due to INR with increasing trend and no recent coumadin  (last known dose was 08/24/12). INR 12 hours post argatroban discontinuation was 3.96.  At 12 hours after discontinuation the influence of argatroban is typically minimal. Of note aPTTs have shown an increased trend with continued decreases in argatroban infusion rates.  The pattern appears to show reduced argatroban clearance and it is very likely that the half life is prolonged. Reasons are not clear as liver functions appears normal (7/17  ast/alt= 158, t bili= 1.7). Respiratory failure, CKD, and HF may be contributing but would not anticipate this combination to influence argatroban dosing to this degree (typical dose is 0.8-1.0 mcg/kg/min).     Goal of Therapy:  INR goal = 2-3 INR goal while on argatroban= 4-6 aPTT 50-90 seconds Monitor platelets by anticoagulation protocol: Yes   Plan:  -Due to elevated INR will hold argatroban and coumadin for now -Daily aPTT and PT/INR for now  Harland German, Pharm D 08/31/2012 4:03 PM   Addendum: argatroban -aPTT= 76  The aPTT is considered 'at goal' and this level was taken about 12 hours after therapy was discontinued. An elevated aPTT likely confirms an extended elimination period for argatroban. The INR of 3.96 is a false elevation and does not reflect a need to hold coumadin.     Plan -Do not restart argatroban at this time. Could consider restart once aPTT is in lower end of goal range. -Coumadin 5mg  today (INR elevation likely due to argatroban) -Daily PT/INR -Daily aPTT  Harland German, Pharm D 08/31/2012 4:56 PM

## 2012-08-31 NOTE — Progress Notes (Signed)
Spoke w/ Dr Marchelle Gearing Re: argatroban now that INR is >4 and pt does not have active HIT (only h/o HIT per [facility] records, Plt >100 since admission).  Will d/c argatroban for now and recheck INR in 6hr to determine whether argatroban should be resumed.  Vernard Gambles, PharmD, BCPS 08/31/2012 4:17 AM

## 2012-09-01 LAB — CBC
MCH: 26.7 pg (ref 26.0–34.0)
MCH: 26.4 pg (ref 26.0–34.0)
MCHC: 32.5 g/dL (ref 30.0–36.0)
MCHC: 32.4 g/dL (ref 30.0–36.0)
Platelets: 184 10*3/uL (ref 150–400)
Platelets: 197 10*3/uL (ref 150–400)
RDW: 16.3 % — ABNORMAL HIGH (ref 11.5–15.5)

## 2012-09-01 LAB — URINALYSIS, ROUTINE W REFLEX MICROSCOPIC
Bilirubin Urine: NEGATIVE
Glucose, UA: NEGATIVE mg/dL
Ketones, ur: NEGATIVE mg/dL
Protein, ur: 100 mg/dL — AB
Urobilinogen, UA: 1 mg/dL (ref 0.0–1.0)

## 2012-09-01 LAB — PROTIME-INR
INR: 4.25 — ABNORMAL HIGH (ref 0.00–1.49)
Prothrombin Time: 39.2 seconds — ABNORMAL HIGH (ref 11.6–15.2)

## 2012-09-01 LAB — BASIC METABOLIC PANEL
BUN: 74 mg/dL — ABNORMAL HIGH (ref 6–23)
Creatinine, Ser: 1.97 mg/dL — ABNORMAL HIGH (ref 0.50–1.35)
GFR calc Af Amer: 33 mL/min — ABNORMAL LOW (ref >90)
GFR calc non Af Amer: 29 mL/min — ABNORMAL LOW (ref >90)
Potassium: 3.7 mEq/L (ref 3.5–5.1)

## 2012-09-01 LAB — GLUCOSE, CAPILLARY: Glucose-Capillary: 137 mg/dL — ABNORMAL HIGH (ref 70–99)

## 2012-09-01 LAB — URINE MICROSCOPIC-ADD ON

## 2012-09-01 LAB — APTT: aPTT: 68 seconds — ABNORMAL HIGH (ref 24–37)

## 2012-09-01 MED ORDER — INSULIN ASPART 100 UNIT/ML ~~LOC~~ SOLN
0.0000 [IU] | Freq: Every day | SUBCUTANEOUS | Status: DC
  Administered 2012-09-01: 3 [IU] via SUBCUTANEOUS
  Administered 2012-09-02 – 2012-09-03 (×2): 2 [IU] via SUBCUTANEOUS

## 2012-09-01 MED ORDER — HYDRALAZINE HCL 10 MG PO TABS
10.0000 mg | ORAL_TABLET | Freq: Four times a day (QID) | ORAL | Status: DC
  Administered 2012-09-01 – 2012-09-02 (×6): 10 mg via ORAL
  Filled 2012-09-01 (×10): qty 1

## 2012-09-01 MED ORDER — INSULIN ASPART 100 UNIT/ML ~~LOC~~ SOLN
0.0000 [IU] | Freq: Three times a day (TID) | SUBCUTANEOUS | Status: DC
  Administered 2012-09-02: 11 [IU] via SUBCUTANEOUS
  Administered 2012-09-02: 4 [IU] via SUBCUTANEOUS
  Administered 2012-09-02 – 2012-09-03 (×3): 7 [IU] via SUBCUTANEOUS
  Administered 2012-09-03: 4 [IU] via SUBCUTANEOUS
  Administered 2012-09-04: 11 [IU] via SUBCUTANEOUS
  Administered 2012-09-04: 4 [IU] via SUBCUTANEOUS
  Administered 2012-09-04: 15 [IU] via SUBCUTANEOUS
  Administered 2012-09-05: 3 [IU] via SUBCUTANEOUS

## 2012-09-01 NOTE — Progress Notes (Signed)
Patient ID: Wayne Montoya  male  WUJ:[id number]    DOB: September 10, 1922    DOA: 08/26/2012  PCP: Wilmer Floor., MD  Assessment/Plan:  Acute respiratory failure with hypoxia due to Pleural effusion, left (recurrent)  - Currently stable, O2 sats in 90s on room air  - IR could not perform thoracentesis, per pulmonary, if clinically needed post op, repeat chest x-ray and can perform thoracentesis  -Lasix was dc'd since pt clinically dry/NPO  - Will repeat chest x-ray on Monday to assess if the pleural effusion is worsening or stable.  C2 cervical fracture (old) with subluxation  -NS following -no indication clinically to repair old fx  -cont hard cervical collar   Dehydration  -has started to tolerate full liquids, advance to regular diet today  Intertrochanteric fracture of right hip/post ORIF 7/16  -Ortho following (Dr Charlann Boxer)   DIABETES MELLITUS, TYPE II  -CBG controlled,cont SSI  - Placed on carb modified diet  Atrial fibrillation / 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  -rate controlled, on chronic coumadin but has known HIT so was bridging with Argatroban but held due to supra therapeutic INR - > 5 7/18-management per pharmacy  - watch since BUN increase could be due to occult bleeding/watch Hgb  -Coumadin was resumed and Pharmacist is trying to confirm actual INR OFF Argatroban since Argatroban can cause FALSE elevations.  -INR today 4.25, After long discussion with patient's son today, weighing the risk and benefits of Coumadin especially with his high fall risk and prior history of GI bleed, he requested to hold off Coumadin. He understands the risk of stroke due to atrial fibrillation and not on anticoagulation.  CKD (chronic kidney disease) stage 3, GFR 30-59 ml/min  -stable   HYPERTENSION  -BP stable today  CAD, Coronary ARTERY BYPASS GRAFT  -no sx's, moderate CV surgical risk per Cards evaluation  -cont BB    Chronic systolic heart failure/EF 30%   -compensated, home Lasix on hold   Right ventricular dilation  -as above   DVT prophylaxis: Argatroban  Code Status:  Disposition: Will likely need skilled nursing facility    Subjective: No complaints per the patient  Objective: Weight change:   Intake/Output Summary (Last 24 hours) at 09/01/12 1154 Last data filed at 09/01/12 1056  Gross per 24 hour  Intake    195 ml  Output    730 ml  Net   -535 ml   Blood pressure 124/58, pulse 90, temperature 97.5 F (36.4 C), temperature source Oral, resp. rate 18, height 5\' 10"  (1.778 m), weight 91.1 kg (200 lb 13.4 oz), SpO2 92.00%.  Physical Exam: General: Alert and awake, not in any acute distress. CVS: Irregularly irregular Chest: clear to auscultation bilaterally, anteriorly no wheezing, rales or rhonchi Abdomen: soft nontender, nondistended, normal bowel sounds  Extremities: no cyanosis, clubbing bilaterally  Lab Results: Basic Metabolic Panel:  Recent Labs Lab 08/31/12 0215 09/01/12 0600  NA 145 141  K 3.4* 3.7  CL 109 108  CO2 29 25  GLUCOSE 133* 156*  BUN 63* 74*  CREATININE 2.10* 1.97*  CALCIUM 9.3 8.8   Liver Function Tests:  Recent Labs Lab 08/28/12 0410 08/30/12 0545  AST 17 15  ALT 10 8  ALKPHOS 65 57  BILITOT 1.2 1.2  PROT 6.9 6.3  ALBUMIN 3.1* 2.3*   No results found for this basename: LIPASE, AMYLASE,  in the last 168 hours No results found for this basename: AMMONIA,  in the last 168 hours CBC:  Recent  Labs Lab 08/31/12 2240 09/01/12 0745  WBC 9.2 8.2  HGB 9.6* 9.6*  HCT 29.8* 29.5*  MCV 82.5 82.2  PLT 188 184   Cardiac Enzymes: No results found for this basename: CKTOTAL, CKMB, CKMBINDEX, TROPONINI,  in the last 168 hours BNP: No components found with this basename: POCBNP,  CBG:  Recent Labs Lab 08/31/12 1608 08/31/12 2015 08/31/12 2348 09/01/12 0343 09/01/12 0808  GLUCAP 192* 234* 162* 137* 178*     Micro Results: Recent Results (from the past 240 hour(s))   MRSA PCR SCREENING     Status: None   Collection Time    08/26/12  9:49 AM      Result Value Range Status   MRSA by PCR NEGATIVE  NEGATIVE Final   Comment:            The GeneXpert MRSA Assay (FDA     approved for NASAL specimens     only), is one component of a     comprehensive MRSA colonization     surveillance program. It is not     intended to diagnose MRSA     infection nor to guide or     monitor treatment for     MRSA infections.    Studies/Results: Dg Chest 1 View  08/26/2012   *RADIOLOGY REPORT*  Clinical Data: Right hip fracture.  CHEST - 1 VIEW  Comparison: 08/16/2012  Findings: Backboard artifact.  Stable appearance of postoperative changes in the mediastinum.  Cardiac enlargement with pulmonary vascular congestion.  Left pleural effusion is increasing in size, possibly due to the supine technique.  Atelectasis or consolidation in the left lung base.  IMPRESSION: Cardiac enlargement with left pleural effusion and atelectasis or infiltration in the left lung base.   Original Report Authenticated By: Burman Nieves, M.D.   Dg Chest 2 View  08/16/2012   *RADIOLOGY REPORT*  Clinical Data: Heart failure, history CHF, coronary artery disease post CABG, hypertension, diabetes, chronic renal insufficiency  CHEST - 2 VIEW  Comparison: 06/28/2012  Findings: Enlargement of cardiac silhouette post CABG. Pulmonary vascular congestion Calcified tortuous aorta. Moderate left pleural effusion with significant compressive atelectasis of lower left lung. Underlying infiltrate not excluded in this setting. Slight chronic accentuation perihilar markings grossly unchanged. Remaining lungs clear.  IMPRESSION: Enlargement of cardiac silhouette with pulmonary vascular congestion. Significant increase in left pleural effusion and left basilar atelectasis since prior exam.   Original Report Authenticated By: Ulyses Southward, M.D.   Dg Hip 1 View Right  08/26/2012   *RADIOLOGY REPORT*  Clinical Data: Right  hip pain after fall.  RIGHT HIP - 1 VIEW  Comparison: 08/26/2012 at 0526 hours.  Findings: Comminuted intertrochanteric fracture of the right hip with varus angulation, impaction, and displacement of the lesser trochanteric fragment.  No subluxation of the hip joints.  The pelvis appears intact.  No focal bone lesion.  Vascular calcifications.  IMPRESSION: Comminuted intertrochanteric fracture of the right hip with varus angulation and displaced lesser trochanteric fragment.   Original Report Authenticated By: Burman Nieves, M.D.   Dg Hip Complete Right  08/26/2012   *RADIOLOGY REPORT*  Clinical Data: Right-sided hip pain after fall.  RIGHT HIP - COMPLETE 2+ VIEW  Comparison: None.  Findings: Backboard artifact.  There is a comminuted fracture of the intertrochanteric region of the right hip with varus angulation of the hip and impaction and displacement of fracture fragments. Visualized pelvis, SI joints, and symphysis pubis appear intact. Degenerative changes in the lower lumbar spine and  hips.  Vascular calcifications.  Diffuse bone demineralization.  IMPRESSION: Comminuted intertrochanteric fracture of the right hip with varus angulation and displacement of the lesser trochanteric fragment.   Original Report Authenticated By: Burman Nieves, M.D.   Dg Hip Operative Right  08/29/2012   *RADIOLOGY REPORT*  Clinical Data: Right hip fracture.  DG OPERATIVE RIGHT HIP  Comparison: 08/26/2012 radiographs.  Findings: Three spot fluoroscopic images of the right hip demonstrate compression screw and intramedullary nail fixation of the intertrochanteric right femur fracture.  There is near anatomic reduction of the main fracture fragments.  Hardware appears well positioned.  There is no dislocation.  IMPRESSION: Improved alignment of the intertrochanteric right femur fracture status post ORIF.   Original Report Authenticated By: Carey Bullocks, M.D.   Ct Head Wo Contrast  08/26/2012   *RADIOLOGY REPORT*   Clinical Data: The patient tripped and fell onto the right hip. Coumadin.  CT HEAD WITHOUT CONTRAST  Technique:  Contiguous axial images were obtained from the base of the skull through the vertex without contrast.  Comparison: 06/25/2012  Findings: Diffuse cerebral atrophy.  Low attenuation changes in the deep white matter consistent with central atrophy.  No ventricular dilatation.  Old lacunar infarct in the left basal ganglion.  No mass effect or midline shift.  No abnormal extra-axial fluid collections.  Gray-white matter junctions are distinct.  Basal cisterns are not effaced.  No evidence of acute intracranial hemorrhage.  Prominent vertebral basilar and carotid calcifications.  Small soft tissue hematoma over the right anterior frontal region.  Opacification of the left maxillary antrum is likely inflammatory.  Sinuses demonstrate clearing since previous study.  IMPRESSION: No acute intracranial abnormalities.  Chronic atrophy and small vessel ischemic changes.   Original Report Authenticated By: Burman Nieves, M.D.   Ct Chest Wo Contrast  08/26/2012   *RADIOLOGY REPORT*  Clinical Data: Evaluate for pleural effusion.  Fall with cervical spine and hip fractures.  CT CHEST WITHOUT CONTRAST  Technique:  Multidetector CT imaging of the chest was performed following the standard protocol without IV contrast.  Comparison: 06/27/2012.  Findings:  THORACIC INLET/BODY WALL:  Gynecomastia. Small thyroid nodules, considered incidental.  MEDIASTINUM:  Chronic cardiomegaly.  No pericardial effusion.  Extensive coronary atherosclerotic calcification, status post CABG.  Diffuse aortic and great vessel atherosclerotic calcification, without evidence of acute abnormality.  There mildly enlarged lymph nodes, most notably in the AP window, lower paratracheal, subcarinal stations.  These are largely stable from prior and likely reactive.  LUNG WINDOWS:  Water density left pleural effusion which is mainly layering. Effusion  is moderate in volume, although larger than previous. Compressive atelectasis of the left lower lobe and lingula.  No consolidation or edema detected.  Mild dependent atelectasis on the right.  UPPER ABDOMEN:  Cholecystectomy.  OSSEOUS:  No acute fracture.  Advanced left glenohumeral osteoarthritis with loose bodies.  IMPRESSION:   Moderate, simple left pleural effusion, mildly larger than 06/27/2012. There is compressive atelectasis of the left lower lobe and lingula.   Original Report Authenticated By: Tiburcio Pea   Ct Cervical Spine Wo Contrast  08/26/2012   *RADIOLOGY REPORT*  Clinical Data: The patient tripped and fell.  CT CERVICAL SPINE WITHOUT CONTRAST  Technique:  Multidetector CT imaging of the cervical spine was performed. Multiplanar CT image reconstructions were also generated.  Comparison: 12/15/2008  Findings: There is a nonunited fracture of the base of the odontoid process with distraction of fracture fragments and with mild posterior subluxation of the odontoid fragment with respect  to the base.  The fracture was demonstrated on the previous study but displacement and distraction was not present at that time.  The margins appear well corticated and this is likely chronic change although acute displacement is not entirely excluded.  Remainder of the cervical vertebrae and the facet joints are otherwise well- aligned.  Degenerative changes in the cervical spine with narrowed cervical interspaces and associated endplate hypertrophic changes throughout.  Postoperative versus degenerative fusion at C5-6. Degenerative changes are progressive since the previous study.  No vertebral compression deformities.  No prevertebral soft tissue swelling.  Lateral masses of C1 appear symmetrical.  Vascular calcifications in the cervical carotid vessels.  Incidental note of large pleural effusion versus pleural thickening in the left apex and vascular prominence in the upper lungs suggesting vascular  congestion.  Calcification of the aorta.  IMPRESSION: Old appearing ununited fracture of the base of the odontoid process of C2.  There is posterior subluxation of the odontoid process with respect to the body of C2 which is developed since the previous study but is likely chronic.  Diffuse degenerative changes.  No acute fractures are demonstrated in the cervical spine.  Large left pleural effusion versus pleural thickening noted in the apex.   Original Report Authenticated By: Burman Nieves, M.D.   [place] Chest  08/28/2012   *RADIOLOGY REPORT*  Clinical Data: Hip fracture.  Left pleural effusion.  CHEST ULTRASOUND  Comparison: CT 08/26/2012 and earlier studies  Findings: Survey ultrasound of the left hemithorax was performed. Moderate pleural effusion was demonstrated.  However, because of patient positioning limitations secondary to his pain and mobility restrictions, a safe window for percutaneous thoracentesis could not be achieved.  IMPRESSION:  1.  Moderate left pleural effusion.  No safe access could be achieved for thoracentesis due to pain and mobility issues.  We would be happy re-attempt once these issues are controlled.   Original Report Authenticated By: D. Andria Rhein, MD   Dg Chest Port 1 View  08/31/2012   *RADIOLOGY REPORT*  Clinical Data: Follow-up evaluation for pleural effusion.  PORTABLE CHEST - 1 VIEW  Comparison: Chest x-ray 08/29/2012.  Findings: Previously noted endotracheal and nasogastric tubes have been removed.  Lung volumes remain low.  There continues to be complete opacification at the base of the left hemithorax, compatible with areas of atelectasis and/or consolidation, with superimposed moderate left-sided pleural effusion which is essentially unchanged allowing for slight differences in patient positioning.  Mild crowding of the pulmonary vasculature, accentuated by low lung volumes, without frank pulmonary edema. Heart size appears enlarged (unchanged).  Upper mediastinal  contours are distorted by low lung volumes, kyphotic positioning and slight rotation to the left.  Atherosclerosis in the thoracic aorta.  Status post median sternotomy for CABG.  IMPRESSION: 1.  Support apparatus and postoperative changes, as above. 2.  Low lung volumes with persistent atelectasis and/or consolidation in the left lower lobe and moderate left-sided pleural effusion which is unchanged. 3.  Atherosclerosis.   Original Report Authenticated By: Trudie Reed, M.D.   Dg Chest Port 1 View  08/29/2012   *RADIOLOGY REPORT*  Clinical Data: Intubated.  PORTABLE CHEST - 1 VIEW  Comparison: 08/28/2012  Findings: Endotracheal tube is 2.8 cm above the carina. Nasogastric tube is in the upper stomach region.  Left basilar densities are most compatible with pleural effusion and consolidation. Haziness at the right lung base may represent atelectasis.  Heart size appears to be upper limits of normal.  No evidence for a pneumothorax.  IMPRESSION: Support apparatuses as described.  Nasogastric tube in the upper stomach region.  Left basilar densities are suggestive for pleural fluid and consolidation.   Original Report Authenticated By: Richarda Overlie, M.D.   Dg Chest Port 1 View  08/28/2012   *RADIOLOGY REPORT*  Clinical Data: Follow up pleural effusion.  Shortness of breath.  PORTABLE CHEST - 1 VIEW  Comparison: Chest x-ray 08/26/2012.  Findings: No significant change in a large left-sided pleural effusion.  The majority of this pleural fluid is now throughout the mid and lower left hemithorax, with less fluid layering around the upper lung (this is likely positional as today's study is semi erect versus supine on the prior).  Extensive opacities throughout the left lung, particularly at the left base, likely reflect a combination of atelectasis and/or consolidation.  Mild pulmonary venous congestion, without frank pulmonary edema.  Mild cardiomegaly is unchanged. The patient is rotated to the right on today's  exam, resulting in distortion of the mediastinal contours and reduced diagnostic sensitivity and specificity for mediastinal pathology.  Atherosclerosis in the thoracic aorta.  Status post median sternotomy for CABG.  Surgical clips project over the right upper quadrant of the abdomen, likely from prior cholecystectomy. Advanced degenerative changes of osteoarthritis are incidentally noted in the left glenohumeral joint.  IMPRESSION: 1.  Allowing for differences in patient positioning, the radiographic appearance of chest is essentially unchanged, as detailed above.   Original Report Authenticated By: Trudie Reed, M.D.   Dg Chest Portable 1 View  08/26/2012   *RADIOLOGY REPORT*  Clinical Data: Hip pain after fall.  PORTABLE CHEST - 1 VIEW  Comparison: 08/26/2012 at 0531 hours  Findings: Shallow inspiration.  Cardiac enlargement with mild pulmonary vascular congestion.  Moderate to large left pleural effusion with consolidation in the left lung base.  No significant change.  IMPRESSION: Cardiac enlargement with pulmonary vascular congestion.  Left pleural effusion and basilar consolidation or atelectasis.   Original Report Authenticated By: Burman Nieves, M.D.    Medications: Scheduled Meds: . antiseptic oral rinse  15 mL Mouth Rinse q12n4p  . hydrALAZINE  10 mg Oral Q6H  . insulin aspart  0-20 Units Subcutaneous Q4H  . labetalol  100 mg Oral BID  . nitroGLYCERIN  0.2 mg Transdermal Daily      LOS: 6 days   Melanie Openshaw M.D. [facility] 09/01/2012, 11:54 AM Pager: [phone]  If 7PM-7AM, please contact night-coverage [email or web address] Password [email or web address]

## 2012-09-01 NOTE — Evaluation (Signed)
Physical Therapy Evaluation Patient Details Name: Wayne Montoya MRN: [id number] DOB: 1922-04-04 Today's Date: 09/01/2012 Time: 1478-2956 PT Time Calculation (min): 18 min  PT Assessment / Plan / Recommendation History of Present Illness  s/p ORIF R femur; hx AFIB, L pleural effusion, CVA and was adm in May 2014 due to PNA  Clinical Impression  Patient is s/p ORIF R femur surgery resulting in functional limitations due to the deficits listed below (see PT Problem List). Pt with extensive PMH. Patient will benefit from skilled PT to increase their independence and safety with mobility to allow discharge to the venue listed below.      PT Assessment  Patient needs continued PT services    Follow Up Recommendations  SNF;Supervision/Assistance - 24 hour    Does the patient have the potential to tolerate intense rehabilitation      Barriers to Discharge        Equipment Recommendations  None recommended by PT (TBD at SNF )    Recommendations for Other Services     Frequency Min 3X/week    Precautions / Restrictions Precautions Precautions: Fall Required Braces or Orthoses: Cervical Brace Cervical Brace: Hard collar;Other (comment) (MD please clarify) Restrictions Weight Bearing Restrictions: Yes RLE Weight Bearing: Partial weight bearing RLE Partial Weight Bearing Percentage or Pounds: 25%-50%   Pertinent Vitals/Pain Did not rate pain but would yell out with movement of R LE       Mobility  Bed Mobility Bed Mobility: Supine to Sit;Sitting - Scoot to Edge of Bed Supine to Sit: 1: +2 Total assist;HOB elevated;With rails Supine to Sit: Patient Percentage: 10% Sitting - Scoot to Edge of Bed: 1: +2 Total assist Sitting - Scoot to Edge of Bed: Patient Percentage: 10% Details for Bed Mobility Assistance: pt yells out in pain with (A) advancing to EOB; requires 2+ (A); decreased ability to follow one step commands; requires max encouragement to participate due to  pain Transfers Transfers: Squat Pivot Transfers Squat Pivot Transfers: From elevated surface;With armrests;With upper extremity assistance;Other (comment) (3+) Squat Pivot Transfers: Patient Percentage: 10% Details for Transfer Assistance: pt required 3+ (A) for safety; requires (A) to maintain PWB status on R LE due to decreased cognition; pt demo ability to WB through L LE minimally and kept stating " i cannot stand"; max encouraement to participate; use of bed pad to complete transfer with a person guiding trunk posteriorl and(A) on each side of pt Ambulation/Gait Ambulation/Gait Assistance: Not tested (comment) Stairs: No Wheelchair Mobility Wheelchair Mobility: No    Exercises General Exercises - Lower Extremity Ankle Circles/Pumps: Both;10 reps;AAROM;Supine   PT Diagnosis: Acute pain;Generalized weakness;Difficulty walking  PT Problem List: Decreased activity tolerance;Decreased strength;Decreased range of motion;Decreased balance;Decreased mobility;Decreased knowledge of use of DME;Decreased safety awareness;Decreased cognition;Pain PT Treatment Interventions: DME instruction;Gait training;Functional mobility training;Therapeutic activities;Therapeutic exercise;Balance training;Neuromuscular re-education;Patient/family education;Wheelchair mobility training     PT Goals(Current goals can be found in the care plan section) Acute Rehab PT Goals Patient Stated Goal: ' i got 2 weddings to go to" PT Goal Formulation: With patient Time For Goal Achievement: 09/08/12 Potential to Achieve Goals: Fair  Visit Information  Last PT Received On: 09/01/12 Assistance Needed: +3 or more History of Present Illness: s/p ORIF R femur; hx AFIB, L pleural effusion, CVA and was adm in May 2014 due to PNA       Prior Functioning  Home Living Family/patient expects to be discharged to:: Skilled nursing facility Additional Comments: unable to reach Son via telephone so PLOF  unclear; per MD note pt  was living ALF and indepedent with mobility  Prior Function Level of Independence: Independent with assistive device(s) Comments: no family present or available; attempted to telephone contact son, Wayne Montoya. per tech family reports pt has had worsening dementia the past 2 years Communication Communication: [facility]    Cognition  Cognition Arousal/Alertness: Awake/alert Behavior During Therapy: WFL for tasks assessed/performed Overall Cognitive Status: History of cognitive impairments - at baseline    Extremity/Trunk Assessment Upper Extremity Assessment Upper Extremity Assessment: Defer to OT evaluation Lower Extremity Assessment Lower Extremity Assessment: Difficult to assess due to impaired cognition;RLE deficits/detail RLE Deficits / Details: pt with difficulty following commands; unable to assess R LE strength due to pain and cognition RLE: Unable to fully assess due to pain RLE Sensation:  (responds to light touch) Cervical / Trunk Assessment Cervical / Trunk Assessment: Kyphotic   Balance Balance Balance Assessed: Yes Static Sitting Balance Static Sitting - Balance Support: Bilateral upper extremity supported;Feet supported Static Sitting - Level of Assistance: 4: Min assist;5: Stand by assistance Static Sitting - Comment/# of Minutes: pt progressed to stand by (A); initally required min (A) to maintain balance; has tendency to push posteriorly; tolerated sitting EOB ~4 min   End of Session PT - End of Session Equipment Utilized During Treatment: Gait belt Activity Tolerance: Patient limited by fatigue;Patient limited by pain Patient left: in chair;with call bell/phone within reach;with nursing/sitter in room Nurse Communication: Mobility status;Other (comment) (Tech present to witness technique )  GP     Shelva Majestic [place], [place] [phone] 09/01/2012, 2:53 PM

## 2012-09-01 NOTE — Evaluation (Signed)
Clinical/Bedside Swallow Evaluation Patient Details  Name: Wayne Montoya MRN: [id number] Date of Birth: 22-Apr-1922  Today's Date: 09/01/2012 Time: 1120-1130 SLP Time Calculation (min): 10 min  Past Medical History:  Past Medical History  Diagnosis Date  . CHF (congestive heart failure)     secondary to diastolic function. Echo 4/09: EF 55%  . CAD (coronary artery disease)     status post CABG in 1996  . Paroxysmal atrial fibrillation   . Diabetes mellitus     type was not specified  . HTN (hypertension)   . Hyperlipidemia   . Chronic renal insufficiency     with a base line creatinine of 1.6  . Cerebrovascular accident     Hx of it, Left sided facial droop  . Depression   . Carotid bruit     s/p carotids in 2006, showed mild hard plaque in the bulbs and proximal bifurcations, left greater that right. Both ICAs take steep posterior dives. Right ICA 0-39% stenosis; left ICA 40-59% stenosis.  Carrotids 9/20: 0-39%  . BPH (benign prostatic hyperplasia)     Hx of it.  . S/P cholecystectomy   . Fall     C2 fracture/ 12/15/08  . Dementia    Past Surgical History:  Past Surgical History  Procedure Laterality Date  . Coronary artery bypass graft     HPI:  79yo male SNF resident with mult medical problems including CHF, AFib on coumadin, chronic renal insufficiency, hx CVA, Chronic C2 fracture with new C2 subluxation,  admitted by [facility] 7/13 for R. Hip Fx after fall . Found to have recurrent Left Pleural effusion.  7/16 R IM nail femur.  Intubated for surgery 24 hours; post-op confusion/agitation.  On full liquid diet.   Assessment / Plan / Recommendation Clinical Impression  Pt currently on full liquid diet.  Swallow eval reveals appropriate oral manipulation, timely swallow trigger, no overt indication of compromised airway protection with liquids or purees.  REC advancing diet per MD; no SLP needs identified.  Will sign off.        Diet Recommendation Regular;Thin liquid (when  ready to advance diet per MD)   Liquid Administration via: Cup;Straw Medication Administration: Whole meds with liquid    Other  Recommendations Oral Care Recommendations: Oral care BID   Follow Up Recommendations  None           SLP Swallow Goals  n/a   Swallow Study Prior Functional Status       General Date of Onset: 08/26/12 H Type of Study: Bedside swallow evaluation Diet Prior to this Study:  (full liquid diet) Temperature Spikes Noted: No Respiratory Status: Room air History of Recent Intubation: Yes Length of Intubations (days): 1 days Date extubated: 08/30/12 Behavior/Cognition: Alert;Confused Oral Cavity - Dentition: Dentures, top;Dentures, bottom Self-Feeding Abilities: Able to feed self Patient Positioning: Upright in bed Baseline Vocal Quality: Clear Volitional Cough: Strong Volitional Swallow: Able to elicit    Oral/Motor/Sensory Function Overall Oral Motor/Sensory Function: Impaired at baseline ; right CN VII  Ice Chips Ice chips: Within functional limits Presentation: Spoon   Thin Liquid Thin Liquid: Within functional limits Presentation: Straw    Nectar Thick Nectar Thick Liquid: Not tested   Honey Thick Honey Thick Liquid: Not tested   Puree Puree: Within functional limits Presentation: Spoon   Solid       Solid: Not tested -diet restricted s/p surgery.       Blenda Mounts Laurice 09/01/2012,11:37 AM

## 2012-09-02 LAB — CBC
Hemoglobin: 8.8 g/dL — ABNORMAL LOW (ref 13.0–17.0)
MCH: 26.5 pg (ref 26.0–34.0)
MCHC: 32.6 g/dL (ref 30.0–36.0)
MCV: 81.3 fL (ref 78.0–100.0)
RBC: 3.32 MIL/uL — ABNORMAL LOW (ref 4.22–5.81)

## 2012-09-02 LAB — BASIC METABOLIC PANEL
BUN: 75 mg/dL — ABNORMAL HIGH (ref 6–23)
CO2: 24 mEq/L (ref 19–32)
Calcium: 8.7 mg/dL (ref 8.4–10.5)
Glucose, Bld: 221 mg/dL — ABNORMAL HIGH (ref 70–99)
Potassium: 3.7 mEq/L (ref 3.5–5.1)
Sodium: 136 mEq/L (ref 135–145)

## 2012-09-02 LAB — GLUCOSE, CAPILLARY: Glucose-Capillary: 274 mg/dL — ABNORMAL HIGH (ref 70–99)

## 2012-09-02 LAB — URINE CULTURE
Colony Count: NO GROWTH
Culture: NO GROWTH

## 2012-09-02 MED ORDER — INSULIN GLARGINE 100 UNIT/ML ~~LOC~~ SOLN
20.0000 [IU] | Freq: Every day | SUBCUTANEOUS | Status: DC
  Administered 2012-09-02 – 2012-09-03 (×2): 20 [IU] via SUBCUTANEOUS
  Filled 2012-09-02 (×2): qty 0.2

## 2012-09-02 MED ORDER — FUROSEMIDE 40 MG PO TABS
40.0000 mg | ORAL_TABLET | Freq: Every day | ORAL | Status: DC
  Administered 2012-09-02 – 2012-09-05 (×4): 40 mg via ORAL
  Filled 2012-09-02 (×4): qty 1

## 2012-09-02 MED ORDER — ENSURE COMPLETE PO LIQD
237.0000 mL | Freq: Two times a day (BID) | ORAL | Status: DC
  Administered 2012-09-02 – 2012-09-03 (×3): 237 mL via ORAL

## 2012-09-02 MED ORDER — LABETALOL HCL 5 MG/ML IV SOLN
10.0000 mg | Freq: Four times a day (QID) | INTRAVENOUS | Status: DC | PRN
  Filled 2012-09-02: qty 4

## 2012-09-02 MED ORDER — GUAIFENESIN ER 600 MG PO TB12
600.0000 mg | ORAL_TABLET | Freq: Two times a day (BID) | ORAL | Status: DC
  Administered 2012-09-02 – 2012-09-04 (×6): 600 mg via ORAL
  Filled 2012-09-02 (×8): qty 1

## 2012-09-02 NOTE — Progress Notes (Signed)
Patient ID: Wayne Montoya  male  ION:[id number]    DOB: Dec 17, 1922    DOA: 08/26/2012  PCP: Wilmer Floor., MD  Assessment/Plan:  Acute respiratory failure with hypoxia due to Pleural effusion, left (recurrent)  - Currently stable, O2 sats in 90s on room air  - IR could not perform thoracentesis, per pulmonary, if clinically needed post op, repeat chest x-ray and can perform thoracentesis. Will repeat chest x-ray in a.m.  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  -Per my discussion with son, weighing the risk and benefits of Coumadin especially with his high fall risk and prior history of GI bleed, he requested to hold off Coumadin. He understands the risk of stroke due to atrial fibrillation and not on anticoagulation. Recheck INR.  CKD (chronic kidney disease) stage 3, GFR 30-59 ml/min  -stable   HYPERTENSION  -BP stable today  CAD, Coronary ARTERY BYPASS GRAFT  -no sx's, moderate CV surgical risk per Cards evaluation  -cont BB    Chronic systolic heart  failure/EF 30%  -compensated, restart Lasix, monitor creatinine function   Right ventricular dilation  -as above   DVT prophylaxis: Argatroban  Code Status:  Disposition: Will likely need skilled nursing facility    Subjective: No complaints per the patient   Objective: Weight change:   Intake/Output Summary (Last 24 hours) at 09/02/12 1211 Last data filed at 09/02/12 0800  Gross per 24 hour  Intake    240 ml  Output    600 ml  Net   -360 ml   Blood pressure 110/53, pulse 92, temperature 98.9 F (37.2 C), temperature source Oral, resp. rate 16, height 5\' 10"  (1.778 m), weight 91.1 kg (200 lb 13.4 oz), SpO2 97.00%.  Physical Exam: General: Alert and awake, not in any acute distress. CVS: Irregularly irregular Chest: Coarse BS bilaterally  Abdomen: soft nontender, nondistended, normal bowel sounds  Extremities: no cyanosis, clubbing bilaterally  Lab Results: Basic Metabolic Panel:  Recent Labs Lab 09/01/12 0600 09/02/12 0620  NA 141 136  K 3.7 3.7  CL 108 102  CO2 25 24  GLUCOSE 156* 221*  BUN 74* 75*  CREATININE 1.97* 2.02*  CALCIUM 8.8 8.7   Liver Function Tests:  Recent Labs Lab 08/28/12 0410 08/30/12 0545  AST 17 15  ALT 10 8  ALKPHOS 65 57  BILITOT 1.2 1.2  PROT 6.9 6.3  ALBUMIN 3.1* 2.3*   No results found for this basename: LIPASE, AMYLASE,  in the last 168 hours No results found for this basename: AMMONIA,  in the last 168 hours CBC:  Recent Labs Lab 09/01/12 1752 09/02/12 0810  WBC 8.5 7.3  HGB 9.2* 8.8*  HCT 28.4* 27.0*  MCV 81.4 81.3  PLT 197 201   Cardiac Enzymes: No results found for this basename: CKTOTAL, CKMB, CKMBINDEX, TROPONINI,  in the last 168 hours BNP: No components found with this basename: POCBNP,  CBG:  Recent Labs Lab 09/01/12 1235 09/01/12 1602 09/01/12 2013 09/02/12 0724 09/02/12 1127  GLUCAP 175* 240* 253* 220* 183*     Micro Results: Recent Results (from the past 240 hour(s))  MRSA PCR  SCREENING     Status: None   Collection Time    08/26/12  9:49 AM      Result Value Range Status   MRSA by PCR NEGATIVE  NEGATIVE Final   Comment:            The GeneXpert MRSA Assay (FDA     approved for NASAL specimens     only), is one component of a     comprehensive MRSA colonization     surveillance program. It is not     intended to diagnose MRSA     infection nor to guide or     monitor treatment for     MRSA infections.    Studies/Results: Dg Chest 1 View  08/26/2012   *RADIOLOGY REPORT*  Clinical Data: Right hip fracture.  CHEST - 1 VIEW  Comparison: 08/16/2012  Findings: Backboard artifact.  Stable appearance of postoperative changes in the mediastinum.  Cardiac enlargement with pulmonary vascular congestion.  Left pleural effusion is increasing in size, possibly due to the supine technique.  Atelectasis or consolidation in the left lung base.  IMPRESSION: Cardiac enlargement with left pleural effusion and atelectasis or infiltration in the left lung base.   Original Report Authenticated By: Burman Nieves, M.D.   Dg Chest 2 View  08/16/2012   *RADIOLOGY REPORT*  Clinical Data: Heart failure, history CHF, coronary artery disease post CABG, hypertension, diabetes, chronic renal insufficiency  CHEST - 2 VIEW  Comparison: 06/28/2012  Findings: Enlargement of cardiac silhouette post CABG. Pulmonary vascular congestion Calcified tortuous aorta. Moderate left pleural effusion with significant compressive atelectasis of lower left lung. Underlying infiltrate not excluded in this setting. Slight chronic accentuation perihilar markings grossly unchanged. Remaining lungs clear.  IMPRESSION: Enlargement of cardiac silhouette with pulmonary vascular congestion. Significant increase in left pleural effusion and left basilar atelectasis since prior exam.   Original Report Authenticated By: Ulyses Southward, M.D.   Dg Hip 1 View Right  08/26/2012   *RADIOLOGY REPORT*  Clinical Data: Right hip pain  after fall.  RIGHT HIP - 1 VIEW  Comparison: 08/26/2012 at 0526 hours.  Findings: Comminuted intertrochanteric fracture of the right hip with varus angulation, impaction, and displacement of the lesser trochanteric fragment.  No subluxation of the hip joints.  The pelvis appears intact.  No focal bone lesion.  Vascular calcifications.  IMPRESSION: Comminuted intertrochanteric fracture of the right hip with varus angulation and displaced lesser trochanteric fragment.   Original Report Authenticated By: Burman Nieves, M.D.   Dg Hip Complete Right  08/26/2012   *RADIOLOGY REPORT*  Clinical Data: Right-sided hip pain after fall.  RIGHT HIP - COMPLETE 2+ VIEW  Comparison: None.  Findings: Backboard artifact.  There is a comminuted fracture of the intertrochanteric region of the right hip with varus angulation of the hip and impaction and displacement of fracture fragments. Visualized pelvis, SI joints, and symphysis pubis appear intact. Degenerative changes in the lower lumbar spine  and hips.  Vascular calcifications.  Diffuse bone demineralization.  IMPRESSION: Comminuted intertrochanteric fracture of the right hip with varus angulation and displacement of the lesser trochanteric fragment.   Original Report Authenticated By: Burman Nieves, M.D.   Dg Hip Operative Right  08/29/2012   *RADIOLOGY REPORT*  Clinical Data: Right hip fracture.  DG OPERATIVE RIGHT HIP  Comparison: 08/26/2012 radiographs.  Findings: Three spot fluoroscopic images of the right hip demonstrate compression screw and intramedullary nail fixation of the intertrochanteric right femur fracture.  There is near anatomic reduction of the main fracture fragments.  Hardware appears well positioned.  There is no dislocation.  IMPRESSION: Improved alignment of the intertrochanteric right femur fracture status post ORIF.   Original Report Authenticated By: Carey Bullocks, M.D.   Ct Head Wo Contrast  08/26/2012   *RADIOLOGY REPORT*  Clinical Data:  The patient tripped and fell onto the right hip. Coumadin.  CT HEAD WITHOUT CONTRAST  Technique:  Contiguous axial images were obtained from the base of the skull through the vertex without contrast.  Comparison: 06/25/2012  Findings: Diffuse cerebral atrophy.  Low attenuation changes in the deep white matter consistent with central atrophy.  No ventricular dilatation.  Old lacunar infarct in the left basal ganglion.  No mass effect or midline shift.  No abnormal extra-axial fluid collections.  Gray-white matter junctions are distinct.  Basal cisterns are not effaced.  No evidence of acute intracranial hemorrhage.  Prominent vertebral basilar and carotid calcifications.  Small soft tissue hematoma over the right anterior frontal region.  Opacification of the left maxillary antrum is likely inflammatory.  Sinuses demonstrate clearing since previous study.  IMPRESSION: No acute intracranial abnormalities.  Chronic atrophy and small vessel ischemic changes.   Original Report Authenticated By: Burman Nieves, M.D.   Ct Chest Wo Contrast  08/26/2012   *RADIOLOGY REPORT*  Clinical Data: Evaluate for pleural effusion.  Fall with cervical spine and hip fractures.  CT CHEST WITHOUT CONTRAST  Technique:  Multidetector CT imaging of the chest was performed following the standard protocol without IV contrast.  Comparison: 06/27/2012.  Findings:  THORACIC INLET/BODY WALL:  Gynecomastia. Small thyroid nodules, considered incidental.  MEDIASTINUM:  Chronic cardiomegaly.  No pericardial effusion.  Extensive coronary atherosclerotic calcification, status post CABG.  Diffuse aortic and great vessel atherosclerotic calcification, without evidence of acute abnormality.  There mildly enlarged lymph nodes, most notably in the AP window, lower paratracheal, subcarinal stations.  These are largely stable from prior and likely reactive.  LUNG WINDOWS:  Water density left pleural effusion which is mainly layering. Effusion is moderate in  volume, although larger than previous. Compressive atelectasis of the left lower lobe and lingula.  No consolidation or edema detected.  Mild dependent atelectasis on the right.  UPPER ABDOMEN:  Cholecystectomy.  OSSEOUS:  No acute fracture.  Advanced left glenohumeral osteoarthritis with loose bodies.  IMPRESSION:   Moderate, simple left pleural effusion, mildly larger than 06/27/2012. There is compressive atelectasis of the left lower lobe and lingula.   Original Report Authenticated By: Tiburcio Pea   Ct Cervical Spine Wo Contrast  08/26/2012   *RADIOLOGY REPORT*  Clinical Data: The patient tripped and fell.  CT CERVICAL SPINE WITHOUT CONTRAST  Technique:  Multidetector CT imaging of the cervical spine was performed. Multiplanar CT image reconstructions were also generated.  Comparison: 12/15/2008  Findings: There is a nonunited fracture of the base of the odontoid process with distraction of fracture fragments and with mild posterior subluxation of the odontoid fragment with  respect to the base.  The fracture was demonstrated on the previous study but displacement and distraction was not present at that time.  The margins appear well corticated and this is likely chronic change although acute displacement is not entirely excluded.  Remainder of the cervical vertebrae and the facet joints are otherwise well- aligned.  Degenerative changes in the cervical spine with narrowed cervical interspaces and associated endplate hypertrophic changes throughout.  Postoperative versus degenerative fusion at C5-6. Degenerative changes are progressive since the previous study.  No vertebral compression deformities.  No prevertebral soft tissue swelling.  Lateral masses of C1 appear symmetrical.  Vascular calcifications in the cervical carotid vessels.  Incidental note of large pleural effusion versus pleural thickening in the left apex and vascular prominence in the upper lungs suggesting vascular congestion.   Calcification of the aorta.  IMPRESSION: Old appearing ununited fracture of the base of the odontoid process of C2.  There is posterior subluxation of the odontoid process with respect to the body of C2 which is developed since the previous study but is likely chronic.  Diffuse degenerative changes.  No acute fractures are demonstrated in the cervical spine.  Large left pleural effusion versus pleural thickening noted in the apex.   Original Report Authenticated By: Burman Nieves, M.D.   [place] Chest  08/28/2012   *RADIOLOGY REPORT*  Clinical Data: Hip fracture.  Left pleural effusion.  CHEST ULTRASOUND  Comparison: CT 08/26/2012 and earlier studies  Findings: Survey ultrasound of the left hemithorax was performed. Moderate pleural effusion was demonstrated.  However, because of patient positioning limitations secondary to his pain and mobility restrictions, a safe window for percutaneous thoracentesis could not be achieved.  IMPRESSION:  1.  Moderate left pleural effusion.  No safe access could be achieved for thoracentesis due to pain and mobility issues.  We would be happy re-attempt once these issues are controlled.   Original Report Authenticated By: D. Andria Rhein, MD   Dg Chest Port 1 View  08/31/2012   *RADIOLOGY REPORT*  Clinical Data: Follow-up evaluation for pleural effusion.  PORTABLE CHEST - 1 VIEW  Comparison: Chest x-ray 08/29/2012.  Findings: Previously noted endotracheal and nasogastric tubes have been removed.  Lung volumes remain low.  There continues to be complete opacification at the base of the left hemithorax, compatible with areas of atelectasis and/or consolidation, with superimposed moderate left-sided pleural effusion which is essentially unchanged allowing for slight differences in patient positioning.  Mild crowding of the pulmonary vasculature, accentuated by low lung volumes, without frank pulmonary edema. Heart size appears enlarged (unchanged).  Upper mediastinal contours are  distorted by low lung volumes, kyphotic positioning and slight rotation to the left.  Atherosclerosis in the thoracic aorta.  Status post median sternotomy for CABG.  IMPRESSION: 1.  Support apparatus and postoperative changes, as above. 2.  Low lung volumes with persistent atelectasis and/or consolidation in the left lower lobe and moderate left-sided pleural effusion which is unchanged. 3.  Atherosclerosis.   Original Report Authenticated By: Trudie Reed, M.D.   Dg Chest Port 1 View  08/29/2012   *RADIOLOGY REPORT*  Clinical Data: Intubated.  PORTABLE CHEST - 1 VIEW  Comparison: 08/28/2012  Findings: Endotracheal tube is 2.8 cm above the carina. Nasogastric tube is in the upper stomach region.  Left basilar densities are most compatible with pleural effusion and consolidation. Haziness at the right lung base may represent atelectasis.  Heart size appears to be upper limits of normal.  No evidence for a pneumothorax.  IMPRESSION: Support apparatuses as described.  Nasogastric tube in the upper stomach region.  Left basilar densities are suggestive for pleural fluid and consolidation.   Original Report Authenticated By: Richarda Overlie, M.D.   Dg Chest Port 1 View  08/28/2012   *RADIOLOGY REPORT*  Clinical Data: Follow up pleural effusion.  Shortness of breath.  PORTABLE CHEST - 1 VIEW  Comparison: Chest x-ray 08/26/2012.  Findings: No significant change in a large left-sided pleural effusion.  The majority of this pleural fluid is now throughout the mid and lower left hemithorax, with less fluid layering around the upper lung (this is likely positional as today's study is semi erect versus supine on the prior).  Extensive opacities throughout the left lung, particularly at the left base, likely reflect a combination of atelectasis and/or consolidation.  Mild pulmonary venous congestion, without frank pulmonary edema.  Mild cardiomegaly is unchanged. The patient is rotated to the right on today's exam, resulting  in distortion of the mediastinal contours and reduced diagnostic sensitivity and specificity for mediastinal pathology.  Atherosclerosis in the thoracic aorta.  Status post median sternotomy for CABG.  Surgical clips project over the right upper quadrant of the abdomen, likely from prior cholecystectomy. Advanced degenerative changes of osteoarthritis are incidentally noted in the left glenohumeral joint.  IMPRESSION: 1.  Allowing for differences in patient positioning, the radiographic appearance of chest is essentially unchanged, as detailed above.   Original Report Authenticated By: Trudie Reed, M.D.   Dg Chest Portable 1 View  08/26/2012   *RADIOLOGY REPORT*  Clinical Data: Hip pain after fall.  PORTABLE CHEST - 1 VIEW  Comparison: 08/26/2012 at 0531 hours  Findings: Shallow inspiration.  Cardiac enlargement with mild pulmonary vascular congestion.  Moderate to large left pleural effusion with consolidation in the left lung base.  No significant change.  IMPRESSION: Cardiac enlargement with pulmonary vascular congestion.  Left pleural effusion and basilar consolidation or atelectasis.   Original Report Authenticated By: Burman Nieves, M.D.    Medications: Scheduled Meds: . antiseptic oral rinse  15 mL Mouth Rinse q12n4p  . feeding supplement  237 mL Oral BID BM  . furosemide  40 mg Oral Daily  . guaiFENesin  600 mg Oral BID  . insulin aspart  0-20 Units Subcutaneous TID WC  . insulin aspart  0-5 Units Subcutaneous QHS  . nitroGLYCERIN  0.2 mg Transdermal Daily      LOS: 7 days   RAI,RIPUDEEP M.D. [facility] 09/02/2012, 12:11 PM Pager: [phone]  If 7PM-7AM, please contact night-coverage [email or web address] Password [email or web address]

## 2012-09-03 ENCOUNTER — Encounter (HOSPITAL_COMMUNITY): Payer: Self-pay | Admitting: Orthopedic Surgery

## 2012-09-03 ENCOUNTER — Inpatient Hospital Stay (HOSPITAL_COMMUNITY): Payer: Medicare Other

## 2012-09-03 DIAGNOSIS — N183 Chronic kidney disease, stage 3 (moderate): Secondary | ICD-10-CM

## 2012-09-03 DIAGNOSIS — I10 Essential (primary) hypertension: Secondary | ICD-10-CM

## 2012-09-03 DIAGNOSIS — J9600 Acute respiratory failure, unspecified whether with hypoxia or hypercapnia: Secondary | ICD-10-CM

## 2012-09-03 DIAGNOSIS — E119 Type 2 diabetes mellitus without complications: Secondary | ICD-10-CM

## 2012-09-03 LAB — PROTIME-INR
INR: 1.62 — ABNORMAL HIGH (ref 0.00–1.49)
Prothrombin Time: 18.8 seconds — ABNORMAL HIGH (ref 11.6–15.2)

## 2012-09-03 LAB — CBC
MCHC: 31.6 g/dL (ref 30.0–36.0)
Platelets: 155 10*3/uL (ref 150–400)
RDW: 16.1 % — ABNORMAL HIGH (ref 11.5–15.5)
WBC: 5.6 10*3/uL (ref 4.0–10.5)

## 2012-09-03 LAB — BASIC METABOLIC PANEL
CO2: 28 mEq/L (ref 19–32)
Chloride: 100 mEq/L (ref 96–112)
Glucose, Bld: 233 mg/dL — ABNORMAL HIGH (ref 70–99)
Sodium: 136 mEq/L (ref 135–145)

## 2012-09-03 LAB — GLUCOSE, CAPILLARY
Glucose-Capillary: 206 mg/dL — ABNORMAL HIGH (ref 70–99)
Glucose-Capillary: 225 mg/dL — ABNORMAL HIGH (ref 70–99)
Glucose-Capillary: 237 mg/dL — ABNORMAL HIGH (ref 70–99)

## 2012-09-03 MED ORDER — DOCUSATE SODIUM 100 MG PO CAPS
100.0000 mg | ORAL_CAPSULE | Freq: Two times a day (BID) | ORAL | Status: DC
  Administered 2012-09-03 – 2012-09-05 (×5): 100 mg via ORAL
  Filled 2012-09-03 (×6): qty 1

## 2012-09-03 MED ORDER — POLYETHYLENE GLYCOL 3350 17 G PO PACK: 17.0000 g | PACK | Freq: Every day | ORAL | Status: DC | PRN

## 2012-09-03 MED ORDER — INSULIN GLARGINE 100 UNIT/ML ~~LOC~~ SOLN
30.0000 [IU] | Freq: Every day | SUBCUTANEOUS | Status: DC
Start: 2012-09-04 — End: 2012-09-05
  Administered 2012-09-04 – 2012-09-05 (×2): 30 [IU] via SUBCUTANEOUS
  Filled 2012-09-03 (×2): qty 0.3

## 2012-09-03 MED ORDER — ENSURE COMPLETE PO LIQD
237.0000 mL | Freq: Three times a day (TID) | ORAL | Status: DC
  Administered 2012-09-03 – 2012-09-05 (×4): 237 mL via ORAL

## 2012-09-03 MED ORDER — ASPIRIN EC 325 MG PO TBEC
325.0000 mg | DELAYED_RELEASE_TABLET | Freq: Every day | ORAL | Status: DC
  Administered 2012-09-03 – 2012-09-05 (×3): 325 mg via ORAL
  Filled 2012-09-03 (×3): qty 1

## 2012-09-03 MED ORDER — HYDROCODONE-ACETAMINOPHEN 5-325 MG PO TABS: 1.0000 | ORAL_TABLET | Freq: Four times a day (QID) | ORAL | Status: DC | PRN

## 2012-09-03 MED ORDER — INSULIN ASPART 100 UNIT/ML ~~LOC~~ SOLN
4.0000 [IU] | Freq: Three times a day (TID) | SUBCUTANEOUS | Status: DC
  Administered 2012-09-03 – 2012-09-05 (×4): 4 [IU] via SUBCUTANEOUS

## 2012-09-03 NOTE — Progress Notes (Signed)
NUTRITION FOLLOW UP  Intervention:   1. Supplements; Ensure Complete po TID, each supplement provides 350 kcal and 13 grams of protein.  Nutrition Dx:   Inadequate oral intake, omission of energy dense foods; progressing  Monitor:  PO intake, supplement acceptance, weight trend  Assessment:   Pt admitted s/p fall with hip surgery. Per son, pt was eating well PTA- lives in an assisted living facility and participates in 3 meals/day.   Intake has been poor, < 50% of meals.   Height: Ht Readings from Last 1 Encounters:  08/26/12 5\' 10"  (1.778 m)    Weight Status:   Wt Readings from Last 1 Encounters:  08/31/12 200 lb 13.4 oz (91.1 kg)  Admission weight 204 lb   Re-estimated needs:  Kcal: 2020-2300 Protein: 90-115g Fluid: ~2.0 L/day  Skin: incision  Diet Order: Carb Control Meal Completion: 15-50%   Intake/Output Summary (Last 24 hours) at 09/03/12 1245 Last data filed at 09/03/12 0645  Gross per 24 hour  Intake    600 ml  Output   1200 ml  Net   -600 ml    Last BM: none documented.    Labs:   Recent Labs Lab 09/01/12 0600 09/02/12 0620 09/03/12 0535  NA 141 136 136  K 3.7 3.7 4.0  CL 108 102 100  CO2 25 24 28   BUN 74* 75* 70*  CREATININE 1.97* 2.02* 1.90*  CALCIUM 8.8 8.7 8.8  GLUCOSE 156* 221* 233*    CBG (last 3)   Recent Labs  09/02/12 2206 09/03/12 0643 09/03/12 1140  GLUCAP 250* 206* 225*   Lab Results  Component Value Date   HGBA1C 6.6* 06/23/2012   Scheduled Meds: . antiseptic oral rinse  15 mL Mouth Rinse q12n4p  . aspirin EC  325 mg Oral Daily  . docusate sodium  100 mg Oral BID  . feeding supplement  237 mL Oral TID WC  . furosemide  40 mg Oral Daily  . guaiFENesin  600 mg Oral BID  . insulin aspart  0-20 Units Subcutaneous TID WC  . insulin aspart  0-5 Units Subcutaneous QHS  . insulin aspart  4 Units Subcutaneous TID WC  . [START ON 09/04/2012] insulin glargine  30 Units Subcutaneous Daily  . nitroGLYCERIN  0.2 mg  Transdermal Daily    Continuous Infusions:    Loyce Dys, MS RD LDN Clinical Inpatient Dietitian Pager: [phone] Weekend/After hours pager: [phone]

## 2012-09-03 NOTE — Progress Notes (Signed)
Patient ID: Wayne Montoya  male  RUE:[id number]    DOB: [birth year]    DOA: 08/26/2012  PCP: Wilmer Floor., MD  Assessment/Plan:  Acute respiratory failure with hypoxia due to Pleural effusion, left (recurrent)  - Currently stable, O2 sats in 90s on room air  - IR could not perform thoracentesis, per pulmonary, if clinically needed post op, repeat chest x-ray and can perform thoracentesis. 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  -I again discussed with patient's son, Gildardo Tickner, weighing the risk and benefits of Coumadin especially with his high fall risk and prior history of GI bleed, he requested to discontinue Coumadin. He understands the risk of stroke due to atrial fibrillation and not on anticoagulation. INR today is 1.6. Per our discussion, placed patient on ASA 325mg  daily.   CKD (chronic kidney disease) stage 3, GFR 30-59 ml/min  -stable   HYPERTENSION  -BP stable today  CAD, Coronary ARTERY BYPASS GRAFT  -no sx's, moderate CV surgical risk per Cards evaluation  -cont BB    Chronic systolic heart failure/EF 30%   -compensated, restart Lasix, monitor creatinine function   Right ventricular dilation  -as above   DVT prophylaxis: Argatroban  Code Status:  Disposition: Will likely need skilled nursing facility    Subjective: No complaints per the patient, appears to be cheerful today   Objective: Weight change:   Intake/Output Summary (Last 24 hours) at 09/03/12 1155 Last data filed at 09/03/12 0645  Gross per 24 hour  Intake    720 ml  Output   1200 ml  Net   -480 ml   Blood pressure 131/60, pulse 69, temperature 98 F (36.7 C), temperature source Oral, resp. rate 14, height 5\' 10"  (1.778 m), weight 91.1 kg (200 lb 13.4 oz), SpO2 97.00%.  Physical Exam: General: Alert and awake, NAD. CVS: Irregularly irregular Chest: dec BS at bases  Abdomen: soft NT, ND, NBS  Extremities: no cyanosis, clubbing bilaterally, TED hoses on  Lab Results: Basic Metabolic Panel:  Recent Labs Lab 09/02/12 0620 09/03/12 0535  NA 136 136  K 3.7 4.0  CL 102 100  CO2 24 28  GLUCOSE 221* 233*  BUN 75* 70*  CREATININE 2.02* 1.90*  CALCIUM 8.7 8.8   Liver Function Tests:  Recent Labs Lab 08/28/12 0410 08/30/12 0545  AST 17 15  ALT 10 8  ALKPHOS 65 57  BILITOT 1.2 1.2  PROT 6.9 6.3  ALBUMIN 3.1* 2.3*   No results found for this basename: LIPASE, AMYLASE,  in the last 168 hours No results found for this basename: AMMONIA,  in the last 168 hours CBC:  Recent Labs Lab 09/02/12 0810 09/03/12 0535  WBC 7.3 5.6  HGB 8.8* 8.9*  HCT 27.0* 28.2*  MCV 81.3 81.7  PLT 201 155     Recent Labs Lab 09/02/12 0724 09/02/12 1127 09/02/12 1649 09/02/12 2206 09/03/12 0643  GLUCAP 220* 183* 274* 250* 206*     Micro Results: Recent Results (from the past 240 hour(s))  MRSA PCR SCREENING     Status: None   Collection Time    08/26/12  9:49 AM      Result Value Range Status   MRSA by PCR NEGATIVE  NEGATIVE Final   Comment:            The GeneXpert MRSA Assay (FDA     approved for  NASAL specimens     only), is one component of a     comprehensive MRSA colonization     surveillance program. It is not     intended to diagnose MRSA     infection nor to guide or     monitor treatment for     MRSA infections.  URINE CULTURE     Status: None   Collection Time    09/01/12 11:22 AM      Result Value Range Status   Specimen Description URINE, CATHETERIZED   Final   Special Requests NONE   Final   Culture  Setup Time 09/01/2012 20:00   Final   Colony Count NO GROWTH   Final   Culture NO GROWTH   Final   Report Status 09/02/2012 FINAL   Final    Studies/Results: Dg Chest 1 View  08/26/2012   *RADIOLOGY REPORT*  Clinical Data: Right hip fracture.  CHEST - 1 VIEW  Comparison: 08/16/2012  Findings: Backboard artifact.  Stable appearance of postoperative changes in the mediastinum.  Cardiac enlargement with pulmonary vascular congestion.  Left pleural effusion is increasing in size, possibly due to the supine technique.  Atelectasis or consolidation in the left lung base.  IMPRESSION: Cardiac enlargement with left pleural effusion and atelectasis or infiltration in the left lung base.   Original Report Authenticated By: Burman Nieves, M.D.   Dg Chest 2 View  08/16/2012   *RADIOLOGY REPORT*  Clinical Data: Heart failure, history CHF, coronary artery disease post CABG, hypertension, diabetes, chronic renal insufficiency  CHEST - 2 VIEW  Comparison: 06/28/2012  Findings: Enlargement of cardiac silhouette post CABG. Pulmonary vascular congestion Calcified tortuous aorta. Moderate left pleural effusion with significant compressive atelectasis of lower left lung. Underlying infiltrate not excluded in this setting. Slight chronic accentuation perihilar markings grossly unchanged. Remaining lungs clear.  IMPRESSION: Enlargement of cardiac silhouette with pulmonary vascular congestion. Significant increase in left pleural effusion and left basilar atelectasis since prior exam.   Original  Report Authenticated By: Ulyses Southward, M.D.   Dg Hip 1 View Right  08/26/2012   *RADIOLOGY REPORT*  Clinical Data: Right hip pain after fall.  RIGHT HIP - 1 VIEW  Comparison: 08/26/2012 at 0526 hours.  Findings: Comminuted intertrochanteric fracture of the right hip with varus angulation, impaction, and displacement of the lesser trochanteric fragment.  No subluxation of the hip joints.  The pelvis appears intact.  No focal bone lesion.  Vascular calcifications.  IMPRESSION: Comminuted intertrochanteric fracture of the right hip with varus angulation and displaced lesser trochanteric fragment.   Original Report Authenticated By: Burman Nieves, M.D.   Dg Hip Complete Right  08/26/2012   *RADIOLOGY REPORT*  Clinical Data: Right-sided hip pain after fall.  RIGHT HIP - COMPLETE 2+ VIEW  Comparison: None.  Findings: Backboard artifact.  There is a comminuted fracture of the intertrochanteric region of the right hip with varus angulation of the hip and impaction and displacement of fracture fragments. Visualized pelvis, SI joints, and symphysis pubis appear intact. Degenerative changes in the lower lumbar spine and hips.  Vascular calcifications.  Diffuse bone demineralization.  IMPRESSION: Comminuted intertrochanteric fracture of the right hip with varus angulation and displacement of the lesser trochanteric fragment.   Original Report Authenticated By: Burman Nieves, M.D.   Dg Hip Operative Right  08/29/2012   *RADIOLOGY REPORT*  Clinical Data: Right hip fracture.  DG OPERATIVE RIGHT HIP  Comparison: 08/26/2012 radiographs.  Findings: Three spot fluoroscopic images of the right hip demonstrate compression screw and intramedullary nail fixation of the intertrochanteric right femur fracture.  There is near anatomic reduction of the main fracture fragments.  Hardware appears well positioned.  There is no dislocation.  IMPRESSION: Improved alignment of the intertrochanteric right femur fracture status post ORIF.    Original Report Authenticated By: Carey Bullocks, M.D.   Ct Head Wo Contrast  08/26/2012   *RADIOLOGY REPORT*  Clinical Data: The patient tripped and fell onto the right hip. Coumadin.  CT HEAD WITHOUT CONTRAST  Technique:  Contiguous axial images were obtained from the base of the skull through the vertex without contrast.  Comparison: 06/25/2012  Findings: Diffuse cerebral atrophy.  Low attenuation changes in the deep white matter consistent with central atrophy.  No ventricular dilatation.  Old lacunar infarct in the left basal ganglion.  No mass effect or midline shift.  No abnormal extra-axial fluid collections.  Gray-white matter junctions are distinct.  Basal cisterns are not effaced.  No evidence of acute intracranial hemorrhage.  Prominent vertebral basilar and carotid calcifications.  Small soft tissue hematoma over the right anterior frontal region.  Opacification of the left maxillary antrum is likely inflammatory.  Sinuses demonstrate clearing since previous study.  IMPRESSION: No acute intracranial abnormalities.  Chronic atrophy and small vessel ischemic changes.   Original Report Authenticated By: Burman Nieves, M.D.   Ct Chest Wo Contrast  08/26/2012   *RADIOLOGY REPORT*  Clinical Data: Evaluate for pleural effusion.  Fall with cervical spine and hip fractures.  CT CHEST WITHOUT CONTRAST  Technique:  Multidetector CT imaging of the chest was performed following the standard protocol without IV contrast.  Comparison: 06/27/2012.  Findings:  THORACIC INLET/BODY WALL:  Gynecomastia. Small thyroid nodules, considered incidental.  MEDIASTINUM:  Chronic cardiomegaly.  No pericardial effusion.  Extensive coronary atherosclerotic calcification, status post CABG.  Diffuse aortic and great vessel atherosclerotic calcification, without evidence of acute abnormality.  There mildly enlarged lymph nodes, most notably in the AP window, lower paratracheal, subcarinal stations.  These are largely stable  from prior and likely reactive.  LUNG WINDOWS:  Water density left pleural effusion which is mainly layering. Effusion is moderate in volume, although larger than previous. Compressive atelectasis of the left lower lobe and lingula.  No consolidation or edema detected.  Mild dependent atelectasis on the right.  UPPER ABDOMEN:  Cholecystectomy.  OSSEOUS:  No acute fracture.  Advanced left glenohumeral osteoarthritis with loose bodies.  IMPRESSION:   Moderate, simple left pleural effusion, mildly larger than 06/27/2012. There is compressive atelectasis of the left lower lobe and lingula.   Original Report Authenticated By: Tiburcio Pea   Ct Cervical Spine Wo Contrast  08/26/2012   *RADIOLOGY REPORT*  Clinical Data: The patient tripped and fell.  CT CERVICAL SPINE WITHOUT CONTRAST  Technique:  Multidetector CT  imaging of the cervical spine was performed. Multiplanar CT image reconstructions were also generated.  Comparison: 12/15/2008  Findings: There is a nonunited fracture of the base of the odontoid process with distraction of fracture fragments and with mild posterior subluxation of the odontoid fragment with respect to the base.  The fracture was demonstrated on the previous study but displacement and distraction was not present at that time.  The margins appear well corticated and this is likely chronic change although acute displacement is not entirely excluded.  Remainder of the cervical vertebrae and the facet joints are otherwise well- aligned.  Degenerative changes in the cervical spine with narrowed cervical interspaces and associated endplate hypertrophic changes throughout.  Postoperative versus degenerative fusion at C5-6. Degenerative changes are progressive since the previous study.  No vertebral compression deformities.  No prevertebral soft tissue swelling.  Lateral masses of C1 appear symmetrical.  Vascular calcifications in the cervical carotid vessels.  Incidental note of large pleural  effusion versus pleural thickening in the left apex and vascular prominence in the upper lungs suggesting vascular congestion.  Calcification of the aorta.  IMPRESSION: Old appearing ununited fracture of the base of the odontoid process of C2.  There is posterior subluxation of the odontoid process with respect to the body of C2 which is developed since the previous study but is likely chronic.  Diffuse degenerative changes.  No acute fractures are demonstrated in the cervical spine.  Large left pleural effusion versus pleural thickening noted in the apex.   Original Report Authenticated By: Burman Nieves, M.D.   [place] Chest  08/28/2012   *RADIOLOGY REPORT*  Clinical Data: Hip fracture.  Left pleural effusion.  CHEST ULTRASOUND  Comparison: CT 08/26/2012 and earlier studies  Findings: Survey ultrasound of the left hemithorax was performed. Moderate pleural effusion was demonstrated.  However, because of patient positioning limitations secondary to his pain and mobility restrictions, a safe window for percutaneous thoracentesis could not be achieved.  IMPRESSION:  1.  Moderate left pleural effusion.  No safe access could be achieved for thoracentesis due to pain and mobility issues.  We would be happy re-attempt once these issues are controlled.   Original Report Authenticated By: D. Andria Rhein, MD   Dg Chest Port 1 View  08/31/2012   *RADIOLOGY REPORT*  Clinical Data: Follow-up evaluation for pleural effusion.  PORTABLE CHEST - 1 VIEW  Comparison: Chest x-ray 08/29/2012.  Findings: Previously noted endotracheal and nasogastric tubes have been removed.  Lung volumes remain low.  There continues to be complete opacification at the base of the left hemithorax, compatible with areas of atelectasis and/or consolidation, with superimposed moderate left-sided pleural effusion which is essentially unchanged allowing for slight differences in patient positioning.  Mild crowding of the pulmonary vasculature, accentuated  by low lung volumes, without frank pulmonary edema. Heart size appears enlarged (unchanged).  Upper mediastinal contours are distorted by low lung volumes, kyphotic positioning and slight rotation to the left.  Atherosclerosis in the thoracic aorta.  Status post median sternotomy for CABG.  IMPRESSION: 1.  Support apparatus and postoperative changes, as above. 2.  Low lung volumes with persistent atelectasis and/or consolidation in the left lower lobe and moderate left-sided pleural effusion which is unchanged. 3.  Atherosclerosis.   Original Report Authenticated By: Trudie Reed, M.D.   Dg Chest Port 1 View  08/29/2012   *RADIOLOGY REPORT*  Clinical Data: Intubated.  PORTABLE CHEST - 1 VIEW  Comparison: 08/28/2012  Findings: Endotracheal tube is 2.8 cm above the  carina. Nasogastric tube is in the upper stomach region.  Left basilar densities are most compatible with pleural effusion and consolidation. Haziness at the right lung base may represent atelectasis.  Heart size appears to be upper limits of normal.  No evidence for a pneumothorax.  IMPRESSION: Support apparatuses as described.  Nasogastric tube in the upper stomach region.  Left basilar densities are suggestive for pleural fluid and consolidation.   Original Report Authenticated By: Richarda Overlie, M.D.   Dg Chest Port 1 View  08/28/2012   *RADIOLOGY REPORT*  Clinical Data: Follow up pleural effusion.  Shortness of breath.  PORTABLE CHEST - 1 VIEW  Comparison: Chest x-ray 08/26/2012.  Findings: No significant change in a large left-sided pleural effusion.  The majority of this pleural fluid is now throughout the mid and lower left hemithorax, with less fluid layering around the upper lung (this is likely positional as today's study is semi erect versus supine on the prior).  Extensive opacities throughout the left lung, particularly at the left base, likely reflect a combination of atelectasis and/or consolidation.  Mild pulmonary venous congestion,  without frank pulmonary edema.  Mild cardiomegaly is unchanged. The patient is rotated to the right on today's exam, resulting in distortion of the mediastinal contours and reduced diagnostic sensitivity and specificity for mediastinal pathology.  Atherosclerosis in the thoracic aorta.  Status post median sternotomy for CABG.  Surgical clips project over the right upper quadrant of the abdomen, likely from prior cholecystectomy. Advanced degenerative changes of osteoarthritis are incidentally noted in the left glenohumeral joint.  IMPRESSION: 1.  Allowing for differences in patient positioning, the radiographic appearance of chest is essentially unchanged, as detailed above.   Original Report Authenticated By: Trudie Reed, M.D.   Dg Chest Portable 1 View  08/26/2012   *RADIOLOGY REPORT*  Clinical Data: Hip pain after fall.  PORTABLE CHEST - 1 VIEW  Comparison: 08/26/2012 at 0531 hours  Findings: Shallow inspiration.  Cardiac enlargement with mild pulmonary vascular congestion.  Moderate to large left pleural effusion with consolidation in the left lung base.  No significant change.  IMPRESSION: Cardiac enlargement with pulmonary vascular congestion.  Left pleural effusion and basilar consolidation or atelectasis.   Original Report Authenticated By: Burman Nieves, M.D.    Medications: Scheduled Meds: . antiseptic oral rinse  15 mL Mouth Rinse q12n4p  . aspirin EC  325 mg Oral Daily  . docusate sodium  100 mg Oral BID  . feeding supplement  237 mL Oral BID BM  . furosemide  40 mg Oral Daily  . guaiFENesin  600 mg Oral BID  . insulin aspart  0-20 Units Subcutaneous TID WC  . insulin aspart  0-5 Units Subcutaneous QHS  . insulin glargine  20 Units Subcutaneous Daily  . nitroGLYCERIN  0.2 mg Transdermal Daily      LOS: 8 days   Seferina Brokaw M.D. [facility] 09/03/2012, 11:55 AM Pager: [phone]  If 7PM-7AM, please contact night-coverage [email or web address] Password [email or web address]

## 2012-09-03 NOTE — Progress Notes (Signed)
Pt refused soap suds enema because he said he was ready to have a bowel movement without an enema.  Pt had moderate sized bowel movement today.

## 2012-09-03 NOTE — Progress Notes (Signed)
Physical Therapy Treatment Patient Details Name: Wayne Montoya MRN: [id number] DOB: October 18, 1922 Today's Date: 09/03/2012 Time: 1344-1410 PT Time Calculation (min): 26 min  PT Assessment / Plan / Recommendation        PT Comments   Pt is not progressing toward PT goals. Pt unable to contribute physical effort to functional activities requiring +2 total assist. Pt has difficult time following commands and unable to problem solve therapeutic exercise. Pt will benefit from SNF to increase his functional abilities.   Follow Up Recommendations  SNF;Supervision/Assistance - 24 hour     Does the patient have the potential to tolerate intense rehabilitation     Barriers to Discharge        Equipment Recommendations  None recommended by PT    Recommendations for Other Services    Frequency Min 3X/week   Progress towards PT Goals Progress towards PT goals: Not progressing toward goals - comment  Plan      Precautions / Restrictions Precautions Precautions: Fall Required Braces or Orthoses: Cervical Brace Cervical Brace: Hard collar;Other (comment) Restrictions Weight Bearing Restrictions: Yes RLE Weight Bearing: Partial weight bearing RLE Partial Weight Bearing Percentage or Pounds: 25%-50%   Pertinent Vitals/Pain Made frequent c/o pain, but unable to articulate 0-10 pain scale.   Mobility  Bed Mobility Bed Mobility: Supine to Sit;Sitting - Scoot to Edge of Bed Supine to Sit: 1: +2 Total assist;HOB elevated;With rails Supine to Sit: Patient Percentage: 10% Sitting - Scoot to Edge of Bed: 1: +2 Total assist Sitting - Scoot to Edge of Bed: Patient Percentage: 10% Details for Bed Mobility Assistance: pt yells out in pain with (A) advancing to EOB; requires 2+ (A); decreased ability to follow one step commands; requires max encouragement to participate due to pain Transfers Transfers: Stand Pivot Transfers Stand Pivot Transfers: 1: +2 Total assist Stand Pivot Transfers: Patient  Percentage: 10% Details for Transfer Assistance: pt required +2 total assist to safely transfer from bed to chair. Pt offered very little help during transfer. Difficult to determine how much is due to cognitive impairments.  Ambulation/Gait Ambulation/Gait Assistance: Not tested (comment)    Exercises General Exercises - Lower Extremity Ankle Circles/Pumps: AROM;Both;20 reps;Seated      PT Goals (current goals can now be found in the care plan section) Acute Rehab PT Goals Time For Goal Achievement: 09/08/12 Potential to Achieve Goals: Fair  Visit Information  Last PT Received On: 09/03/12 Assistance Needed: +2 History of Present Illness: s/p ORIF R femur; hx AFIB, L pleural effusion, CVA and was adm in May 2014 due to PNA    Subjective Data      Cognition  Cognition Arousal/Alertness: Awake/alert Behavior During Therapy: WFL for tasks assessed/performed Overall Cognitive Status: History of cognitive impairments - at baseline    Balance  Balance Balance Assessed: Yes Static Sitting Balance Static Sitting - Balance Support: Bilateral upper extremity supported;Feet supported Static Sitting - Level of Assistance: 4: Min assist Static Sitting - Comment/# of Minutes: pt sat EOB ~4 min requiring min A frequently to maintain an upright postition. Pt continually leaned in posterior direction.  End of Session PT - End of Session Equipment Utilized During Treatment: Gait belt Activity Tolerance: Patient limited by fatigue;Patient limited by pain Patient left: in chair;with call bell/phone within reach;with chair alarm set Nurse Communication: Mobility status;Other (comment)   GP     Jolyn Nap, SPTA 09/03/2012, 2:38 PM

## 2012-09-03 NOTE — Progress Notes (Signed)
Patient ID: Wayne Montoya, male   DOB: [birth year], 79 y.o.   MRN: [id number] Subjective: 5 Days Post-Op Procedure(s) (LRB): INTRAMEDULLARY (IM) NAIL INTERTROCHANTRIC Right (Right)    Patient reports pain as mild.  Sitting in chair in room, appears comfortable but reports pain ans soreness in the right hip  Objective:   VITALS:   Filed Vitals:   09/03/12 1222  BP: 146/74  Pulse: 81  Temp: 97.9 F (36.6 C)  Resp: 16    Neurovascular intact Incision: dressing C/D/I  LABS  Recent Labs  09/01/12 1752 09/02/12 0810 09/03/12 0535  HGB 9.2* 8.8* 8.9*  HCT 28.4* 27.0* 28.2*  WBC 8.5 7.3 5.6  PLT 197 201 155     Recent Labs  09/01/12 0600 09/02/12 0620 09/03/12 0535  NA 141 136 136  K 3.7 3.7 4.0  BUN 74* 75* 70*  CREATININE 1.97* 2.02* 1.90*  GLUCOSE 156* 221* 233*     Recent Labs  09/01/12 0600 09/03/12 0535  INR 4.25* 1.62*     Assessment/Plan: 5 Days Post-Op Procedure(s) (LRB): INTRAMEDULLARY (IM) NAIL INTERTROCHANTRIC Right (Right)   Up with therapy Discharge to SNF when medically stable  Stable Orthopaedically for discharge PWB RLE until further directed in office  Daily dressing changes to right hip as needed, keep wound dry until follow up

## 2012-09-03 NOTE — Progress Notes (Signed)
SW spoke with patient's son and he is agreeable to accepting the bed offer at [facility].   Sabino Niemann, MSW,  [phone]

## 2012-09-03 NOTE — Progress Notes (Signed)
Occupational Therapy Discharge Patient Details Name: Wayne Montoya MRN: [id number] DOB: Aug 14, 1922 Today's Date: 09/03/2012 Time:  -     Patient discharged from OT services secondary to pt's plan is to go to SNF and pt is Medicare. OT eval not needed for Medicare SNF placement. Will defer OT eval to that facility.Evette Georges [phone] 09/03/2012, 7:58 AM

## 2012-09-04 LAB — BASIC METABOLIC PANEL
BUN: 54 mg/dL — ABNORMAL HIGH (ref 6–23)
Calcium: 9.1 mg/dL (ref 8.4–10.5)
Chloride: 101 mEq/L (ref 96–112)
Creatinine, Ser: 1.47 mg/dL — ABNORMAL HIGH (ref 0.50–1.35)
GFR calc Af Amer: 47 mL/min — ABNORMAL LOW (ref >90)

## 2012-09-04 LAB — CBC
HCT: 30 % — ABNORMAL LOW (ref 39.0–52.0)
MCHC: 32.3 g/dL (ref 30.0–36.0)
MCV: 81.7 fL (ref 78.0–100.0)
Platelets: 233 10*3/uL (ref 150–400)
RDW: 16.2 % — ABNORMAL HIGH (ref 11.5–15.5)
WBC: 7.5 10*3/uL (ref 4.0–10.5)

## 2012-09-04 LAB — GLUCOSE, CAPILLARY: Glucose-Capillary: 174 mg/dL — ABNORMAL HIGH (ref 70–99)

## 2012-09-04 MED ORDER — POTASSIUM CHLORIDE CRYS ER 20 MEQ PO TBCR: 10.0000 meq | EXTENDED_RELEASE_TABLET | Freq: Every day | ORAL | Status: AC

## 2012-09-04 MED ORDER — FUROSEMIDE 80 MG PO TABS: 40.0000 mg | ORAL_TABLET | Freq: Every day | ORAL | Status: AC

## 2012-09-04 MED ORDER — GUAIFENESIN ER 600 MG PO TB12: 600.0000 mg | ORAL_TABLET | Freq: Two times a day (BID) | ORAL | Status: AC

## 2012-09-04 MED ORDER — MENTHOL 3 MG MT LOZG: 1.0000 | LOZENGE | OROMUCOSAL | Status: AC | PRN

## 2012-09-04 MED ORDER — DSS 100 MG PO CAPS: 100.0000 mg | ORAL_CAPSULE | Freq: Two times a day (BID) | ORAL | Status: AC

## 2012-09-04 MED ORDER — ENSURE COMPLETE PO LIQD: 237.0000 mL | Freq: Three times a day (TID) | ORAL | Status: AC

## 2012-09-04 MED ORDER — POTASSIUM CHLORIDE CRYS ER 20 MEQ PO TBCR: 20.0000 meq | EXTENDED_RELEASE_TABLET | Freq: Every day | ORAL | Status: DC

## 2012-09-04 MED ORDER — ASPIRIN 325 MG PO TBEC: 325.0000 mg | DELAYED_RELEASE_TABLET | Freq: Every day | ORAL | Status: AC

## 2012-09-04 MED ORDER — POLYETHYLENE GLYCOL 3350 17 G PO PACK: 17.0000 g | PACK | Freq: Every day | ORAL | Status: AC | PRN

## 2012-09-04 MED ORDER — INSULIN GLARGINE 100 UNIT/ML ~~LOC~~ SOLN: 35.0000 [IU] | Freq: Every day | SUBCUTANEOUS | Status: AC

## 2012-09-04 MED ORDER — METOPROLOL SUCCINATE ER 25 MG PO TB24: 25.0000 mg | ORAL_TABLET | Freq: Every day | ORAL | Status: AC

## 2012-09-04 MED ORDER — INSULIN ASPART 100 UNIT/ML ~~LOC~~ SOLN: 4.0000 [IU] | Freq: Three times a day (TID) | SUBCUTANEOUS | Status: AC

## 2012-09-04 MED ORDER — INSULIN ASPART 100 UNIT/ML ~~LOC~~ SOLN: 0.0000 [IU] | Freq: Three times a day (TID) | SUBCUTANEOUS | Status: AC | PRN

## 2012-09-04 MED ORDER — HYDROCODONE-ACETAMINOPHEN 5-325 MG PO TABS: 1.0000 | ORAL_TABLET | Freq: Four times a day (QID) | ORAL | Status: AC | PRN

## 2012-09-04 NOTE — Progress Notes (Signed)
Seen and agreed 09/04/2012 Robinette, Julia Elizabeth PTA [phone] pager [phone] office    

## 2012-09-04 NOTE — Discharge Summary (Signed)
Physician Discharge Summary  Patient ID: Wayne Montoya MRN: [id number] DOB/AGE: March 05, 1922 79 y.o.  Admit date: 08/26/2012 Discharge date: 09/04/2012  Primary Care Physician:  Wilmer Floor., MD  Discharge Diagnoses:    . Atrial fibrillation . CAD, ARTERY BYPASS GRAFT . DIABETES MELLITUS, TYPE II . Pleural effusion, left . Acute respiratory failure with hypoxia . HYPERTENSION . Heparin induced thrombocytopenia (HIT) . CKD (chronic kidney disease) stage 3, GFR 30-59 ml/min . Chronic systolic heart failure/EF 30% . Right ventricular dilation  Consults: Cardiology                     Neurosurgery                     Pulmonology critical care                     Orthopedics, Dr. Charlann Boxer   Recommendations for Outpatient Follow-up:  1. per neurosurgery patient is supposed to wear his cervical collar as much as possible 2. please make followup appointment with Dr. Charlann Boxer in 3 weeks 3. please adjust Lantus and NovoLog insulin according to patient's blood sugar readings  Allergies:   Allergies  Allergen Reactions  . Hydralazine Hcl Other (See Comments)    Dizziness. Pt has been tolerating IV hydralazine this admit.  . Heparin Other (See Comments)    HIT per [facility]  . Moxifloxacin Other (See Comments)    Per [facility]  . Penicillins Other (See Comments)    Per [facility]  [facility]     Discharge Medications:   Medication List    STOP taking these medications       warfarin 5 MG tablet  Commonly known as:  COUMADIN     warfarin 7.5 MG tablet  Commonly known as:  COUMADIN      TAKE these medications       acetaminophen 500 MG tablet  Commonly known as:  TYLENOL  Take 1,000 mg by mouth every 4 (four) hours as needed for pain or fever.     alum & mag hydroxide-simeth 200-200-20 MG/5ML suspension  Commonly known as:  MAALOX/MYLANTA  Take 30 mLs by mouth every 6 (six) hours as needed for indigestion.     anti-nausea solution  Take 30 mLs by mouth every 30 (thirty) minutes as  needed for nausea (up to 4 times a day).     Artificial Tears 0.1-0.3 % Soln  Place 1 drop into both eyes 3 (three) times daily.     aspirin 325 MG EC tablet  Take 1 tablet (325 mg total) by mouth daily.     atorvastatin 80 MG tablet  Commonly known as:  LIPITOR  Take 80 mg by mouth daily.     cyanocobalamin 1000 MCG/ML injection  Commonly known as:  (VITAMIN B-12)  Inject 1 mL (1,000 mcg total) into the muscle once.     DSS 100 MG Caps  Take 100 mg by mouth 2 (two) times daily.     feeding supplement Liqd  Take 237 mLs by mouth 3 (three) times daily with meals.     furosemide 80 MG tablet  Commonly known as:  LASIX  Take 0.5 tablets (40 mg total) by mouth daily.     guaiFENesin 600 MG 12 hr tablet  Commonly known as:  MUCINEX  Take 1 tablet (600 mg total) by mouth 2 (two) times daily.     hydrALAZINE 25 MG tablet  Commonly known  as:  APRESOLINE  Take 1 tablet (25 mg total) by mouth 3 (three) times daily. Give at 8am, 2pm, and 8pm     HYDROcodone-acetaminophen 5-325 MG per tablet  Commonly known as:  NORCO  Take 1-2 tablets by mouth every 6 (six) hours as needed for pain.     insulin aspart 100 UNIT/ML injection  Commonly known as:  novoLOG  Inject 0-15 Units into the skin 3 (three) times daily with meals as needed. Sliding scale insulin CBG 70 - 120: 0 units: CBG 121 - 150: 2 units; CBG 151 - 200: 3 units; CBG 201 - 250: 5 units; CBG 251 - 300: 8 units;CBG 301 - 350: 11 units; CBG 351 - 400: 15 units; CBG > 400 : 15 units and Call MD     insulin aspart 100 UNIT/ML injection  Commonly known as:  novoLOG  Inject 4 Units into the skin 3 (three) times daily with meals.     insulin glargine 100 UNIT/ML injection  Commonly known as:  LANTUS  Inject 0.35 mLs (35 Units total) into the skin daily.     isosorbide mononitrate 30 MG 24 hr tablet  Commonly known as:  IMDUR  Take 1 tablet (30 mg total) by mouth daily.     loperamide 2 MG capsule  Commonly known as:  IMODIUM   Take 2 mg by mouth 4 (four) times daily as needed for diarrhea or loose stools.     magnesium hydroxide 400 MG/5ML suspension  Commonly known as:  MILK OF MAGNESIA  Take 30 mLs by mouth 2 (two) times daily as needed for constipation.     menthol-cetylpyridinium 3 MG lozenge  Commonly known as:  CEPACOL  Take 1 lozenge (3 mg total) by mouth as needed.     metoprolol succinate 25 MG 24 hr tablet  Commonly known as:  TOPROL XL  Take 1 tablet (25 mg total) by mouth daily.     multivitamin capsule  Take 1 capsule by mouth daily.     omeprazole 20 MG capsule  Commonly known as:  PRILOSEC  Take 20 mg by mouth daily.     polyethylene glycol packet  Commonly known as:  MIRALAX / GLYCOLAX  Take 17 g by mouth daily as needed.     potassium chloride SA 20 MEQ tablet  Commonly known as:  K-DUR,KLOR-CON  Take 0.5 tablets (10 mEq total) by mouth daily.     sodium phosphate enema  Commonly known as:  FLEET  Place 1 enema rectally as needed (if constipation not relieved by milk of magnesia). follow package directions         Brief H and P: For complete details please refer to admission H and P, but in brief AUSTINE WIEDEMAN is a 79 y.o. male brought from an ALF. Per records, he got up during the night, tripped and fell into his shower. Patient has an extensive past medical history:  A fib- on coumadin, left pleural effusion- recently tapped (5/15), old C2 fracture-2011- no surgery, EF 30-35% (follows with Dr. Jones Broom). He was recently hospitalized in May for PNA. In the ER, he was found to have a right hip fracture, a new C2 subluxation on top of C2 fracture. He had a reoccurrence of his left pleural effusion. Patient had no fevers or chills or any chest pain. He was mildly short of breath and complaining of pain in the right leg in ED.    Hospital Course:  79 year old male patient with  multiple medical problems. Recently discharged last month to an assisted living facility after  developing deconditioning during treatment for left pleural effusion related to chronic heart failure as well as treatment for healthcare acquired pneumonia. He was sent back to the emergency department after tripping and falling into his shower. Patient is noted to have an old C2 fracture in 2011 but this was not surgically repaired. Evaluation in the ER this admission showed a C2 subluxation on top of the old C2 fracture (unclear if acute or chronic), recurrence of his left pleural effusion, and an acute right hip fracture comminuted in nature.  Since admission patient has undergone preoperative evaluation by Cardiology who felt from a congestive heart failure standpoint the patient was compensated and appropriate for surgery placing him at a moderate risk for cardiovascular complications. Neurosurgery evaluated the patient because of the new subluxation at the C2 level and the recommendations were to place a hard cervical collar and to minimize any manipulation of the neck during intubation.  Acute respiratory failure with hypoxia due to Pleural effusion, left (recurrent)  - Currently stable, O2 sats in 90s on 3L. IR could not perform thoracentesis, per pulmonary, if clinically needed post op, repeat chest x-ray and can perform thoracentesis. CXR repeated on 09/03/12 showed stable Lt pleural effusion. Continue Lasix.   C2 cervical fracture (old) with subluxation: Patient was seen by neurosurgery during the hospitalization. And felt there is no indication to clinically repeat the old fracture. Per Dr. Gerlene Fee, continue hard cervical collar as much as possible however patient keeps taking it off.   Dehydration tolerating regular diet but not eating much, added nutritional supplements   Intertrochanteric fracture of right hip/post ORIF 7/16  -Patient has been followed by although, Dr Charlann Boxer, please make followup appointment in 3 weeks  DIABETES MELLITUS, TYPE II : Uncontrolled  -cont SSI , continue Lantus,  meal coverage, sliding scale insulin. Please adjust insulin schedule according to the blood sugar readings.  Atrial fibrillation / 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  -rate controlled, was on chronic coumadin but has known HIT so was bridging with Argatroban during the hospitalization. Patient had supratherapeutic INR however to get to that also can cause false elevation per pharmacy. The anticoagulation was discussed with patient's son, Chelsea Nusz, weighing the risk and benefits of Coumadin especially with his high fall risk and prior history of GI bleed, he requested to discontinue Coumadin at this time. He understands the risk of stroke due to atrial fibrillation and not on anticoagulation. INR today is 1.6. Per our discussion, placed patient on ASA 325mg  daily.   CKD (chronic kidney disease) stage 3, GFR 30-59 ml/min  -stable  HYPERTENSION  -BP stable today  CAD, Coronary ARTERY BYPASS GRAFT  -no sx's, moderate CV surgical risk per Cards evaluation  -cont BB, Imdur, aspirin 325 mg daily  Chronic systolic heart failure/EF 30%  -compensated, restart Lasix, monitor creatinine function  Right ventricular dilation  -as above     Day of Discharge BP 178/73  Pulse 81  Temp(Src) 98.1 F (36.7 C) (Oral)  Resp 18  Ht 5\' 10"  (1.778 m)  Wt 91.1 kg (200 lb 13.4 oz)  BMI 28.82 kg/m2  SpO2 97%  Physical Exam:  General: Alert and awake, NAD.  CVS: Irregularly irregular  Chest: dec BS at bases  Abdomen: soft NT, ND, NBS  Extremities: no cyanosis, clubbing bilaterally, TED hoses on   The results of significant diagnostics from this hospitalization (including imaging, microbiology, ancillary and laboratory) are listed  below for reference.    LAB RESULTS: Basic Metabolic Panel:  Recent Labs Lab 09/03/12 0535 09/04/12 0740  NA 136 137  K 4.0 3.7  CL 100 101  CO2 28 25  GLUCOSE 233* 198*  BUN 70* 54*  CREATININE 1.90* 1.47*  CALCIUM 8.8 9.1   Liver Function  Tests:  Recent Labs Lab 08/30/12 0545  AST 15  ALT 8  ALKPHOS 57  BILITOT 1.2  PROT 6.3  ALBUMIN 2.3*   No results found for this basename: LIPASE, AMYLASE,  in the last 168 hours No results found for this basename: AMMONIA,  in the last 168 hours CBC:  Recent Labs Lab 09/03/12 0535 09/04/12 0740  WBC 5.6 7.5  HGB 8.9* 9.7*  HCT 28.2* 30.0*  MCV 81.7 81.7  PLT 155 233   Cardiac Enzymes: No results found for this basename: CKTOTAL, CKMB, CKMBINDEX, TROPONINI,  in the last 168 hours BNP: No components found with this basename: POCBNP,  CBG:  Recent Labs Lab 09/04/12 0636 09/04/12 1111  GLUCAP 174* 290*    Significant Diagnostic Studies:  Dg Chest 1 View  08/26/2012   *RADIOLOGY REPORT*  Clinical Data: Right hip fracture.  CHEST - 1 VIEW  Comparison: 08/16/2012  Findings: Backboard artifact.  Stable appearance of postoperative changes in the mediastinum.  Cardiac enlargement with pulmonary vascular congestion.  Left pleural effusion is increasing in size, possibly due to the supine technique.  Atelectasis or consolidation in the left lung base.  IMPRESSION: Cardiac enlargement with left pleural effusion and atelectasis or infiltration in the left lung base.   Original Report Authenticated By: Burman Nieves, M.D.   Dg Hip 1 View Right  08/26/2012   *RADIOLOGY REPORT*  Clinical Data: Right hip pain after fall.  RIGHT HIP - 1 VIEW  Comparison: 08/26/2012 at 0526 hours.  Findings: Comminuted intertrochanteric fracture of the right hip with varus angulation, impaction, and displacement of the lesser trochanteric fragment.  No subluxation of the hip joints.  The pelvis appears intact.  No focal bone lesion.  Vascular calcifications.  IMPRESSION: Comminuted intertrochanteric fracture of the right hip with varus angulation and displaced lesser trochanteric fragment.   Original Report Authenticated By: Burman Nieves, M.D.   Dg Hip Complete Right  08/26/2012   *RADIOLOGY REPORT*   Clinical Data: Right-sided hip pain after fall.  RIGHT HIP - COMPLETE 2+ VIEW  Comparison: None.  Findings: Backboard artifact.  There is a comminuted fracture of the intertrochanteric region of the right hip with varus angulation of the hip and impaction and displacement of fracture fragments. Visualized pelvis, SI joints, and symphysis pubis appear intact. Degenerative changes in the lower lumbar spine and hips.  Vascular calcifications.  Diffuse bone demineralization.  IMPRESSION: Comminuted intertrochanteric fracture of the right hip with varus angulation and displacement of the lesser trochanteric fragment.   Original Report Authenticated By: Burman Nieves, M.D.   Ct Head Wo Contrast  08/26/2012   *RADIOLOGY REPORT*  Clinical Data: The patient tripped and fell onto the right hip. Coumadin.  CT HEAD WITHOUT CONTRAST  Technique:  Contiguous axial images were obtained from the base of the skull through the vertex without contrast.  Comparison: 06/25/2012  Findings: Diffuse cerebral atrophy.  Low attenuation changes in the deep white matter consistent with central atrophy.  No ventricular dilatation.  Old lacunar infarct in the left basal ganglion.  No mass effect or midline shift.  No abnormal extra-axial fluid collections.  Gray-white matter junctions are distinct.  Basal cisterns  are not effaced.  No evidence of acute intracranial hemorrhage.  Prominent vertebral basilar and carotid calcifications.  Small soft tissue hematoma over the right anterior frontal region.  Opacification of the left maxillary antrum is likely inflammatory.  Sinuses demonstrate clearing since previous study.  IMPRESSION: No acute intracranial abnormalities.  Chronic atrophy and small vessel ischemic changes.   Original Report Authenticated By: Burman Nieves, M.D.   Ct Chest Wo Contrast  08/26/2012   *RADIOLOGY REPORT*  Clinical Data: Evaluate for pleural effusion.  Fall with cervical spine and hip fractures.  CT CHEST WITHOUT  CONTRAST  Technique:  Multidetector CT imaging of the chest was performed following the standard protocol without IV contrast.  Comparison: 06/27/2012.  Findings:  THORACIC INLET/BODY WALL:  Gynecomastia. Small thyroid nodules, considered incidental.  MEDIASTINUM:  Chronic cardiomegaly.  No pericardial effusion.  Extensive coronary atherosclerotic calcification, status post CABG.  Diffuse aortic and great vessel atherosclerotic calcification, without evidence of acute abnormality.  There mildly enlarged lymph nodes, most notably in the AP window, lower paratracheal, subcarinal stations.  These are largely stable from prior and likely reactive.  LUNG WINDOWS:  Water density left pleural effusion which is mainly layering. Effusion is moderate in volume, although larger than previous. Compressive atelectasis of the left lower lobe and lingula.  No consolidation or edema detected.  Mild dependent atelectasis on the right.  UPPER ABDOMEN:  Cholecystectomy.  OSSEOUS:  No acute fracture.  Advanced left glenohumeral osteoarthritis with loose bodies.  IMPRESSION:   Moderate, simple left pleural effusion, mildly larger than 06/27/2012. There is compressive atelectasis of the left lower lobe and lingula.   Original Report Authenticated By: Tiburcio Pea   Ct Cervical Spine Wo Contrast  08/26/2012   *RADIOLOGY REPORT*  Clinical Data: The patient tripped and fell.  CT CERVICAL SPINE WITHOUT CONTRAST  Technique:  Multidetector CT imaging of the cervical spine was performed. Multiplanar CT image reconstructions were also generated.  Comparison: 12/15/2008  Findings: There is a nonunited fracture of the base of the odontoid process with distraction of fracture fragments and with mild posterior subluxation of the odontoid fragment with respect to the base.  The fracture was demonstrated on the previous study but displacement and distraction was not present at that time.  The margins appear well corticated and this is likely  chronic change although acute displacement is not entirely excluded.  Remainder of the cervical vertebrae and the facet joints are otherwise well- aligned.  Degenerative changes in the cervical spine with narrowed cervical interspaces and associated endplate hypertrophic changes throughout.  Postoperative versus degenerative fusion at C5-6. Degenerative changes are progressive since the previous study.  No vertebral compression deformities.  No prevertebral soft tissue swelling.  Lateral masses of C1 appear symmetrical.  Vascular calcifications in the cervical carotid vessels.  Incidental note of large pleural effusion versus pleural thickening in the left apex and vascular prominence in the upper lungs suggesting vascular congestion.  Calcification of the aorta.  IMPRESSION: Old appearing ununited fracture of the base of the odontoid process of C2.  There is posterior subluxation of the odontoid process with respect to the body of C2 which is developed since the previous study but is likely chronic.  Diffuse degenerative changes.  No acute fractures are demonstrated in the cervical spine.  Large left pleural effusion versus pleural thickening noted in the apex.   Original Report Authenticated By: Burman Nieves, M.D.   [place] Chest  08/28/2012   *RADIOLOGY REPORT*  Clinical Data: Hip fracture.  Left pleural effusion.  CHEST ULTRASOUND  Comparison: CT 08/26/2012 and earlier studies  Findings: Survey ultrasound of the left hemithorax was performed. Moderate pleural effusion was demonstrated.  However, because of patient positioning limitations secondary to his pain and mobility restrictions, a safe window for percutaneous thoracentesis could not be achieved.  IMPRESSION:  1.  Moderate left pleural effusion.  No safe access could be achieved for thoracentesis due to pain and mobility issues.  We would be happy re-attempt once these issues are controlled.   Original Report Authenticated By: D. Andria Rhein, MD   Dg  Chest Portable 1 View  08/26/2012   *RADIOLOGY REPORT*  Clinical Data: Hip pain after fall.  PORTABLE CHEST - 1 VIEW  Comparison: 08/26/2012 at 0531 hours  Findings: Shallow inspiration.  Cardiac enlargement with mild pulmonary vascular congestion.  Moderate to large left pleural effusion with consolidation in the left lung base.  No significant change.  IMPRESSION: Cardiac enlargement with pulmonary vascular congestion.  Left pleural effusion and basilar consolidation or atelectasis.   Original Report Authenticated By: Burman Nieves, M.D.      Disposition and Follow-up: Discharge Orders   Future Orders Complete By Expires     (HEART FAILURE PATIENTS) Call MD:  Anytime you have any of the following symptoms: 1) 3 pound weight gain in 24 hours or 5 pounds in 1 week 2) shortness of breath, with or without a dry hacking cough 3) swelling in the hands, feet or stomach 4) if you have to sleep on extra pillows at night in order to breathe.  As directed     Diet Carb Modified  As directed     Increase activity slowly  As directed     Partial weight bearing  As directed     Scheduling Instructions:      50%    Touch down weight bearing  As directed     Scheduling Instructions:      25%        DISPOSITION: Skilled nursing facility DIET: Carb modified diet  ACTIVITY: As tolerated    DISCHARGE FOLLOW-UP Follow-up Information   Follow up with Shelda Pal, MD In 3 weeks.   Contact information:   [address] [place] [address] [phone]       Follow up with CAMPBELL, Jeannett Senior D., MD. Schedule an appointment as soon as possible for a visit in 2 weeks.   Contact information:   [address] [place] [address] [phone]       Follow up with Reinaldo Meeker, MD. Schedule an appointment as soon as possible for a visit in 2 weeks. (As needed)    Contact information:   [address] [place] [address] [phone]        Follow up with Arvilla Meres, MD. Schedule an appointment as soon as possible for a visit in 10 days. (for CHF follow-up)    Contact information:   [address] [place] [address] [phone]       Time spent on Discharge: 40 mins  Signed:   Sareena Odeh M.D. [facility] 09/04/2012, 12:11 PM Pager: [phone]

## 2012-09-05 LAB — CBC
HCT: 28.7 % — ABNORMAL LOW (ref 39.0–52.0)
Hemoglobin: 9.1 g/dL — ABNORMAL LOW (ref 13.0–17.0)
MCV: 81.8 fL (ref 78.0–100.0)
RBC: 3.51 MIL/uL — ABNORMAL LOW (ref 4.22–5.81)
RDW: 16.5 % — ABNORMAL HIGH (ref 11.5–15.5)
WBC: 8.6 10*3/uL (ref 4.0–10.5)

## 2012-09-05 LAB — GLUCOSE, CAPILLARY: Glucose-Capillary: 218 mg/dL — ABNORMAL HIGH (ref 70–99)

## 2012-09-05 NOTE — Progress Notes (Signed)
Chart reviewed. Discussed with son, tech, SW.  Patient ID: Wayne Montoya  male  EXB:[id number]    DOB: 1922/08/18    DOA: 08/26/2012  PCP: Junious Dresser D., MD  Assessment/Plan:  Acute respiratory failure with hypoxia due to Pleural effusion, left (recurrent)  Repeat chest x-ray stable. Normal oxygen saturations on room air. 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 Fransico Him discussion with son, risk of Coumadin greater than benefit and patient will be maintained on aspirin for stroke prophylaxis . discussion, placed patient on ASA 325mg  daily.   CKD (chronic kidney disease) stage 3, GFR 30-59 ml/min  -stable   HYPERTENSION  -BP stable today  CAD, Coronary ARTERY BYPASS GRAFT  -no sx's, moderate CV surgical risk per Cards evaluation  -cont BB    Chronic systolic heart failure/EF 30%  -compensated, restart Lasix, monitor creatinine function   Right ventricular dilation  -as above   DVT prophylaxis:   Code Status:  DNR  Disposition: Stable for SNF when bed found.   Subjective: Urinating okay. Denies pain or nausea. No shortness of breath. Per nursing staff, takes health shakes well but not needed much of his meals.  Objective: Weight change:   Intake/Output Summary (Last 24 hours) at 09/05/12 1035 Last data filed at 09/05/12 0815  Gross per 24 hour  Intake      0 ml  Output   1725 ml  Net  -1725 ml   Blood pressure 154/78, pulse 83,  temperature 98.4 F (36.9 C), temperature source Oral, resp. rate 19, height 5\' 10"  (1.778 m), weight 91.1 kg (200 lb 13.4 oz), SpO2 97.00%.  Physical Exam: General: Alert and awake, NAD. CVS: Irregularly irregular Chest: dec BS at bases  Abdomen: soft NT, ND, NBS  Extremities: no cyanosis, clubbing bilaterally, SCDs in place  Lab Results: Basic Metabolic Panel:  Recent Labs Lab 09/03/12 0535 09/04/12 0740  NA 136 137  K 4.0 3.7  CL 100 101  CO2 28 25  GLUCOSE 233* 198*  BUN 70* 54*  CREATININE 1.90* 1.47*  CALCIUM 8.8 9.1   Liver Function Tests:  Recent Labs Lab 08/30/12 0545  AST 15  ALT 8  ALKPHOS 57  BILITOT 1.2  PROT 6.3  ALBUMIN 2.3*   No results found for this basename: LIPASE, AMYLASE,  in the last 168 hours No results found for this basename: AMMONIA,  in the last 168 hours CBC:  Recent Labs Lab 09/04/12 0740 09/05/12 0440  WBC 7.5 8.6  HGB 9.7* 9.1*  HCT 30.0* 28.7*  MCV 81.7 81.8  PLT 233 227     Recent Labs Lab 09/04/12 0636 09/04/12 1111 09/04/12 1613 09/04/12 2122 09/05/12 0638  GLUCAP 174* 290* 349* 73 145*     Micro Results: Recent Results (from the past 240 hour(s))  URINE CULTURE     Status: None   Collection Time    09/01/12 11:22 AM      Result Value Range Status   Specimen Description URINE, CATHETERIZED  Final   Special Requests NONE   Final   Culture  Setup Time 09/01/2012 20:00   Final   Colony Count NO GROWTH   Final   Culture NO GROWTH   Final   Report Status 09/02/2012 FINAL   Final    Studies/Results: Dg Chest 1 View  08/26/2012   *RADIOLOGY REPORT*  Clinical Data: Right hip fracture.  CHEST - 1 VIEW  Comparison: 08/16/2012  Findings: Backboard artifact.  Stable appearance of postoperative changes in the mediastinum.  Cardiac enlargement with pulmonary vascular congestion.  Left pleural effusion is increasing in size, possibly due to the supine technique.  Atelectasis or consolidation in the left lung base.   IMPRESSION: Cardiac enlargement with left pleural effusion and atelectasis or infiltration in the left lung base.   Original Report Authenticated By: Burman Nieves, M.D.   Dg Chest 2 View  08/16/2012   *RADIOLOGY REPORT*  Clinical Data: Heart failure, history CHF, coronary artery disease post CABG, hypertension, diabetes, chronic renal insufficiency  CHEST - 2 VIEW  Comparison: 06/28/2012  Findings: Enlargement of cardiac silhouette post CABG. Pulmonary vascular congestion Calcified tortuous aorta. Moderate left pleural effusion with significant compressive atelectasis of lower left lung. Underlying infiltrate not excluded in this setting. Slight chronic accentuation perihilar markings grossly unchanged. Remaining lungs clear.  IMPRESSION: Enlargement of cardiac silhouette with pulmonary vascular congestion. Significant increase in left pleural effusion and left basilar atelectasis since prior exam.   Original Report Authenticated By: Ulyses Southward, M.D.   Dg Hip 1 View Right  08/26/2012   *RADIOLOGY REPORT*  Clinical Data: Right hip pain after fall.  RIGHT HIP - 1 VIEW  Comparison: 08/26/2012 at 0526 hours.  Findings: Comminuted intertrochanteric fracture of the right hip with varus angulation, impaction, and displacement of the lesser trochanteric fragment.  No subluxation of the hip joints.  The pelvis appears intact.  No focal bone lesion.  Vascular calcifications.  IMPRESSION: Comminuted intertrochanteric fracture of the right hip with varus angulation and displaced lesser trochanteric fragment.   Original Report Authenticated By: Burman Nieves, M.D.   Dg Hip Complete Right  08/26/2012   *RADIOLOGY REPORT*  Clinical Data: Right-sided hip pain after fall.  RIGHT HIP - COMPLETE 2+ VIEW  Comparison: None.  Findings: Backboard artifact.  There is a comminuted fracture of the intertrochanteric region of the right hip with varus angulation of the hip and impaction and displacement of fracture fragments.  Visualized pelvis, SI joints, and symphysis pubis appear intact. Degenerative changes in the lower lumbar spine and hips.  Vascular calcifications.  Diffuse bone demineralization.  IMPRESSION: Comminuted intertrochanteric fracture of the right hip with varus angulation and displacement of the lesser trochanteric fragment.   Original Report Authenticated By: Burman Nieves, M.D.   Dg Hip Operative Right  08/29/2012   *RADIOLOGY REPORT*  Clinical Data: Right hip fracture.  DG OPERATIVE RIGHT HIP  Comparison: 08/26/2012 radiographs.  Findings: Three spot fluoroscopic images of the right hip demonstrate compression screw and intramedullary nail fixation of the intertrochanteric right femur fracture.  There is near anatomic reduction of the main fracture fragments.  Hardware appears well positioned.  There is no dislocation.  IMPRESSION: Improved alignment of the intertrochanteric right femur fracture status post ORIF.   Original Report Authenticated By: Carey Bullocks, M.D.   Ct Head Wo Contrast  08/26/2012   *RADIOLOGY REPORT*  Clinical Data: The patient tripped and fell onto the right hip. Coumadin.  CT HEAD WITHOUT CONTRAST  Technique:  Contiguous axial images were  obtained from the base of the skull through the vertex without contrast.  Comparison: 06/25/2012  Findings: Diffuse cerebral atrophy.  Low attenuation changes in the deep white matter consistent with central atrophy.  No ventricular dilatation.  Old lacunar infarct in the left basal ganglion.  No mass effect or midline shift.  No abnormal extra-axial fluid collections.  Gray-white matter junctions are distinct.  Basal cisterns are not effaced.  No evidence of acute intracranial hemorrhage.  Prominent vertebral basilar and carotid calcifications.  Small soft tissue hematoma over the right anterior frontal region.  Opacification of the left maxillary antrum is likely inflammatory.  Sinuses demonstrate clearing since previous study.  IMPRESSION: No  acute intracranial abnormalities.  Chronic atrophy and small vessel ischemic changes.   Original Report Authenticated By: Burman Nieves, M.D.   Ct Chest Wo Contrast  08/26/2012   *RADIOLOGY REPORT*  Clinical Data: Evaluate for pleural effusion.  Fall with cervical spine and hip fractures.  CT CHEST WITHOUT CONTRAST  Technique:  Multidetector CT imaging of the chest was performed following the standard protocol without IV contrast.  Comparison: 06/27/2012.  Findings:  THORACIC INLET/BODY WALL:  Gynecomastia. Small thyroid nodules, considered incidental.  MEDIASTINUM:  Chronic cardiomegaly.  No pericardial effusion.  Extensive coronary atherosclerotic calcification, status post CABG.  Diffuse aortic and great vessel atherosclerotic calcification, without evidence of acute abnormality.  There mildly enlarged lymph nodes, most notably in the AP window, lower paratracheal, subcarinal stations.  These are largely stable from prior and likely reactive.  LUNG WINDOWS:  Water density left pleural effusion which is mainly layering. Effusion is moderate in volume, although larger than previous. Compressive atelectasis of the left lower lobe and lingula.  No consolidation or edema detected.  Mild dependent atelectasis on the right.  UPPER ABDOMEN:  Cholecystectomy.  OSSEOUS:  No acute fracture.  Advanced left glenohumeral osteoarthritis with loose bodies.  IMPRESSION:   Moderate, simple left pleural effusion, mildly larger than 06/27/2012. There is compressive atelectasis of the left lower lobe and lingula.   Original Report Authenticated By: Tiburcio Pea   Ct Cervical Spine Wo Contrast  08/26/2012   *RADIOLOGY REPORT*  Clinical Data: The patient tripped and fell.  CT CERVICAL SPINE WITHOUT CONTRAST  Technique:  Multidetector CT imaging of the cervical spine was performed. Multiplanar CT image reconstructions were also generated.  Comparison: 12/15/2008  Findings: There is a nonunited fracture of the base of the  odontoid process with distraction of fracture fragments and with mild posterior subluxation of the odontoid fragment with respect to the base.  The fracture was demonstrated on the previous study but displacement and distraction was not present at that time.  The margins appear well corticated and this is likely chronic change although acute displacement is not entirely excluded.  Remainder of the cervical vertebrae and the facet joints are otherwise well- aligned.  Degenerative changes in the cervical spine with narrowed cervical interspaces and associated endplate hypertrophic changes throughout.  Postoperative versus degenerative fusion at C5-6. Degenerative changes are progressive since the previous study.  No vertebral compression deformities.  No prevertebral soft tissue swelling.  Lateral masses of C1 appear symmetrical.  Vascular calcifications in the cervical carotid vessels.  Incidental note of large pleural effusion versus pleural thickening in the left apex and vascular prominence in the upper lungs suggesting vascular congestion.  Calcification of the aorta.  IMPRESSION: Old appearing ununited fracture of the base of the odontoid process of C2.  There is posterior subluxation of the odontoid process  with respect to the body of C2 which is developed since the previous study but is likely chronic.  Diffuse degenerative changes.  No acute fractures are demonstrated in the cervical spine.  Large left pleural effusion versus pleural thickening noted in the apex.   Original Report Authenticated By: Burman Nieves, M.D.   [place] Chest  08/28/2012   *RADIOLOGY REPORT*  Clinical Data: Hip fracture.  Left pleural effusion.  CHEST ULTRASOUND  Comparison: CT 08/26/2012 and earlier studies  Findings: Survey ultrasound of the left hemithorax was performed. Moderate pleural effusion was demonstrated.  However, because of patient positioning limitations secondary to his pain and mobility restrictions, a safe window for  percutaneous thoracentesis could not be achieved.  IMPRESSION:  1.  Moderate left pleural effusion.  No safe access could be achieved for thoracentesis due to pain and mobility issues.  We would be happy re-attempt once these issues are controlled.   Original Report Authenticated By: D. Andria Rhein, MD   Dg Chest Port 1 View  08/31/2012   *RADIOLOGY REPORT*  Clinical Data: Follow-up evaluation for pleural effusion.  PORTABLE CHEST - 1 VIEW  Comparison: Chest x-ray 08/29/2012.  Findings: Previously noted endotracheal and nasogastric tubes have been removed.  Lung volumes remain low.  There continues to be complete opacification at the base of the left hemithorax, compatible with areas of atelectasis and/or consolidation, with superimposed moderate left-sided pleural effusion which is essentially unchanged allowing for slight differences in patient positioning.  Mild crowding of the pulmonary vasculature, accentuated by low lung volumes, without frank pulmonary edema. Heart size appears enlarged (unchanged).  Upper mediastinal contours are distorted by low lung volumes, kyphotic positioning and slight rotation to the left.  Atherosclerosis in the thoracic aorta.  Status post median sternotomy for CABG.  IMPRESSION: 1.  Support apparatus and postoperative changes, as above. 2.  Low lung volumes with persistent atelectasis and/or consolidation in the left lower lobe and moderate left-sided pleural effusion which is unchanged. 3.  Atherosclerosis.   Original Report Authenticated By: Trudie Reed, M.D.   Dg Chest Port 1 View  08/29/2012   *RADIOLOGY REPORT*  Clinical Data: Intubated.  PORTABLE CHEST - 1 VIEW  Comparison: 08/28/2012  Findings: Endotracheal tube is 2.8 cm above the carina. Nasogastric tube is in the upper stomach region.  Left basilar densities are most compatible with pleural effusion and consolidation. Haziness at the right lung base may represent atelectasis.  Heart size appears to be upper  limits of normal.  No evidence for a pneumothorax.  IMPRESSION: Support apparatuses as described.  Nasogastric tube in the upper stomach region.  Left basilar densities are suggestive for pleural fluid and consolidation.   Original Report Authenticated By: Richarda Overlie, M.D.   Dg Chest Port 1 View  08/28/2012   *RADIOLOGY REPORT*  Clinical Data: Follow up pleural effusion.  Shortness of breath.  PORTABLE CHEST - 1 VIEW  Comparison: Chest x-ray 08/26/2012.  Findings: No significant change in a large left-sided pleural effusion.  The majority of this pleural fluid is now throughout the mid and lower left hemithorax, with less fluid layering around the upper lung (this is likely positional as today's study is semi erect versus supine on the prior).  Extensive opacities throughout the left lung, particularly at the left base, likely reflect a combination of atelectasis and/or consolidation.  Mild pulmonary venous congestion, without frank pulmonary edema.  Mild cardiomegaly is unchanged. The patient is rotated to the right on today's exam, resulting in distortion of  the mediastinal contours and reduced diagnostic sensitivity and specificity for mediastinal pathology.  Atherosclerosis in the thoracic aorta.  Status post median sternotomy for CABG.  Surgical clips project over the right upper quadrant of the abdomen, likely from prior cholecystectomy. Advanced degenerative changes of osteoarthritis are incidentally noted in the left glenohumeral joint.  IMPRESSION: 1.  Allowing for differences in patient positioning, the radiographic appearance of chest is essentially unchanged, as detailed above.   Original Report Authenticated By: Trudie Reed, M.D.   Dg Chest Portable 1 View  08/26/2012   *RADIOLOGY REPORT*  Clinical Data: Hip pain after fall.  PORTABLE CHEST - 1 VIEW  Comparison: 08/26/2012 at 0531 hours  Findings: Shallow inspiration.  Cardiac enlargement with mild pulmonary vascular congestion.  Moderate to  large left pleural effusion with consolidation in the left lung base.  No significant change.  IMPRESSION: Cardiac enlargement with pulmonary vascular congestion.  Left pleural effusion and basilar consolidation or atelectasis.   Original Report Authenticated By: Burman Nieves, M.D.    Medications: Scheduled Meds: . antiseptic oral rinse  15 mL Mouth Rinse q12n4p  . aspirin EC  325 mg Oral Daily  . docusate sodium  100 mg Oral BID  . feeding supplement  237 mL Oral TID WC  . furosemide  40 mg Oral Daily  . guaiFENesin  600 mg Oral BID  . insulin aspart  0-20 Units Subcutaneous TID WC  . insulin aspart  0-5 Units Subcutaneous QHS  . insulin aspart  4 Units Subcutaneous TID WC  . insulin glargine  30 Units Subcutaneous Daily  . nitroGLYCERIN  0.2 mg Transdermal Daily      LOS: 10 days   Christiane Ha M.D. [facility] 09/05/2012, 10:35 AM Pager: [phone]  If 7PM-7AM, please contact night-coverage [email or web address] Password [email or web address]

## 2012-09-28 ENCOUNTER — Encounter: Payer: Medicare Other | Admitting: Nurse Practitioner

## 2012-10-04 ENCOUNTER — Encounter (HOSPITAL_COMMUNITY): Payer: Medicare Other

## 2013-03-17 DEATH — deceased

## 2013-04-16 ENCOUNTER — Telehealth: Payer: Self-pay

## 2013-04-16 NOTE — Telephone Encounter (Signed)
Patient past away per Obituary in [place] [employer] °

## 2014-08-27 IMAGING — CT CT CERVICAL SPINE W/O CM
4 of 5 series · 13 of 33 positions shown, 16 images · non-contrast
Comparison: 12/15/2008

CLINICAL DATA: The patient tripped and fell.

CT CERVICAL SPINE WITHOUT CONTRAST
TECHNIQUE: Multidetector CT imaging of the cervical spine was
performed. Multiplanar CT image reconstructions were also
generated.

[Series 4: c_spine 2.0 i30s 3 · axial · 0.38mm/px · z∈[+629,+757]mm · 5 of 98 slices shown, 7 images]
[im 17/98  soft-tissue]
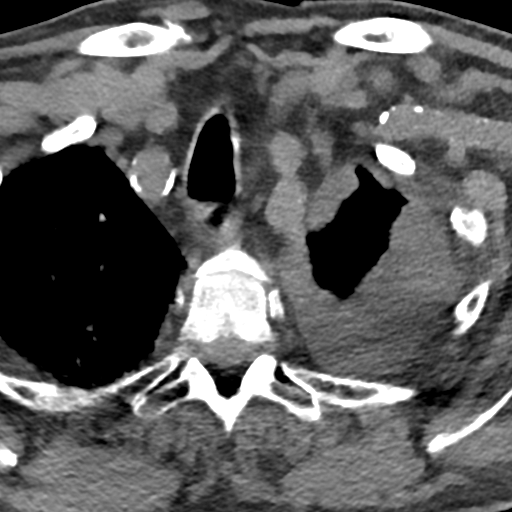
[im 17/98  bone]
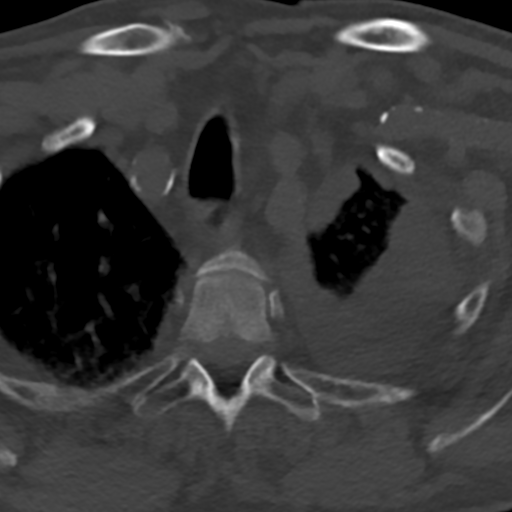
[im 33/98  bone]
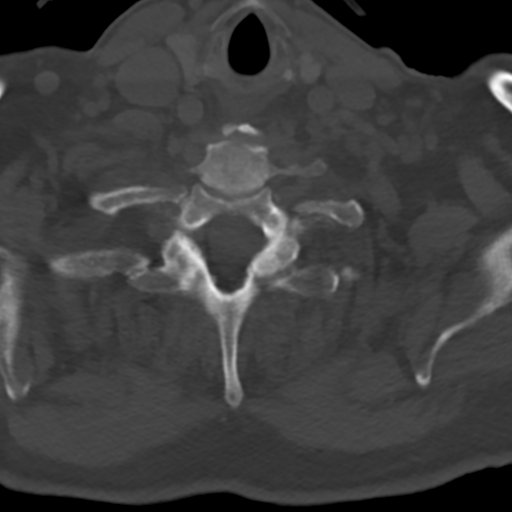
[im 49/98  bone]
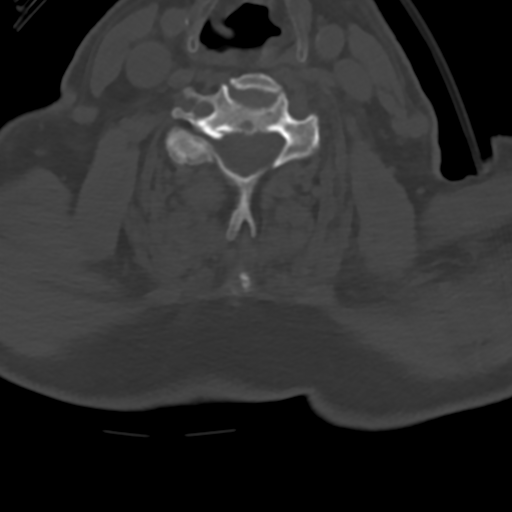
[im 65/98  bone]
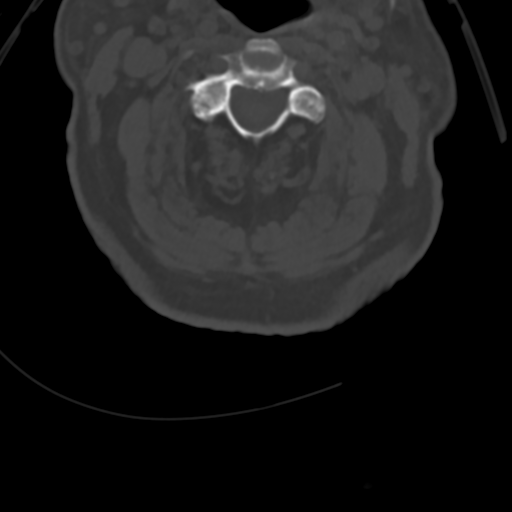
[im 81/98  soft-tissue]
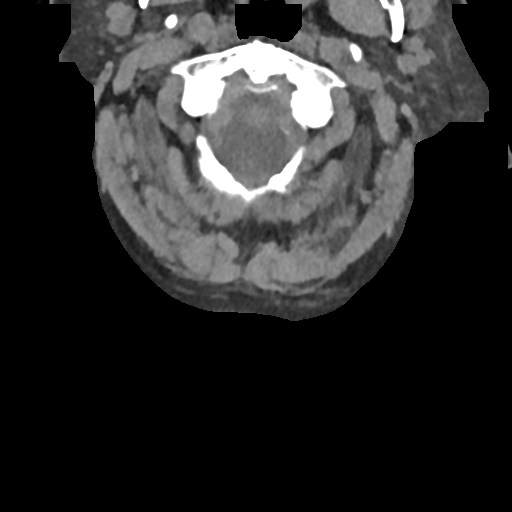
[im 81/98  bone]
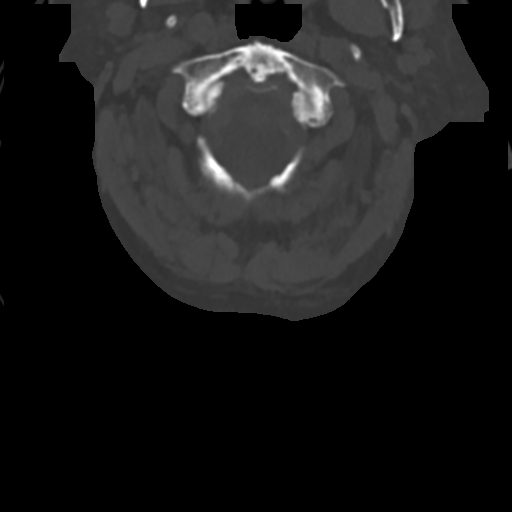

[Series 6: coronals · coronal · 0.35mm/px · 1 of 64 slices shown]
[im 32/64  bone]
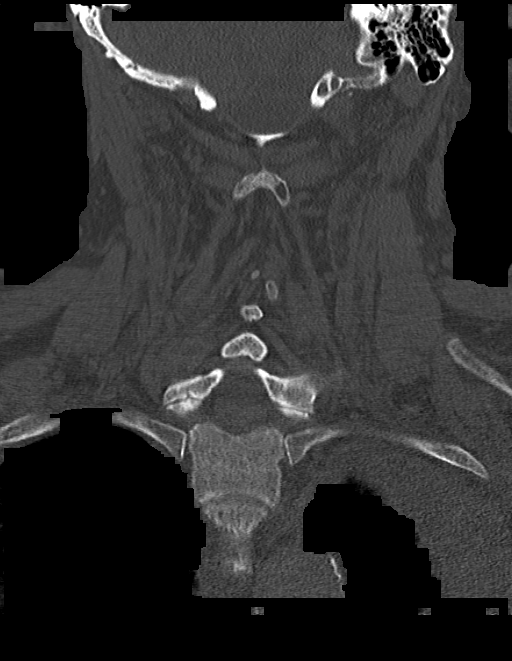

[Series 8: sagittals · sagittal · 0.33mm/px · 5 of 71 slices shown, 6 images]
[im 24/71  bone]
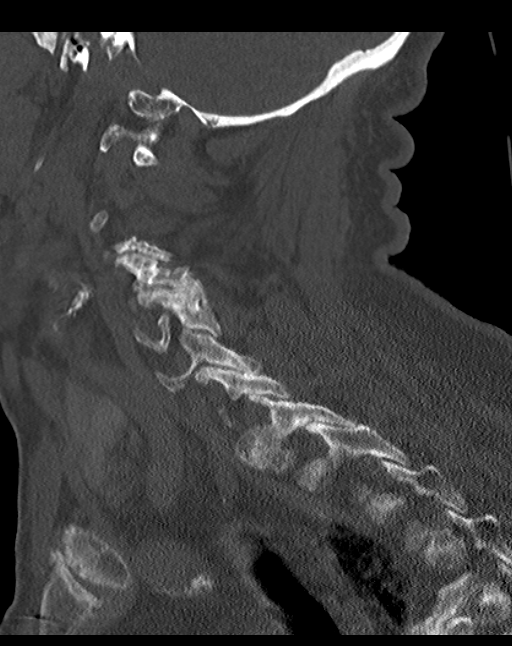
[im 30/71  bone]
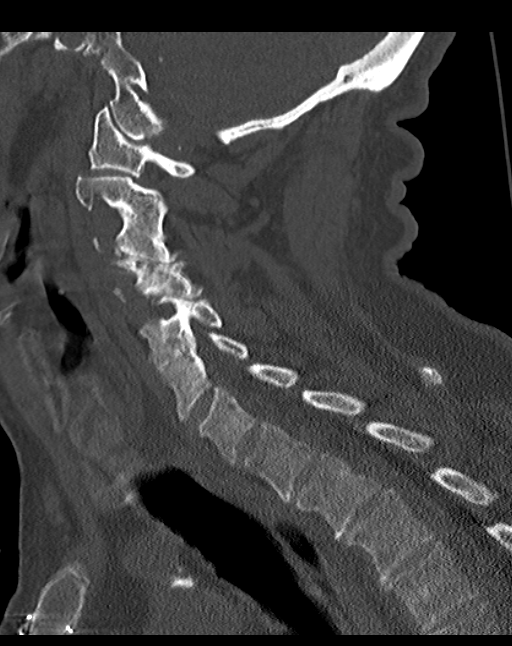
[im 36/71  soft-tissue]
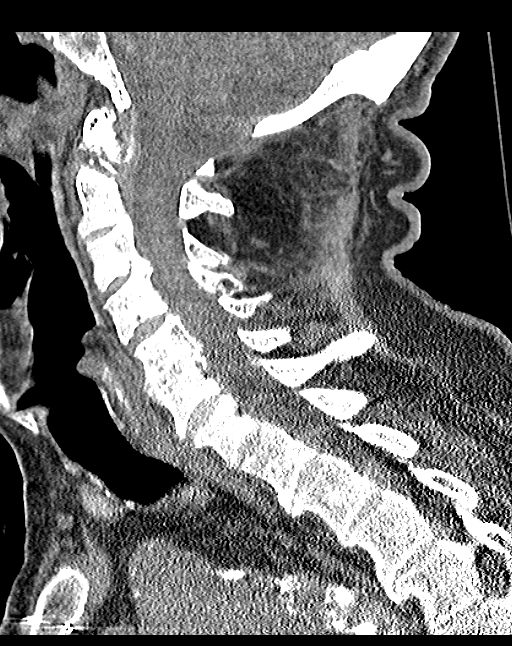
[im 36/71  bone]
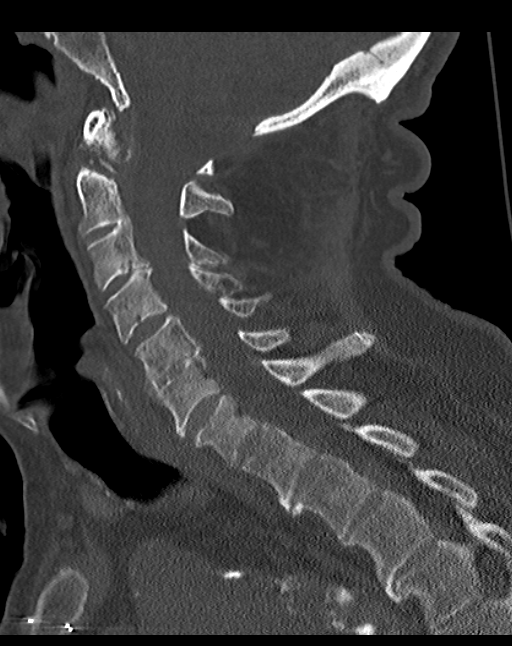
[im 41/71  bone]
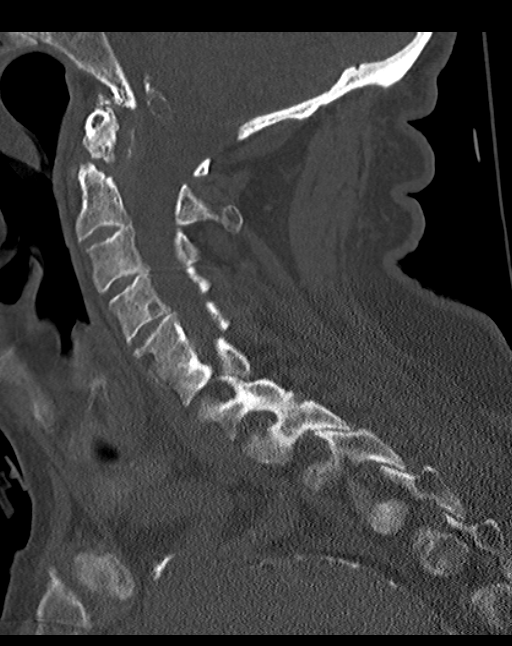
[im 47/71  bone]
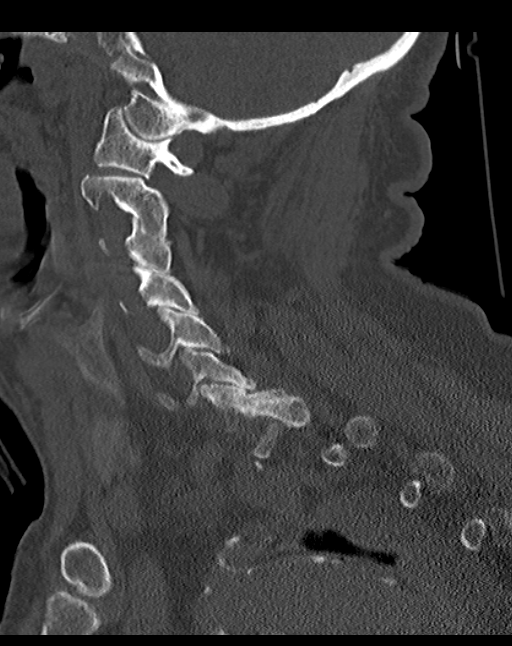

[Series 11: orthogonals upper · axial · 0.24mm/px · z∈[+652,+678]mm · 2 of 86 slices shown]
[im 15/86  bone]
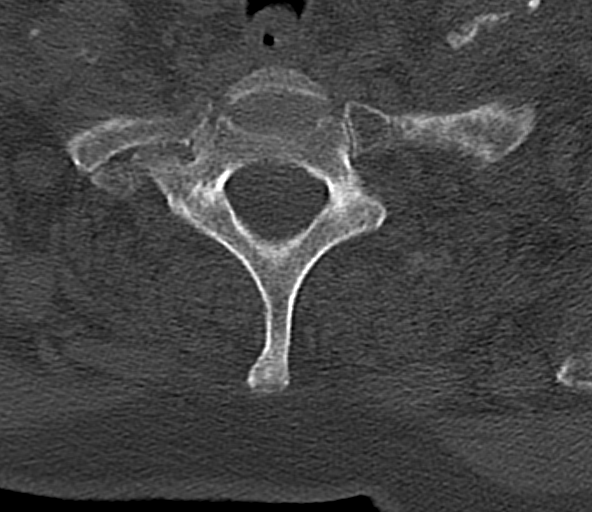
[im 29/86  bone]
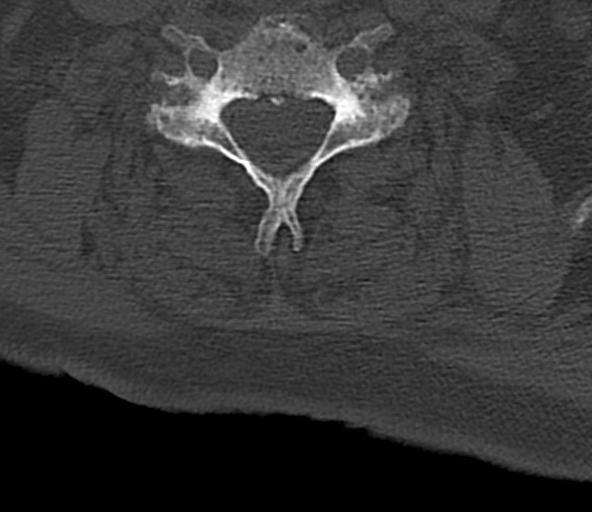

[13 of 33 positions shown; findings below may reference images not displayed]

FINDINGS: There is a nonunited fracture of the base of the odontoid
process with distraction of fracture fragments and with mild
posterior subluxation of the odontoid fragment with respect to the
base.  The fracture was demonstrated on the previous study but
displacement and distraction was not present at that time.  The
margins appear well corticated and this is likely chronic change
although acute displacement is not entirely excluded.  Remainder of
the cervical vertebrae and the facet joints are otherwise well-
aligned.  Degenerative changes in the cervical spine with narrowed
cervical interspaces and associated endplate hypertrophic changes
throughout.  Postoperative versus degenerative fusion at C5-6.
Degenerative changes are progressive since the previous study.  No
vertebral compression deformities.  No prevertebral soft tissue
swelling.  Lateral masses of C1 appear symmetrical.  Vascular
calcifications in the cervical carotid vessels.  Incidental note of
large pleural effusion versus pleural thickening in the left apex
and vascular prominence in the upper lungs suggesting vascular
congestion.  Calcification of the aorta.
IMPRESSION: Old appearing ununited fracture of the base of the odontoid process
of C2.  There is posterior subluxation of the odontoid process with
respect to the body of C2 which is developed since the previous
study but is likely chronic.  Diffuse degenerative changes.  No
acute fractures are demonstrated in the cervical spine.  Large left
pleural effusion versus pleural thickening noted in the apex.
